# Patient Record
Sex: Male | Born: 1955 | Race: White | Hispanic: No | Marital: Single | Smoking: Former smoker
Health system: Southern US, Community
[De-identification: ages and names within clinical notes are randomized; demographics above are authoritative.]

## PROBLEM LIST (undated history)

## (undated) DIAGNOSIS — J449 Chronic obstructive pulmonary disease, unspecified: Secondary | ICD-10-CM

## (undated) DIAGNOSIS — N400 Enlarged prostate without lower urinary tract symptoms: Secondary | ICD-10-CM

## (undated) DIAGNOSIS — K7460 Unspecified cirrhosis of liver: Secondary | ICD-10-CM

## (undated) DIAGNOSIS — Z8719 Personal history of other diseases of the digestive system: Secondary | ICD-10-CM

## (undated) DIAGNOSIS — I10 Essential (primary) hypertension: Secondary | ICD-10-CM

## (undated) DIAGNOSIS — G9340 Encephalopathy, unspecified: Secondary | ICD-10-CM

## (undated) DIAGNOSIS — E119 Type 2 diabetes mellitus without complications: Secondary | ICD-10-CM

## (undated) DIAGNOSIS — Z1211 Encounter for screening for malignant neoplasm of colon: Secondary | ICD-10-CM

## (undated) DIAGNOSIS — F32A Depression, unspecified: Secondary | ICD-10-CM

## (undated) DIAGNOSIS — G8929 Other chronic pain: Secondary | ICD-10-CM

## (undated) DIAGNOSIS — J9600 Acute respiratory failure, unspecified whether with hypoxia or hypercapnia: Secondary | ICD-10-CM

## (undated) DIAGNOSIS — R0600 Dyspnea, unspecified: Secondary | ICD-10-CM

## (undated) DIAGNOSIS — B182 Chronic viral hepatitis C: Secondary | ICD-10-CM

## (undated) DIAGNOSIS — B1920 Unspecified viral hepatitis C without hepatic coma: Secondary | ICD-10-CM

## (undated) DIAGNOSIS — F329 Major depressive disorder, single episode, unspecified: Secondary | ICD-10-CM

## (undated) DIAGNOSIS — M1990 Unspecified osteoarthritis, unspecified site: Secondary | ICD-10-CM

## (undated) DIAGNOSIS — K219 Gastro-esophageal reflux disease without esophagitis: Secondary | ICD-10-CM

## (undated) DIAGNOSIS — M549 Dorsalgia, unspecified: Secondary | ICD-10-CM

## (undated) DIAGNOSIS — F419 Anxiety disorder, unspecified: Secondary | ICD-10-CM

## (undated) DIAGNOSIS — J45909 Unspecified asthma, uncomplicated: Secondary | ICD-10-CM

## (undated) HISTORY — PX: LIVER BIOPSY: SHX301

## (undated) HISTORY — PX: OTHER SURGICAL HISTORY: SHX169

## (undated) HISTORY — PX: TONSILLECTOMY: SUR1361

---

## 1999-02-10 ENCOUNTER — Emergency Department (HOSPITAL_COMMUNITY): Admission: EM | Admit: 1999-02-10 | Discharge: 1999-02-10 | Payer: Self-pay | Admitting: Emergency Medicine

## 1999-02-17 ENCOUNTER — Emergency Department (HOSPITAL_COMMUNITY): Admission: EM | Admit: 1999-02-17 | Discharge: 1999-02-18 | Payer: Self-pay | Admitting: Family Medicine

## 1999-05-26 ENCOUNTER — Emergency Department (HOSPITAL_COMMUNITY): Admission: EM | Admit: 1999-05-26 | Discharge: 1999-05-27 | Payer: Self-pay

## 1999-05-31 ENCOUNTER — Emergency Department (HOSPITAL_COMMUNITY): Admission: EM | Admit: 1999-05-31 | Discharge: 1999-05-31 | Payer: Self-pay | Admitting: Emergency Medicine

## 1999-11-24 ENCOUNTER — Emergency Department (HOSPITAL_COMMUNITY): Admission: EM | Admit: 1999-11-24 | Discharge: 1999-11-24 | Payer: Self-pay | Admitting: Emergency Medicine

## 1999-11-24 ENCOUNTER — Encounter: Payer: Self-pay | Admitting: Emergency Medicine

## 2006-01-23 ENCOUNTER — Emergency Department (HOSPITAL_COMMUNITY): Admission: EM | Admit: 2006-01-23 | Discharge: 2006-01-23 | Payer: Self-pay | Admitting: Emergency Medicine

## 2011-09-18 ENCOUNTER — Emergency Department: Payer: Self-pay | Admitting: Internal Medicine

## 2014-02-08 ENCOUNTER — Ambulatory Visit: Payer: Self-pay | Admitting: Internal Medicine

## 2014-02-09 ENCOUNTER — Inpatient Hospital Stay: Payer: Self-pay | Admitting: Internal Medicine

## 2014-02-09 LAB — COMPREHENSIVE METABOLIC PANEL
Albumin: 3.1 g/dL — ABNORMAL LOW (ref 3.4–5.0)
Alkaline Phosphatase: 214 U/L — ABNORMAL HIGH
Anion Gap: 10 (ref 7–16)
BUN: 14 mg/dL (ref 7–18)
Bilirubin,Total: 1 mg/dL (ref 0.2–1.0)
Calcium, Total: 9 mg/dL (ref 8.5–10.1)
Chloride: 106 mmol/L (ref 98–107)
Co2: 23 mmol/L (ref 21–32)
Creatinine: 1.05 mg/dL (ref 0.60–1.30)
EGFR (African American): >60
EGFR (Non-African Amer.): >60
Glucose: 135 mg/dL — ABNORMAL HIGH (ref 65–99)
Osmolality: 280 (ref 275–301)
Potassium: 4.5 mmol/L (ref 3.5–5.1)
SGOT(AST): 175 U/L — ABNORMAL HIGH (ref 15–37)
SGPT (ALT): 183 U/L — ABNORMAL HIGH
Sodium: 139 mmol/L (ref 136–145)
Total Protein: 7.9 g/dL (ref 6.4–8.2)

## 2014-02-09 LAB — CBC
HCT: 44 % (ref 40.0–52.0)
HGB: 13.9 g/dL (ref 13.0–18.0)
MCH: 32.7 pg (ref 26.0–34.0)
MCHC: 31.6 g/dL — ABNORMAL LOW (ref 32.0–36.0)
MCV: 104 fL — ABNORMAL HIGH (ref 80–100)
Platelet: 140 10*3/uL — ABNORMAL LOW (ref 150–440)
RBC: 4.25 10*6/uL — ABNORMAL LOW (ref 4.40–5.90)
RDW: 14.2 % (ref 11.5–14.5)
WBC: 12.2 10*3/uL — ABNORMAL HIGH (ref 3.8–10.6)

## 2014-02-09 LAB — URINALYSIS, COMPLETE
BACTERIA: NONE SEEN
BLOOD: NEGATIVE
Bilirubin,UR: NEGATIVE
Glucose,UR: NEGATIVE mg/dL (ref 0–75)
Ketone: NEGATIVE
LEUKOCYTE ESTERASE: NEGATIVE
Nitrite: NEGATIVE
Ph: 6 (ref 4.5–8.0)
Specific Gravity: 1.014 (ref 1.003–1.030)
Squamous Epithelial: NONE SEEN

## 2014-02-09 LAB — PROTIME-INR
INR: 1.2
Prothrombin Time: 14.8 secs — ABNORMAL HIGH (ref 11.5–14.7)

## 2014-02-09 LAB — DRUG SCREEN, URINE
Amphetamines, Ur Screen: NEGATIVE (ref ?–1000)
BENZODIAZEPINE, UR SCRN: POSITIVE (ref ?–200)
Barbiturates, Ur Screen: NEGATIVE (ref ?–200)
CANNABINOID 50 NG, UR ~~LOC~~: NEGATIVE (ref ?–50)
Cocaine Metabolite,Ur ~~LOC~~: POSITIVE (ref ?–300)
MDMA (ECSTASY) UR SCREEN: NEGATIVE (ref ?–500)
METHADONE, UR SCREEN: POSITIVE (ref ?–300)
Opiate, Ur Screen: NEGATIVE (ref ?–300)
Phencyclidine (PCP) Ur S: NEGATIVE (ref ?–25)
Tricyclic, Ur Screen: NEGATIVE (ref ?–1000)

## 2014-02-09 LAB — ACETAMINOPHEN LEVEL: Acetaminophen: <2 ug/mL

## 2014-02-09 LAB — CK TOTAL AND CKMB (NOT AT ARMC)
CK, Total: 252 U/L (ref 39–308)
CK-MB: 8 ng/mL — ABNORMAL HIGH (ref 0.5–3.6)

## 2014-02-09 LAB — HEMOGLOBIN: HGB: 11.8 g/dL — ABNORMAL LOW (ref 13.0–18.0)

## 2014-02-09 LAB — MAGNESIUM: Magnesium: 2.5 mg/dL — ABNORMAL HIGH

## 2014-02-09 LAB — APTT: Activated PTT: 35.4 secs (ref 23.6–35.9)

## 2014-02-09 LAB — SALICYLATE LEVEL: Salicylates, Serum: 2.9 mg/dL — ABNORMAL HIGH

## 2014-02-09 LAB — PHOSPHORUS: PHOSPHORUS: 8.4 mg/dL — AB (ref 2.5–4.9)

## 2014-02-09 LAB — ETHANOL: Ethanol: <3 mg/dL

## 2014-02-10 LAB — CBC WITH DIFFERENTIAL/PLATELET
Basophil #: 0 10*3/uL (ref 0.0–0.1)
Basophil %: 0.2 %
EOS ABS: 0 10*3/uL (ref 0.0–0.7)
Eosinophil %: 0.1 %
HCT: 35 % — ABNORMAL LOW (ref 40.0–52.0)
HGB: 11.8 g/dL — ABNORMAL LOW (ref 13.0–18.0)
LYMPHS PCT: 7.4 %
Lymphocyte #: 0.5 10*3/uL — ABNORMAL LOW (ref 1.0–3.6)
MCH: 33.5 pg (ref 26.0–34.0)
MCHC: 33.9 g/dL (ref 32.0–36.0)
MCV: 99 fL (ref 80–100)
MONO ABS: 0.1 x10 3/mm — AB (ref 0.2–1.0)
Monocyte %: 1.5 %
Neutrophil #: 6.3 10*3/uL (ref 1.4–6.5)
Neutrophil %: 90.8 %
Platelet: 91 10*3/uL — ABNORMAL LOW (ref 150–440)
RBC: 3.54 10*6/uL — ABNORMAL LOW (ref 4.40–5.90)
RDW: 13.4 % (ref 11.5–14.5)
WBC: 6.9 10*3/uL (ref 3.8–10.6)

## 2014-02-10 LAB — BASIC METABOLIC PANEL
Anion Gap: 8 (ref 7–16)
BUN: 25 mg/dL — ABNORMAL HIGH (ref 7–18)
CHLORIDE: 108 mmol/L — AB (ref 98–107)
CO2: 27 mmol/L (ref 21–32)
Calcium, Total: 7.8 mg/dL — ABNORMAL LOW (ref 8.5–10.1)
Creatinine: 1.02 mg/dL (ref 0.60–1.30)
EGFR (African American): >60
EGFR (Non-African Amer.): >60
GLUCOSE: 141 mg/dL — AB (ref 65–99)
Osmolality: 292 (ref 275–301)
Potassium: 4 mmol/L (ref 3.5–5.1)
Sodium: 143 mmol/L (ref 136–145)

## 2014-02-11 LAB — CBC WITH DIFFERENTIAL/PLATELET
BASOS ABS: 0 10*3/uL (ref 0.0–0.1)
Basophil %: 0.2 %
Eosinophil #: 0 10*3/uL (ref 0.0–0.7)
Eosinophil %: 0.1 %
HCT: 34.6 % — AB (ref 40.0–52.0)
HGB: 11.7 g/dL — ABNORMAL LOW (ref 13.0–18.0)
Lymphocyte #: 0.5 10*3/uL — ABNORMAL LOW (ref 1.0–3.6)
Lymphocyte %: 2.9 %
MCH: 34.4 pg — ABNORMAL HIGH (ref 26.0–34.0)
MCHC: 33.8 g/dL (ref 32.0–36.0)
MCV: 102 fL — ABNORMAL HIGH (ref 80–100)
MONOS PCT: 2.5 %
Monocyte #: 0.4 x10 3/mm (ref 0.2–1.0)
NEUTROS ABS: 15.4 10*3/uL — AB (ref 1.4–6.5)
Neutrophil %: 94.3 %
Platelet: 104 10*3/uL — ABNORMAL LOW (ref 150–440)
RBC: 3.4 10*6/uL — ABNORMAL LOW (ref 4.40–5.90)
RDW: 13.9 % (ref 11.5–14.5)
WBC: 16.3 10*3/uL — ABNORMAL HIGH (ref 3.8–10.6)

## 2014-02-11 LAB — COMPREHENSIVE METABOLIC PANEL
ALK PHOS: 87 U/L
ALT: 119 U/L — AB
Albumin: 2.2 g/dL — ABNORMAL LOW (ref 3.4–5.0)
Anion Gap: 13 (ref 7–16)
BILIRUBIN TOTAL: 1.1 mg/dL — AB (ref 0.2–1.0)
BUN: 26 mg/dL — AB (ref 7–18)
CALCIUM: 7.1 mg/dL — AB (ref 8.5–10.1)
CHLORIDE: 107 mmol/L (ref 98–107)
CO2: 20 mmol/L — AB (ref 21–32)
Creatinine: 0.98 mg/dL (ref 0.60–1.30)
EGFR (African American): >60
GLUCOSE: 152 mg/dL — AB (ref 65–99)
Osmolality: 287 (ref 275–301)
POTASSIUM: 4 mmol/L (ref 3.5–5.1)
SGOT(AST): 107 U/L — ABNORMAL HIGH (ref 15–37)
SODIUM: 140 mmol/L (ref 136–145)
Total Protein: 5.8 g/dL — ABNORMAL LOW (ref 6.4–8.2)

## 2014-02-11 LAB — OCCULT BLOOD X 1 CARD TO LAB, STOOL: OCCULT BLOOD, FECES: POSITIVE

## 2014-02-11 LAB — MAGNESIUM: Magnesium: 3 mg/dL — ABNORMAL HIGH

## 2014-02-11 LAB — PHOSPHORUS: Phosphorus: 2.7 mg/dL (ref 2.5–4.9)

## 2014-02-11 LAB — VANCOMYCIN, TROUGH: Vancomycin, Trough: 16 ug/mL (ref 10–20)

## 2014-02-11 LAB — TRIGLYCERIDES: Triglycerides: 1147 mg/dL — ABNORMAL HIGH (ref 0–200)

## 2014-02-12 LAB — COMPREHENSIVE METABOLIC PANEL
ALK PHOS: 86 U/L
ALT: 114 U/L — AB
ANION GAP: 6 — AB (ref 7–16)
AST: 74 U/L — AB (ref 15–37)
Albumin: 2.3 g/dL — ABNORMAL LOW (ref 3.4–5.0)
BUN: 32 mg/dL — AB (ref 7–18)
Bilirubin,Total: 1.1 mg/dL — ABNORMAL HIGH (ref 0.2–1.0)
CHLORIDE: 109 mmol/L — AB (ref 98–107)
CO2: 28 mmol/L (ref 21–32)
CREATININE: 0.9 mg/dL (ref 0.60–1.30)
Calcium, Total: 7.8 mg/dL — ABNORMAL LOW (ref 8.5–10.1)
EGFR (African American): >60
GLUCOSE: 124 mg/dL — AB (ref 65–99)
Osmolality: 293 (ref 275–301)
Potassium: 4.3 mmol/L (ref 3.5–5.1)
Sodium: 143 mmol/L (ref 136–145)
Total Protein: 6.1 g/dL — ABNORMAL LOW (ref 6.4–8.2)

## 2014-02-12 LAB — CBC WITH DIFFERENTIAL/PLATELET
BASOS PCT: 0.5 %
Basophil #: 0.1 10*3/uL (ref 0.0–0.1)
Eosinophil #: 0 10*3/uL (ref 0.0–0.7)
Eosinophil %: 0 %
HCT: 35.4 % — AB (ref 40.0–52.0)
HGB: 11.5 g/dL — ABNORMAL LOW (ref 13.0–18.0)
LYMPHS ABS: 0.7 10*3/uL — AB (ref 1.0–3.6)
Lymphocyte %: 5.2 %
MCH: 32.8 pg (ref 26.0–34.0)
MCHC: 32.5 g/dL (ref 32.0–36.0)
MCV: 101 fL — ABNORMAL HIGH (ref 80–100)
Monocyte #: 0.9 x10 3/mm (ref 0.2–1.0)
Monocyte %: 6.5 %
NEUTROS ABS: 12.6 10*3/uL — AB (ref 1.4–6.5)
Neutrophil %: 87.8 %
PLATELETS: 96 10*3/uL — AB (ref 150–440)
RBC: 3.5 10*6/uL — ABNORMAL LOW (ref 4.40–5.90)
RDW: 14.1 % (ref 11.5–14.5)
WBC: 14.3 10*3/uL — ABNORMAL HIGH (ref 3.8–10.6)

## 2014-02-12 LAB — PHOSPHORUS: Phosphorus: 3.1 mg/dL (ref 2.5–4.9)

## 2014-02-12 LAB — MAGNESIUM: Magnesium: 2.4 mg/dL

## 2014-02-13 LAB — CBC WITH DIFFERENTIAL/PLATELET
BASOS ABS: 0.1 10*3/uL (ref 0.0–0.1)
Basophil %: 0.6 %
EOS ABS: 0 10*3/uL (ref 0.0–0.7)
EOS PCT: 0.2 %
HCT: 36.8 % — AB (ref 40.0–52.0)
HGB: 12.2 g/dL — ABNORMAL LOW (ref 13.0–18.0)
LYMPHS ABS: 1.8 10*3/uL (ref 1.0–3.6)
LYMPHS PCT: 19.6 %
MCH: 33.2 pg (ref 26.0–34.0)
MCHC: 33 g/dL (ref 32.0–36.0)
MCV: 101 fL — ABNORMAL HIGH (ref 80–100)
Monocyte #: 1.1 x10 3/mm — ABNORMAL HIGH (ref 0.2–1.0)
Monocyte %: 12.5 %
NEUTROS PCT: 67.1 %
Neutrophil #: 6 10*3/uL (ref 1.4–6.5)
Platelet: 91 10*3/uL — ABNORMAL LOW (ref 150–440)
RBC: 3.66 10*6/uL — ABNORMAL LOW (ref 4.40–5.90)
RDW: 14 % (ref 11.5–14.5)
WBC: 9 10*3/uL (ref 3.8–10.6)

## 2014-02-13 LAB — TRIGLYCERIDES: TRIGLYCERIDES: 63 mg/dL (ref 0–200)

## 2014-02-13 LAB — EXPECTORATED SPUTUM ASSESSMENT W REFEX TO RESP CULTURE

## 2014-02-14 LAB — COMPREHENSIVE METABOLIC PANEL
Albumin: 2.2 g/dL — ABNORMAL LOW (ref 3.4–5.0)
Alkaline Phosphatase: 94 U/L
Anion Gap: 6 — ABNORMAL LOW (ref 7–16)
BUN: 25 mg/dL — ABNORMAL HIGH (ref 7–18)
Bilirubin,Total: 1.2 mg/dL — ABNORMAL HIGH (ref 0.2–1.0)
Calcium, Total: 7.8 mg/dL — ABNORMAL LOW (ref 8.5–10.1)
Chloride: 109 mmol/L — ABNORMAL HIGH (ref 98–107)
Co2: 29 mmol/L (ref 21–32)
Creatinine: 0.72 mg/dL (ref 0.60–1.30)
EGFR (African American): >60
EGFR (Non-African Amer.): >60
Glucose: 155 mg/dL — ABNORMAL HIGH (ref 65–99)
Osmolality: 294 (ref 275–301)
Potassium: 4.3 mmol/L (ref 3.5–5.1)
SGOT(AST): 66 U/L — ABNORMAL HIGH (ref 15–37)
SGPT (ALT): 105 U/L — ABNORMAL HIGH
Sodium: 144 mmol/L (ref 136–145)
Total Protein: 6 g/dL — ABNORMAL LOW (ref 6.4–8.2)

## 2014-02-14 LAB — CULTURE, BLOOD (SINGLE)

## 2014-02-14 LAB — MAGNESIUM: Magnesium: 1.9 mg/dL

## 2014-02-15 LAB — BASIC METABOLIC PANEL
ANION GAP: 3 — AB (ref 7–16)
BUN: 20 mg/dL — ABNORMAL HIGH (ref 7–18)
CALCIUM: 7.8 mg/dL — AB (ref 8.5–10.1)
CHLORIDE: 108 mmol/L — AB (ref 98–107)
CO2: 29 mmol/L (ref 21–32)
Creatinine: 0.64 mg/dL (ref 0.60–1.30)
EGFR (African American): >60
EGFR (Non-African Amer.): >60
Glucose: 129 mg/dL — ABNORMAL HIGH (ref 65–99)
OSMOLALITY: 284 (ref 275–301)
POTASSIUM: 4.1 mmol/L (ref 3.5–5.1)
Sodium: 140 mmol/L (ref 136–145)

## 2014-02-15 LAB — MAGNESIUM: Magnesium: 1.8 mg/dL

## 2014-02-15 LAB — AMMONIA: Ammonia, Plasma: 102 mcmol/L — ABNORMAL HIGH (ref 11–32)

## 2014-02-16 LAB — CBC WITH DIFFERENTIAL/PLATELET
BASOS ABS: 0.1 10*3/uL (ref 0.0–0.1)
Basophil %: 0.5 %
EOS ABS: 0.4 10*3/uL (ref 0.0–0.7)
EOS PCT: 3.3 %
HCT: 38.8 % — ABNORMAL LOW (ref 40.0–52.0)
HGB: 12.7 g/dL — AB (ref 13.0–18.0)
LYMPHS PCT: 10.8 %
Lymphocyte #: 1.4 10*3/uL (ref 1.0–3.6)
MCH: 33.2 pg (ref 26.0–34.0)
MCHC: 32.6 g/dL (ref 32.0–36.0)
MCV: 102 fL — AB (ref 80–100)
MONOS PCT: 10.2 %
Monocyte #: 1.3 x10 3/mm — ABNORMAL HIGH (ref 0.2–1.0)
NEUTROS ABS: 9.5 10*3/uL — AB (ref 1.4–6.5)
Neutrophil %: 75.2 %
Platelet: 103 10*3/uL — ABNORMAL LOW (ref 150–440)
RBC: 3.82 10*6/uL — ABNORMAL LOW (ref 4.40–5.90)
RDW: 13.8 % (ref 11.5–14.5)
WBC: 12.6 10*3/uL — AB (ref 3.8–10.6)

## 2014-02-16 LAB — CREATININE, SERUM
Creatinine: 0.75 mg/dL (ref 0.60–1.30)
EGFR (African American): >60
EGFR (Non-African Amer.): >60

## 2014-02-16 LAB — AMMONIA: Ammonia, Plasma: 60 mcmol/L — ABNORMAL HIGH (ref 11–32)

## 2014-02-17 LAB — CREATININE, SERUM
Creatinine: 0.68 mg/dL (ref 0.60–1.30)
EGFR (Non-African Amer.): >60

## 2014-02-19 LAB — COMPREHENSIVE METABOLIC PANEL
ALT: 91 U/L — AB
Albumin: 2 g/dL — ABNORMAL LOW (ref 3.4–5.0)
Alkaline Phosphatase: 101 U/L
Anion Gap: 7 (ref 7–16)
BUN: 33 mg/dL — ABNORMAL HIGH (ref 7–18)
Bilirubin,Total: 1.1 mg/dL — ABNORMAL HIGH (ref 0.2–1.0)
Calcium, Total: 7.8 mg/dL — ABNORMAL LOW (ref 8.5–10.1)
Chloride: 113 mmol/L — ABNORMAL HIGH (ref 98–107)
Co2: 24 mmol/L (ref 21–32)
Creatinine: 1.27 mg/dL (ref 0.60–1.30)
EGFR (African American): >60
EGFR (Non-African Amer.): >60
Glucose: 154 mg/dL — ABNORMAL HIGH (ref 65–99)
Osmolality: 297 (ref 275–301)
Potassium: 3.5 mmol/L (ref 3.5–5.1)
SGOT(AST): 76 U/L — ABNORMAL HIGH (ref 15–37)
Sodium: 144 mmol/L (ref 136–145)
TOTAL PROTEIN: 5.8 g/dL — AB (ref 6.4–8.2)

## 2014-02-19 LAB — CBC WITH DIFFERENTIAL/PLATELET
Basophil #: 0 10*3/uL (ref 0.0–0.1)
Basophil %: 0.4 %
Eosinophil #: 0.4 10*3/uL (ref 0.0–0.7)
Eosinophil %: 4.4 %
HCT: 38.9 % — ABNORMAL LOW (ref 40.0–52.0)
HGB: 13 g/dL (ref 13.0–18.0)
LYMPHS ABS: 1.3 10*3/uL (ref 1.0–3.6)
Lymphocyte %: 12.9 %
MCH: 34 pg (ref 26.0–34.0)
MCHC: 33.5 g/dL (ref 32.0–36.0)
MCV: 102 fL — ABNORMAL HIGH (ref 80–100)
MONOS PCT: 12.3 %
Monocyte #: 1.2 x10 3/mm — ABNORMAL HIGH (ref 0.2–1.0)
Neutrophil #: 7.1 10*3/uL — ABNORMAL HIGH (ref 1.4–6.5)
Neutrophil %: 70 %
Platelet: 89 10*3/uL — ABNORMAL LOW (ref 150–440)
RBC: 3.83 10*6/uL — AB (ref 4.40–5.90)
RDW: 13.8 % (ref 11.5–14.5)
WBC: 10.1 10*3/uL (ref 3.8–10.6)

## 2014-02-19 LAB — MAGNESIUM: MAGNESIUM: 1.9 mg/dL

## 2014-02-20 LAB — COMPREHENSIVE METABOLIC PANEL
ANION GAP: 4 — AB (ref 7–16)
AST: 89 U/L — AB (ref 15–37)
Albumin: 1.9 g/dL — ABNORMAL LOW (ref 3.4–5.0)
Alkaline Phosphatase: 96 U/L
BUN: 22 mg/dL — AB (ref 7–18)
Bilirubin,Total: 0.8 mg/dL (ref 0.2–1.0)
CALCIUM: 7.2 mg/dL — AB (ref 8.5–10.1)
CO2: 23 mmol/L (ref 21–32)
CREATININE: 1.14 mg/dL (ref 0.60–1.30)
Chloride: 122 mmol/L — ABNORMAL HIGH (ref 98–107)
EGFR (Non-African Amer.): >60
GLUCOSE: 401 mg/dL — AB (ref 65–99)
Osmolality: 316 (ref 275–301)
Potassium: 5.1 mmol/L (ref 3.5–5.1)
SGPT (ALT): 100 U/L — ABNORMAL HIGH
SODIUM: 149 mmol/L — AB (ref 136–145)
Total Protein: 5.1 g/dL — ABNORMAL LOW (ref 6.4–8.2)

## 2014-02-20 LAB — PLATELET COUNT: PLATELETS: 82 10*3/uL — AB (ref 150–440)

## 2014-02-21 LAB — COMPREHENSIVE METABOLIC PANEL
ALBUMIN: 2 g/dL — AB (ref 3.4–5.0)
ALK PHOS: 139 U/L — AB
ALT: 113 U/L — AB
ANION GAP: 4 — AB (ref 7–16)
AST: 87 U/L — AB (ref 15–37)
BUN: 18 mg/dL (ref 7–18)
Bilirubin,Total: 0.9 mg/dL (ref 0.2–1.0)
CREATININE: 1.09 mg/dL (ref 0.60–1.30)
Calcium, Total: 8.3 mg/dL — ABNORMAL LOW (ref 8.5–10.1)
Chloride: 115 mmol/L — ABNORMAL HIGH (ref 98–107)
Co2: 26 mmol/L (ref 21–32)
EGFR (African American): >60
EGFR (Non-African Amer.): >60
GLUCOSE: 155 mg/dL — AB (ref 65–99)
OSMOLALITY: 294 (ref 275–301)
Potassium: 3.7 mmol/L (ref 3.5–5.1)
Sodium: 145 mmol/L (ref 136–145)
Total Protein: 5.8 g/dL — ABNORMAL LOW (ref 6.4–8.2)

## 2014-02-21 LAB — HEMOGLOBIN: HGB: 13.1 g/dL (ref 13.0–18.0)

## 2014-02-22 LAB — PROTIME-INR
INR: 1.2
Prothrombin Time: 15.4 secs — ABNORMAL HIGH (ref 11.5–14.7)

## 2014-02-23 LAB — CBC WITH DIFFERENTIAL/PLATELET
BASOS ABS: 0.1 10*3/uL (ref 0.0–0.1)
Basophil %: 0.7 %
Eosinophil #: 0.2 10*3/uL (ref 0.0–0.7)
Eosinophil %: 1 %
HCT: 42.3 % (ref 40.0–52.0)
HGB: 14.1 g/dL (ref 13.0–18.0)
LYMPHS ABS: 1.5 10*3/uL (ref 1.0–3.6)
LYMPHS PCT: 8.9 %
MCH: 33.4 pg (ref 26.0–34.0)
MCHC: 33.3 g/dL (ref 32.0–36.0)
MCV: 100 fL (ref 80–100)
MONO ABS: 1.3 x10 3/mm — AB (ref 0.2–1.0)
Monocyte %: 7.8 %
Neutrophil #: 13.5 10*3/uL — ABNORMAL HIGH (ref 1.4–6.5)
Neutrophil %: 81.6 %
PLATELETS: 98 10*3/uL — AB (ref 150–440)
RBC: 4.22 10*6/uL — ABNORMAL LOW (ref 4.40–5.90)
RDW: 13.8 % (ref 11.5–14.5)
WBC: 16.5 10*3/uL — AB (ref 3.8–10.6)

## 2014-02-24 LAB — COMPREHENSIVE METABOLIC PANEL
ALBUMIN: 1.9 g/dL — AB (ref 3.4–5.0)
ALT: 137 U/L — AB
Alkaline Phosphatase: 159 U/L — ABNORMAL HIGH
Anion Gap: 7 (ref 7–16)
BUN: 13 mg/dL (ref 7–18)
Bilirubin,Total: 1.7 mg/dL — ABNORMAL HIGH (ref 0.2–1.0)
CALCIUM: 7.4 mg/dL — AB (ref 8.5–10.1)
CO2: 30 mmol/L (ref 21–32)
Chloride: 92 mmol/L — ABNORMAL LOW (ref 98–107)
Creatinine: 1.16 mg/dL (ref 0.60–1.30)
EGFR (African American): >60
GLUCOSE: 551 mg/dL — AB (ref 65–99)
Osmolality: 284 (ref 275–301)
Potassium: 2.8 mmol/L — ABNORMAL LOW (ref 3.5–5.1)
SGOT(AST): 141 U/L — ABNORMAL HIGH (ref 15–37)
Sodium: 129 mmol/L — ABNORMAL LOW (ref 136–145)
Total Protein: 5.4 g/dL — ABNORMAL LOW (ref 6.4–8.2)

## 2014-02-24 LAB — MAGNESIUM: MAGNESIUM: 1.3 mg/dL — AB

## 2014-02-25 LAB — COMPREHENSIVE METABOLIC PANEL
ALT: 159 U/L — AB
AST: 162 U/L — AB (ref 15–37)
Albumin: 2.1 g/dL — ABNORMAL LOW (ref 3.4–5.0)
Alkaline Phosphatase: 176 U/L — ABNORMAL HIGH
Anion Gap: 6 — ABNORMAL LOW (ref 7–16)
BUN: 15 mg/dL (ref 7–18)
Bilirubin,Total: 1.7 mg/dL — ABNORMAL HIGH (ref 0.2–1.0)
CREATININE: 1.29 mg/dL (ref 0.60–1.30)
Calcium, Total: 8.4 mg/dL — ABNORMAL LOW (ref 8.5–10.1)
Chloride: 107 mmol/L (ref 98–107)
Co2: 33 mmol/L — ABNORMAL HIGH (ref 21–32)
EGFR (Non-African Amer.): >60
Glucose: 81 mg/dL (ref 65–99)
Osmolality: 290 (ref 275–301)
POTASSIUM: 3.5 mmol/L (ref 3.5–5.1)
Sodium: 146 mmol/L — ABNORMAL HIGH (ref 136–145)
TOTAL PROTEIN: 6 g/dL — AB (ref 6.4–8.2)

## 2014-02-25 LAB — CBC WITH DIFFERENTIAL/PLATELET
Basophil #: 0.1 10*3/uL (ref 0.0–0.1)
Basophil %: 0.8 %
EOS PCT: 5 %
Eosinophil #: 0.6 10*3/uL (ref 0.0–0.7)
HCT: 41.2 % (ref 40.0–52.0)
HGB: 13.6 g/dL (ref 13.0–18.0)
Lymphocyte #: 2.8 10*3/uL (ref 1.0–3.6)
Lymphocyte %: 25.6 %
MCH: 33.3 pg (ref 26.0–34.0)
MCHC: 33 g/dL (ref 32.0–36.0)
MCV: 101 fL — AB (ref 80–100)
MONOS PCT: 13.6 %
Monocyte #: 1.5 x10 3/mm — ABNORMAL HIGH (ref 0.2–1.0)
Neutrophil #: 6 10*3/uL (ref 1.4–6.5)
Neutrophil %: 55 %
Platelet: 93 10*3/uL — ABNORMAL LOW (ref 150–440)
RBC: 4.07 10*6/uL — ABNORMAL LOW (ref 4.40–5.90)
RDW: 13.6 % (ref 11.5–14.5)
WBC: 11 10*3/uL — AB (ref 3.8–10.6)

## 2014-03-01 LAB — BASIC METABOLIC PANEL
ANION GAP: 3 — AB (ref 7–16)
BUN: 23 mg/dL — ABNORMAL HIGH (ref 7–18)
CHLORIDE: 103 mmol/L (ref 98–107)
Calcium, Total: 7.9 mg/dL — ABNORMAL LOW (ref 8.5–10.1)
Co2: 35 mmol/L — ABNORMAL HIGH (ref 21–32)
Creatinine: 1.02 mg/dL (ref 0.60–1.30)
EGFR (African American): >60
EGFR (Non-African Amer.): >60
GLUCOSE: 94 mg/dL (ref 65–99)
Osmolality: 285 (ref 275–301)
Potassium: 4.4 mmol/L (ref 3.5–5.1)
Sodium: 141 mmol/L (ref 136–145)

## 2014-03-11 ENCOUNTER — Ambulatory Visit: Payer: Self-pay | Admitting: Internal Medicine

## 2014-04-27 ENCOUNTER — Emergency Department: Payer: Self-pay | Admitting: Emergency Medicine

## 2014-06-13 ENCOUNTER — Inpatient Hospital Stay
Admit: 2014-06-13 | Disposition: A | Discharge status: Home / Self Care | Payer: Self-pay | Attending: Internal Medicine | Admitting: Internal Medicine

## 2014-06-13 DIAGNOSIS — I340 Nonrheumatic mitral (valve) insufficiency: Secondary | ICD-10-CM

## 2014-06-13 LAB — CBC
HCT: 41.7 % (ref 40.0–52.0)
HGB: 13.4 g/dL (ref 13.0–18.0)
MCH: 32.4 pg (ref 26.0–34.0)
MCHC: 32.1 g/dL (ref 32.0–36.0)
MCV: 101 fL — AB (ref 80–100)
Platelet: 122 10*3/uL — ABNORMAL LOW (ref 150–440)
RBC: 4.14 10*6/uL — ABNORMAL LOW (ref 4.40–5.90)
RDW: 14.2 % (ref 11.5–14.5)
WBC: 18.2 10*3/uL — ABNORMAL HIGH (ref 3.8–10.6)

## 2014-06-13 LAB — COMPREHENSIVE METABOLIC PANEL
Albumin: 3.4 g/dL — ABNORMAL LOW
Alkaline Phosphatase: 124 U/L
Anion Gap: 5 — ABNORMAL LOW (ref 7–16)
BILIRUBIN TOTAL: 1.2 mg/dL
BUN: 18 mg/dL
CALCIUM: 8.9 mg/dL
CHLORIDE: 109 mmol/L
Co2: 28 mmol/L
Creatinine: 0.79 mg/dL
EGFR (Non-African Amer.): >60
Glucose: 133 mg/dL — ABNORMAL HIGH
Potassium: 5.4 mmol/L — ABNORMAL HIGH
SGOT(AST): 166 U/L — ABNORMAL HIGH
SGPT (ALT): 191 U/L — ABNORMAL HIGH
SODIUM: 142 mmol/L
Total Protein: 7.1 g/dL

## 2014-06-13 LAB — TROPONIN I
Troponin-I: 0.08 ng/mL — ABNORMAL HIGH
Troponin-I: 0.04 ng/mL — ABNORMAL HIGH
Troponin-I: 0.03 ng/mL

## 2014-06-14 LAB — COMPREHENSIVE METABOLIC PANEL
ALT: 137 U/L — AB
Albumin: 2.6 g/dL — ABNORMAL LOW
Alkaline Phosphatase: 105 U/L
Anion Gap: 9 (ref 7–16)
BUN: 29 mg/dL — ABNORMAL HIGH
Bilirubin,Total: 0.6 mg/dL
CALCIUM: 8.5 mg/dL — AB
CREATININE: 0.8 mg/dL
Chloride: 102 mmol/L
Co2: 25 mmol/L
EGFR (African American): >60
EGFR (Non-African Amer.): >60
GLUCOSE: 137 mg/dL — AB
POTASSIUM: 3.3 mmol/L — AB
SGOT(AST): 107 U/L — ABNORMAL HIGH
Sodium: 136 mmol/L
Total Protein: 6.2 g/dL — ABNORMAL LOW

## 2014-06-14 LAB — CBC WITH DIFFERENTIAL/PLATELET
Basophil #: 0 10*3/uL (ref 0.0–0.1)
Basophil %: 0.2 %
EOS ABS: 0 10*3/uL (ref 0.0–0.7)
Eosinophil %: 0 %
HCT: 39.1 % — ABNORMAL LOW (ref 40.0–52.0)
HGB: 13 g/dL (ref 13.0–18.0)
LYMPHS PCT: 12.1 %
Lymphocyte #: 1.6 10*3/uL (ref 1.0–3.6)
MCH: 33.4 pg (ref 26.0–34.0)
MCHC: 33.4 g/dL (ref 32.0–36.0)
MCV: 100 fL (ref 80–100)
MONOS PCT: 1.6 %
Monocyte #: 0.2 x10 3/mm (ref 0.2–1.0)
Neutrophil #: 11.6 10*3/uL — ABNORMAL HIGH (ref 1.4–6.5)
Neutrophil %: 86.1 %
Platelet: 112 10*3/uL — ABNORMAL LOW (ref 150–440)
RBC: 3.91 10*6/uL — ABNORMAL LOW (ref 4.40–5.90)
RDW: 13.6 % (ref 11.5–14.5)
WBC: 13.5 10*3/uL — ABNORMAL HIGH (ref 3.8–10.6)

## 2014-06-15 LAB — EXPECTORATED SPUTUM ASSESSMENT W GRAM STAIN, RFLX TO RESP C

## 2014-06-18 LAB — CULTURE, BLOOD (SINGLE)

## 2014-07-02 NOTE — Consult Note (Signed)
Brief Consult Note: Comments: Psychiatry: Consult only received this afternoon. Will review and complete consult this afternoon.  Electronic Signatures: Izaya Netherton, Madie Reno (MD)  (Signed 08-Dec-15 13:10)  Authored: Brief Consult Note   Last Updated: 08-Dec-15 13:10 by Gonzella Lex (MD)

## 2014-07-02 NOTE — H&P (Signed)
PATIENT NAME:  [name], [name] MR#:  [id number] DATE OF BIRTH:  Dec 01, 1955  DATE OF ADMISSION:  12020/08/1113  ADDENDUM:  I was able to speak with the family. He has been using crack cocaine for a while now. The patient has a history of hepatitis C, cirrhosis, chronic pain on methadone for years, crack cocaine abuser, also has diabetes, and he also takes benzodiazepines.   I did speak with ENT, Dr. Richardson Landry does not believe that the blood is coming from the nose, the nurse was able to suction from the ET tube, got out gross blood, this could be a pulmonary hemorrhage from the crack cocaine abuse. Supportive care on the ventilator support will be needed. I will order steroids and Combivent and Flovent for right now. Overall prognosis is poor. Continue supportive care at this point. Recheck an ABG at 1:00, further ventilation settings will be done from there.    Another 15 minutes spent on the patient's case, critically ill.     ____________________________ Tana Conch. Leslye Peer, MD rjw:bu D: 12020/08/1113 13:26:43 ET T: 12020/08/1113 13:55:34 ET JOB#: [id number]  cc: Tana Conch. Leslye Peer, MD, <Dictator> Marisue Brooklyn MD ELECTRONICALLY SIGNED 02/09/2014 14:26

## 2014-07-02 NOTE — Consult Note (Signed)
Psychiatry: Follow-up 59 year old man with delirium and difficulty weaning off of his drips.  Opiate abuse.  Cirrhosis.  Today the patient was sleepy and did not wake up readily.  It appears that further progress has been made in getting him off of most of his sedating drips. Gradual progress being made.  Tolerating current standing doses of antipsychotics.  No change to medications I've ordered.  Nursing will continue working on weaning him off of Precedex.  Electronic Signatures: Khush Pasion, Madie Reno (MD)  (Signed on 12-Dec-15 12:31)  Authored  Last Updated: 12-Dec-15 12:31 by Gonzella Lex (MD)

## 2014-07-02 NOTE — Consult Note (Signed)
   Comments   I spoke with pt's sister, Severiano Gilbert, by phone. Updated her on pt's current condition. Sister is very aware of pt's poor health and ongoing drug use. She does not feel that he can return home to live with his elderly mother and father who has dementia but says that, if pt is able to make his own decisions, he will likely opt to do that. If pt is unable to make decisions, sister feels he would be best served by going to SNF for longterm care.  discussed feeding tube and sister would like to see how pt does over time now that he is on pureed diet and taking in some po.  discussed code status. Pt had another brother who went through a critical illness and was on vent and pt has said that he would never want that for himself. However, sister does not feel that she can make pt a DNR at this time. We agreed that I could discuss with pt when/if he is able.  expressed appreciation for call. All questions answered.   Electronic Signatures: Dareth Andrew, Izora Gala (MD)  (Signed 16-Dec-15 12:16)  Authored: Palliative Care   Last Updated: 16-Dec-15 12:16 by Rakayla Ricklefs, Izora Gala (MD)

## 2014-07-02 NOTE — Consult Note (Signed)
PATIENT NAME:  [name], [name] MR#:  [id number] DATE OF BIRTH:  06/01/1955  DATE OF CONSULTATION:  12020/05/813  REFERRING PHYSICIAN:   CONSULTING PHYSICIAN:  Sammuel Hines. Richardson Landry, MD  REFERRING PHYSICIAN: Dr. Leslye Peer.   CONSULTING PHYSICIAN: Dr. Jill Poling.   REASON FOR CONSULTATION: Evaluate source of bleeding.   HISTORY OF PRESENT ILLNESS: A 59 year old male who was brought in the Emergency Room after collapsing at home, having been smoking crack cocaine throughout the last evening. On admission to the Emergency Room he had to be ultimately intubated for respiratory distress and at the time of intubation, blood was noted in the oropharynx: There was also some blood in the right naris. He has not had any active bleeding from the nose since. History is otherwise limited so it is not certain whether he may have had any previous issues with nosebleed.   PAST MEDICAL HISTORY: Long history of substance abuse, diabetes mellitus.    HOME MEDICATIONS:  Not available.   ALLERGIES: None.   SOCIAL HISTORY: The patient is a daily smoker with a long history of substance abuse.    REVIEW OF SYSTEMS: Not obtainable as the patient is currently intubated.   PHYSICAL EXAMINATION:  VITAL SIGNS: Temperature 97.4, pulse 86, blood pressure 86/47.  GENERAL: A thin male, intubated and sedated.   HEAD AND FACE: Head is normocephalic, atraumatic. There are no facial skin lesions.  EARS: External ears, ear canals, tympanic membranes are clear bilaterally.  NOSE: External nose is unremarkable although there is some dried blood around the nose and mouth area. Intranasally the septum is straight and intact with no perforation and no excoriation of the nasal mucosa to suggest recent source for nose bleed. There is no blood in the nasal cavity on either side currently, just some clear mucus.  ORAL CAVITY AND OROPHARYNX: Lips, gums, tongue, and floor of mouth are unremarkable, although exam was a little limited  working around the endotracheal tube to examine, but no source of bleeding was seen in the oral cavity. There was a small amount of old blood in the posterior pharynx, but no source of active bleeding and there is no bleeding down the back of the nose from the nasopharyngeal area.  NECK: Neck is supple without adenopathy or masses, no thyromegaly. Salivary glands are soft and nontender without masses.  NEUROLOGIC:  Not obtainable.   DATA REVIEW: CT scan of the chest shows advanced COPD with acute infiltrates in the right middle lobe, right lower lobe, and lingula. There was also some abnormality in the mediastinum though unclear whether this might be lymph node tissue or some soft tissue fullness in the mid esophagus.    The nursing staff noted during my evaluation that he sounded like he had secretions in his chest. On suctioning his endotracheal tube they noted bright red blood from the endotracheal tube itself. Per report of the Emergency Room physician the vocal cords were unremarkable at time of intubation with no source of bleeding there.   ASSESSMENT: This patient has no obvious source of bleeding from the upper airway. Bright red blood however was suctioned from the endotracheal tube, so a pulmonary source would be the most likely source for the blood seen in the oropharynx. Certainly if there is future bleeding from the nose, further evaluation could be considered but I do not see any lesions in the nasal cavity or source of bleeding and with no active bleeding nasal endoscopy would be of limited value, particularly since a  likely pulmonary source has now been identified.   PLAN:  Happy to reconsult if necessary. Obviously pulmonology will need to be involved in this patient's care to evaluate the hemoptysis further.     ____________________________ Sammuel Hines. Richardson Landry, MD psb:bu D: 112/28/202015 13:06:32 ET T: 112/28/202015 14:22:26 ET JOB#: [id number]  cc: Sammuel Hines. Richardson Landry, MD, <Dictator> Riley Nearing MD ELECTRONICALLY SIGNED 02/27/2014 13:58

## 2014-07-02 NOTE — Consult Note (Signed)
Psychiatry: Follow-up 59 year old gentleman recovering from extended hospitalization.  Difficulty weaning off of his drips of sedating medicine.  Patient today was calm when I came to see him.  Seemed a little more sedated.  By report he still has episodes of agitation.  Still getting his Precedex drip. not able to provide review of systems. awake still somewhat confused. is still getting Geodon 10 mg intramuscular twice a day as well as Haldol IV 2 mg every 6.  I would continue current medicine with continued plan to gradually decrease his Precedex drip.  No change to plan for today.  Electronic Signatures: Clapacs, Madie Reno (MD)  (Signed on 13-Dec-15 13:19)  Authored  Last Updated: 13-Dec-15 13:19 by Gonzella Lex (MD)

## 2014-07-02 NOTE — Consult Note (Signed)
Psychiatry: Follow-up for this patient recovering from delirium.  Today he was awake and alert and oriented.  Able to hold eye contact and carry on a lucid conversation.  Sitting up out of bed and eating on his own.  Still sedated and slow and confused a little bit but much better than he was before.  Might still get delirious at night.  Not changing any medicine as of right now although over the weekend if taper seems appropriate would go ahead and decrease medicine.  I will follow-up after the weekend.  Electronic Signatures: Catrell Morrone, Madie Reno (MD)  (Signed on 18-Dec-15 17:27)  Authored  Last Updated: 18-Dec-15 17:27 by Gonzella Lex (MD)

## 2014-07-02 NOTE — Consult Note (Signed)
Psychiatry: PAtient seen and chart reviewed. Patient was awake and more appropriately interactive. Still disoriented to place but oriented to time and not agitated or fighting. Patient has insight about his ammonia leevel, which has come down quite a bit since yesterday. sign of acute suicidality or intent to be dangerous. am going to decrease both the standing geodon and valium in half doses for a day and then stop them Leave prn meds in place. Hopefully he wil continue to clear enough to tolerate tapering the standing doses over the next day. Continue po methadone as he confirms he was getting methadone as an outpt.  Electronic Signatures: Madellyn Denio, Madie Reno (MD)  (Signed on 09-Dec-15 16:08)  Authored  Last Updated: 09-Dec-15 16:08 by Gonzella Lex (MD)

## 2014-07-02 NOTE — Consult Note (Signed)
PATIENT NAME:  [name], [name] MR#:  [id number] DATE OF BIRTH:  03-Oct-1955  DATE OF CONSULTATION:  02/15/2014  CONSULTING PHYSICIAN:  Gonzella Lex, MD  IDENTIFYING INFORMATION AND REASON FOR CONSULTATION: This is a 59 year old man with a history of diabetes, who was brought to the hospital unresponsive with blood in his airway. Consultation for confusion and delirium.   HISTORY OF PRESENT ILLNESS: Information obtained from the chart. The patient is able to speak a little bit, but is not able to give coherent history. This patient came into the hospital with blood in his airway and was found to have infiltrates in his lung. Has a history of diabetes, appears to have cirrhosis, which is advanced as well. Family gave a history of abuse of cocaine and reported that he had been treated with methadone. Not clear to me at this point if he was in a stable methadone clinic or not. The patient became agitated after being extubated. Nursing tells me that this morning he was quite agitated and confused, lashing out, trying to hit nursing staff. He was given 20 mg of IM Geodon and has been given IV Valium since then and he is currently at his best behavior they have seen.   PAST PSYCHIATRIC HISTORY: No previous history available in the chart. The patient is not able to give me any history right now. Appears to have a history of substance abuse. The full details are unclear.   PAST MEDICAL HISTORY: Diabetes and cirrhosis, currently acute respiratory failure.   FAMILY HISTORY: Unknown.   SOCIAL HISTORY: Unknown, although evidently family are involved and brought him into the hospital.   LABORATORY RESULTS: On admission, his drug screen was positive for benzodiazepines, cocaine, and methadone. Liver enzymes are elevated. Albumin is very low. Ammonia was reported today as being 102. Continues to have multiple chemistry abnormalities.   CURRENT MEDICATIONS: Diazepam 4 mg IV q. 8 hours, currently standing,  docusate liquid 100 mg q. 12 hours, lactulose now being given as an enema, methadone 20 mg q. 8, but it appears that is not being given because he is not currently able to swallow. IV piperacillin and vancomycin, Geodon 20 mg IM q. 12 standing, insulin p.r.n.   ALLERGIES: No known drug allergies.   REVIEW OF SYSTEMS: The patient does not offer any specific complaints.   MENTAL STATUS EXAMINATION:  The patient was awake and responded to his name. Made eye contact and was able to follow me with his eyes. He was able to respond and answer questions in an appropriate manner, although he was incorrect, stating that he was currently in [place] and it was unclear if he really understood that he was in a hospital. He was able to state his name. Speech was slurred and decreased in amount. Affect flat. The patient appears to be very sick. Minimal psychomotor activity. The rest of the cognition and presence or absence of hallucinations, cannot be acutely determined.   ASSESSMENT: A 59 year old man currently with delirium related to multiple medical problems including his post intubation state, hospitalization, his cirrhosis with very high ammonia. Some agitation could also be related to withdrawal from opiates and possibly benzodiazepines.  At this point, he appears to be calm and manageable with the medications that were ordered.   TREATMENT PLAN: I agree completely with the current medicines. Continue the standing IM Geodon 20 mg twice a day, and IV Valium 4 mg 3 times a day. Hopefully, if he is continuing to physically recover, the  Valium can be backed off, discontinued, or changed to p.r.n. tomorrow, and after a day or so probably cut the Geodon in half and then discontinue it. We will follow up tomorrow.   DIAGNOSIS, PRINCIPAL AND PRIMARY:  AXIS I: Delirium, multifactorial.   SECONDARY DIAGNOSES: AXIS I: Cocaine abuse, opiate abuse, benzodiazepine abuse.    ____________________________ Gonzella Lex,  MD jtc:LT D: 02/15/2014 17:51:38 ET T: 02/15/2014 18:58:08 ET JOB#: [id number]  cc: Gonzella Lex, MD, <Dictator> Gonzella Lex MD ELECTRONICALLY SIGNED 02/20/2014 11:20

## 2014-07-02 NOTE — H&P (Signed)
PATIENT NAME:  [name], [name] MR#:  [id number] DATE OF BIRTH:  30-Oct-1955  DATE OF ADMISSION:  109-08-202015  PRIMARY CARE PHYSICIAN:  Unknown.    CHIEF COMPLAINT: Brought in with unresponsiveness and blood via the upper airway.   HISTORY OF PRESENT ILLNESS: This is a 59 year old man brought in after telling his mother to call 911, he passed out and blood came from the upper airway. In the ER he was hypoxic and hypercarbic and acidotic and intubated for respiratory distress. I am unable to get any history from the patient. No family present at this time and unavailable to reach at this time. History obtained from the ER physician. Apparently he was at his PMD yesterday, told that his diabetes was good. He had been using crack cocaine, urine toxicology positive for cocaine, benzodiazepines, and methadone. The patient intubated in the ER. CT scan of the head negative. CT scan of the chest positive for advanced COPD, infiltrates right middle lobe, right lower lobe, multifocal in nature, likely aspiration, soft tissue prominence in the posterior mediastinum and the mid esophagus, coronary artery disease and cirrhosis of the liver on CT scan. Hospitalist services were contacted for further evaluation.   PAST MEDICAL HISTORY: Possible diabetes, possible cirrhosis, polysubstance abuse.   PAST SURGICAL HISTORY: Unknown.   ALLERGIES: Listed in the computer as no known drug allergies.   MEDICATIONS: Unknown.   SOCIAL HISTORY: Unknown.   FAMILY HISTORY: Unknown.   REVIEW OF SYSTEMS: Unable to obtain secondary to being intubated and sedated.   PHYSICAL EXAMINATION:  VITAL SIGNS: On presentation included a temperature of 97.4, pulse 116, respirations 22, initial blood pressure 177/101, pulse oximetry 91% on nonrebreather. Most recent vital signs included a blood pressure of 81/47, pulse oximetry 96% on oxygen, respirations 17.  GENERAL: No respiratory distress now on the ventilator.  EYES: Conjunctivae  and lids normal. Pupils equal, round, and reactive to light. Unable to test extraocular muscles.  EARS, NOSE, MOUTH, AND THROAT: Tympanic membranes, no erythema. Nasal mucosa, blood from the nasal mucosa, enlarged turbinates, no active bleed seen, but I cannot visualize further down.  Mouth, dried blood around the mouth, ET tube in place.  NECK: No JVD. No bruits. No lymphadenopathy.  RESPIRATORY: Rhonchi throughout entire lung field worse on the right than the left.  CARDIOVASCULAR: S1 and S2, tachycardic. No gallops, rubs, or murmurs heard. Carotid upstroke 2 + bilaterally. No bruits.  Dorsalis pedis pulses 1 + bilaterally.  GASTROINTESTINAL: Abdomen soft, nontender. No organomegaly/splenomegaly. Normoactive bowel sounds.  LYMPHATIC: No lymph nodes in the neck.  MUSCULOSKELETAL: No cyanosis on the ventilator. No edema.  SKIN: No ulcers or lesions seen anteriorly.  NEUROLOGIC: Cranial nerves unable to test secondary to being on the ventilator.  PSYCHIATRIC: Unable to test secondary to being on the ventilator.   LABORATORY AND RADIOLOGICAL DATA: CT scan of the chest showed cirrhosis of the liver, evidence of coronary artery disease, soft tissue prominence posterior mediastinum, correlate with EGD, advanced COPD and emphysema, infiltrates acute on the right middle lobe right lower lobe, and lingula, and possibly left lower lobe. CT scan of the head negative for acute event. Chest x-ray shows appropriate positional of the endotracheal tube. ABG showed a pH of 7.13, pCO2 of 73, pO2 of 92, that is on 80%, bicarbonate 24.3, that is on assist control on the ventilator, this ABG was drawn 10 minutes after being intubated. White blood cell count 12.2, H and H 13.9 and 44.0, platelet count of 140,000, MCV of  104. Glucose 135, BUN 14, creatinine 1.05, sodium 139, potassium 4.5, chloride 106, CO2 of 23, calcium 9.0. Liver function tests, alkaline phosphatase 214, ALT 183, AST 175, albumin low at 3.1. Urinalysis  negative. Urine toxicology positive for cocaine, benzodiazepine, and methadone. INR of 1.2, PT 14.8. Ethanol less than 3. Acetaminophen less than 2.  Salicylates 2.9. EKG, sinus tachycardia at 132 beats per minute, left atrial enlargement, nonspecific ST-T wave changes.   ASSESSMENT AND PLAN:  1.  Acute hypoxic hypercarbic respiratory failure, likely from drug overdose and aspiration pneumonia. Continue ventilation support at this time to blow off CO2. Will repeat an ABG at 1:00 p.m. and adjust ventilator from there.  We will get a pulmonary consultation.  2.  Clinical sepsis with aspiration pneumonia right middle lobe, right lower lobe, and possibly left lower lobe. We will give vancomycin, Zosyn, and Levaquin and continue to monitor. 3.  Acute bleeding. This could be severe epistaxis from drug abuse, could also be a gastrointestinal bleed versus from the lung. When the ER physician intubated the patient no blood came up through the vocal cords, good visualization of the vocal cords without blood coming through, the blood was seen in the mouth and upper airway. I will get ENT consultation and GI consultation. Continue to monitor serial hemoglobins. Will give empiric Protonix since I cannot rule out GI bleed at this point.  4.  Cirrhosis seen on CT scan and increased liver function tests. I will send off hepatitis profiles in the a.m. Once able to consent would benefit from HIV testing. Continue to monitor liver function tests.  5.  Polysubstance abuse. We will put on CIWA protocol just in case alcohol also. Fentanyl will be given p.r.n. for sedation to prevent withdrawal from opiates, to prevent withdrawal from benzodiazepines CIWA protocol ordered also.    TIME SPENT ON ADMISSION: 50 minutes.   The patient is critically ill and high risk for cardiopulmonary arrest.     ____________________________ Tana Conch. Leslye Peer, MD rjw:bu D: 12020-08-813 12:42:57 ET T: 12020-08-813 13:37:55  ET JOB#: [id number]  cc: Tana Conch. Leslye Peer, MD, <Dictator> Marisue Brooklyn MD ELECTRONICALLY SIGNED 02/09/2014 14:26

## 2014-07-02 NOTE — Consult Note (Signed)
Psychiatry: Follow-up for this patient with resolving delirium.  On interview today the patient has no complaints.  Says he is feeling better.  Does not offer any specific concerns. review of systems he denies suicidality denies psychotic symptoms denies pain. mental status he is easily arousable.  Makes good eye contact.  Psychomotor activity still sluggish.  Speech is still a little bit slurred and decreased in amount.  Affect still blunted and occasionally confused.  He is oriented to where he is and the month and year.  Reports that his mood is feeling better denies any suicidality. this point is need for when necessary medication is minimal in terms of psychiatric medicine.  He is still on a modest dose of Seroquel.  Still on his methadone.  Otherwise doing well. recommendations for changes to medication at this point.  I will continue to follow up as needed.  Electronic Signatures: Clapacs, Madie Reno (MD)  (Signed on 21-Dec-15 20:32)  Authored  Last Updated: 21-Dec-15 20:32 by Gonzella Lex (MD)

## 2014-07-02 NOTE — Consult Note (Signed)
Psychiatry: Follow-up for this 59 year old man with cirrhosis and delirium.  Nursing tells me that he had to be put on a Precedex drip today because of agitation this morning.  The rest of the day he has been calm.  On interview the patient is awake and makes eye contact.  He thinks that he is at a different facility and thinks he is in a dialysis center.  Knows the correct year.  Speech is only semi-coherent.  Answers some questions appropriately but at other times seems to ramble and other times makes no sense at all.  Patient was calm and not agitated.  Not lashing out.  Denies having any pain or any specific physical complaints right now. is currently at 2 mg IV every 8.  It looks like the last Geodon IM was not given I'm not sure if he had actively refused it. I'm sure as to try and eventually get him off the Precedex drip as well.  I'm going to order 2 mg of Haldol every 6 hours IV standing and see if that will help to wean off other anti-delirium medicines.  Continue to follow.  Electronic Signatures: Jatavius Ellenwood, Madie Reno (MD)  (Signed on 10-Dec-15 19:56)  Authored  Last Updated: 10-Dec-15 19:56 by Gonzella Lex (MD)

## 2014-07-02 NOTE — Consult Note (Signed)
Psychiatry: Follow-up for this patient with ongoing delirium.  Case discussed with Dr. Clayton Bibles this morning.  She notes that this morning the patient was very sedated whereas he was still agitated last evening.  I decided to rearrange his medicine to emphasize his getting Seroquel for delirium and sedation in the evening while decreasing standing morning doses of Haldol and other IV medicines and leaving when necessary medicines in place. evening I found the patient to be delirious and sedated.  Responded only a little bit verbally.  Nursing however reports that he is still intermittently agitated and has required when necessary medicines to keep him from trying to get out of bed. how much recovery potential he has.  Coming down on some of the daytime medicines may give a better picture of where he is at mentally.  Will not change any of the current doses and will follow-up tomorrow. delirium due to medical problems.  Electronic Signatures: Itzel Lowrimore, Madie Reno (MD)  (Signed on 17-Dec-15 18:32)  Authored  Last Updated: 17-Dec-15 18:32 by Gonzella Lex (MD)

## 2014-07-02 NOTE — Consult Note (Signed)
Psychiatry: Follow-up for this patient with delirium in the emergency room.  Currently he is only on a Precedex drip as far as sedating drip medicines.  Has been receiving Geodon 10 mg twice a day.  Information obtained from the current attending physician and from nursing staff.  I am told that he routinely becomes agitated about 6:00 or 6:30 every evening.  Patient himself unable to give any history.  No review of systems. status exam: Patient is asleep.  I did not try to wake him up as I did not want to call any extra agitation.  Affect flat.  Staff reports that even when awake he is rarely able to interact in a meaningful way. is to try and get him off of the Precedex drip to allow for progress out of the critical care unit.  Underlying functional state unclear.  Medicine is considering talking to palliative care.  In the meantime I will increase his Geodon back to 20 mg IM twice a day.  The evening dose will be given right around 6:00 which should help with the extra agitation.  We could also try adding an extra 2 mg of Haldol at 6:00 to that particular dose to see if that helps to smooth things out.  I will continue to follow.  Electronic Signatures: Leen Tworek, Madie Reno (MD)  (Signed on 14-Dec-15 13:05)  Authored  Last Updated: 14-Dec-15 13:05 by Gonzella Lex (MD)

## 2014-07-02 NOTE — Consult Note (Signed)
Psychiatry: Follow-up for this patient with persistent delirium.  On interview today the patient was sleeping but arousable.  He was able to use his hands to hold a cup and drink out of a straw.  He was having visual hallucinations and seeing people in the room.  He didn't become combative or agitated.  Was able to answer some questions but then became distracted and delirious again.  Patient's drips appear to have been tapered down to Precedex only at this point.  He is not getting standing Geodon shots.  He is still getting the standing Haldol shots. going to restart the standing Geodon but at 10 mg IM every 12 hours.  Continue the Haldol.  Continue keeping his ammonia down.  We will see if we can then gradually try and taper him off of the Precedex.  This is good news that we've been able to get him off of all of the opiates except the methadone.  Will follow.  Electronic Signatures: Gonzella Lex (MD)  (Signed on 11-Dec-15 19:19)  Authored  Last Updated: 11-Dec-15 19:19 by Gonzella Lex (MD)

## 2014-07-02 NOTE — Consult Note (Signed)
Psychiatry: Follow-up for this 59 year old man with persistent delirium.  As of this morning they have finally been able to turn off the Precedex as well.  On interview today the patient was awake but drowsy.  Able to state his name.  Didn't have any new complaints.  Denied any specific pain or physical discomfort.  Still confused and thought he was in [place].  Not able to cooperate with further cognitive testing. current levels of antipsychotics to see if he can maintain his calm demeanor off of the drips.  If so we can start gradually backing off again on some of the antipsychotics.  No other change the plan.  Electronic Signatures: [place], Madie Reno (MD)  (Signed on 15-Dec-15 17:58)  Authored  Last Updated: 15-Dec-15 17:58 by Gonzella Lex (MD)

## 2014-07-06 NOTE — Discharge Summary (Signed)
PATIENT NAME:  [name], MELICHAR MR#:  [id number] DATE OF BIRTH:  04/24/1955  DATE OF ADMISSION:  [id number] DATE OF DISCHARGE:  03/03/2014  ADDENDUM:   This is an addendum to the discharge summary done on the 03/02/2014 by Dr. Abel Presto. After a long hospital course, the patient is accepted to a rehabilitation center at [facility] and is being discharged today.   DISCHARGE DIAGNOSES: 1.  Acute hypoxic and hypercapnic respiratory failure secondary to aspiration pneumonia.  2.  Benzodiazepine and cocaine abuse, undergoing withdrawal.  3.  History of liver cirrhosis and hepatitis C. 4.  Malnutrition.    For further details, please see interim discharge summary on 03/02/2014.    ____________________________ Ceasar Lund. Anselm Jungling, MD vgv:at D: 03/03/2014 09:19:36 ET T: 03/03/2014 09:53:17 ET JOB#: [id number]  cc: Ceasar Lund. Anselm Jungling, MD, <Dictator> Vaughan Basta MD ELECTRONICALLY SIGNED 03/16/2014 0:41

## 2014-07-06 NOTE — H&P (Signed)
PATIENT NAME:  ALMOND, FITZGIBBON MR#:  [id number] DATE OF BIRTH:  [birth year]  DATE OF ADMISSION:  [id number]  ADDENDUM:  No dictation  ____________________________ Ceasar Lund. Anselm Jungling, MD vgv:at D: 03/03/2014 09:19:46 ET T: 03/03/2014 09:41:54 ET JOB#: [id number]  cc: Ceasar Lund. Anselm Jungling, MD, <Dictator> Vaughan Basta MD ELECTRONICALLY SIGNED 03/16/2014 0:40

## 2014-07-10 NOTE — Discharge Summary (Signed)
PATIENT NAME:  [name], [name] MR#:  [id number] DATE OF BIRTH:  1955-11-17  DATE OF ADMISSION:  06/13/2014 DATE OF DISCHARGE:  06/15/2014  PATIENT'S PRIMARY CARE PHYSICIAN:  [facility].  FINAL DIAGNOSES:  1.  Acute respiratory failure with hypoxia, resolved.  2.  Pneumonia left lower lobe.  3.  Hyperkalemia.  4.  Anxiety.  5.  Chronic pain.  6.  Type 2 diabetes without complications.   MEDICATIONS ON DISCHARGE INCLUDE:  Metformin 500 mg twice a day, DuoNeb nebulizer solution 3 mL 4 times a day as needed for shortness of breath, methadone 10 mg 1 to 2 tablets 3 times a day, Symbicort 160/4.5 two puffs twice a day, multivitamin 1 tablet daily, Spiriva 1 inhalation daily, Xanax 1 mg as needed for anxiety, metoprolol tartrate 100 mg twice a day, prednisone taper 5 mg 4 tablets day 1, 3 tablets day 2, 2 tablets day 3, 1 tablet day 1 and 5 then stop.  Cefuroxime 500 mg 1 tablet every 12 hours for 7 days, Zithromax 250 mg 1 tablet daily finish up the course.   DIET: Low sodium, carbohydrate-controlled, regular consistency.   FOLLOWUP:  With your pulmonologist 1 to 2 weeks with [facility].   HOSPITAL COURSE: The patient was admitted 06/13/2014 and discharged 06/15/2014. Came in not feeling well with cough and also was in respiratory distress requiring BiPAP in the Emergency Room. The patient was found to have a left lower lobe pneumonia, started on antibiotics, and oxygen supplementation.   LABORATORY AND RADIOLOGICAL DATA DURING THE HOSPITAL COURSE:  Blood cultures negative.  EKG: Normal sinus rhythm, biatrial enlargement, left ventricular hypertrophy. Chest x-ray showed increased interstitial prominence of the left lower airspace disease concerning for bronchopneumonia. ABG showed a pH of 7.34, pCO2 of 49, pO2 119.  That was on 50% oxygen.  Troponin borderline at 0.08. White blood cell count 18.2, hemoglobin and hematocrit 13.4 and 41.7, platelet  count of 122,000.  Glucose 133, BUN 18, creatinine 0.79, sodium 142, potassium 5.4, chloride 109, CO2 28, calcium 8.9. Liver function tests:  ALT elevated at 191, AST elevated at 166, total protein 7.1, albumin 3.4. Blood culture negative.  Next troponin down at 0.04. Sputum culture normal flora. Echocardiogram showed an ejection fraction of 55 to 60%, left ventricular hypertrophy, mild mitral regurgitation. White count upon discharge 13.5, hemoglobin 13.0, creatinine 0.8, potassium 3.3.  HOSPITAL COURSE PER PROBLEM LIST: 1.  For the patient's acute respiratory failure with hypoxia, this had resolved. Initially requiring BiPAP in the ER.  Was on 5 liters of oxygen, discontinued upon discharge. The patient will go home on room air.  2.  Pneumonia left lower lobe. The patient was started on Rocephin and Zithromax and switched over to Ceftin and Zithromax upon discharge.  Lungs clear upon discharge.  3.  Hyperkalemia. This was treated with Kayexalate, potassium removed.  4.  Anxiety. The patient takes Xanax at home.  5.  Chronic pain on methadone.  6.  Type 2 diabetes without complication on metformin.  7.  Hypertension, essential.  Blood pressure slightly elevated on discharge, but patient was very anxious to go home. Continue metoprolol.  8.  Elevated troponin likely with acute respiratory failure, demand ischemia. This was not a myocardial infarction.  TIME SPENT ON DISCHARGE: 35 minutes.   ____________________________ Tana Conch. Leslye Peer, MD rjw:sp D: 06/15/2014 14:08:15 ET T: 06/15/2014 17:49:31 ET JOB#: [id number]  cc: Tana Conch. Leslye Peer, MD, <Dictator> [place]  MD ELECTRONICALLY SIGNED 06/16/2014 15:43

## 2014-07-10 NOTE — H&P (Signed)
PATIENT NAME:  [name], [name] MR#:  [id number] DATE OF BIRTH:  10-17-1955  DATE OF ADMISSION:  06/13/2014  REFERRING PHYSICIAN: Harvest Dark, MD  PRIMARY CARE PHYSICIAN: Nonlocal.  ADMITTING PHYSICIAN: Azucena Freed, MD  CHIEF COMPLAINT: 1.  Not feeling well for the past 1 week.  2.  Cough for the past 1 week.  3.  Acute respiratory distress.  HISTORY OF PRESENT ILLNESS: A 59 year old Caucasian male with a past medical history of COPD, diabetes mellitus type 2, hypertension, chronic hepatitis C/cirrhosis, history of polysubstance abuse, and chronic pain syndrome who was brought in with the complaints of acute respiratory distress, found to be in acute respiratory distress with hypoxia on arrival and placed on BiPAP by the ED physician. The patient stated that he has not been feeling well for the past 1 week with increasing cough with expectoration and today he felt generalized weakness and having shortness of breath. Hence, he called EMS and was brought to the Emergency Room for further evaluation. The patient was evaluated by the ED physician and was placed on BiPAP for acute respiratory failure with hypoxia following which his oxygenation improved. Currently he is maintained on oxygen supplementation through nasal cannula and his saturations are well above 95%. No history of any fever or chills. No history of chest pain, shortness of breath. No loss of consciousness. No palpitations. No nausea, vomiting, diarrhea or constipation. Denies any dysuria, frequency, or urgency. In the Emergency Room, the patient was evaluated by the ED physician and blood work was significant for elevated white blood cell count of 18.2 and chest x-ray showed left lower lobe opacity consistent with pneumonia. After obtaining blood cultures, the patient was started on IV antibiotics and hospitalist service was consulted for further evaluation and management. The patient is currently receiving oxygen supplementation  and states his respiratory status is better, and he denies any complaints at this time.   PAST MEDICAL HISTORY: 1.  COPD.  2.  Diabetes mellitus type 2.  3.  Hypertension.  4.  Chronic hepatitis C/cirrhosis.  5.  History of polysubstance abuse in the past.  6.  Chronic pain syndrome.   PAST SURGICAL HISTORY: No history of any surgeries in the past.   ALLERGIES: No known drug allergies.   SOCIAL HISTORY: He is single and lives with his mom at home. He is an active smoker, about 1 pack per day. Denies any alcohol or substance abuse currently. History of polysubstance abuse in the past.   FAMILY HISTORY: No history of any hypertension, diabetes, heart problems or CVA.  HOME MEDICATIONS:  1.  Albuterol ipratropium inhalation solution 4 times a day as needed for shortness of breath.  2.  Alprazolam 1 mg tablet 1 tablet orally once a day as needed for anxiety.  3.  Metformin 500 mg 1 tablet 2 times a day.  4.  Methadone 10 mg 1 to 2 tablets 3 times a day.  5.  Metoprolol tartrate 100 mg 1 tablet orally 2 times a day.  6.  Multivitamin 1 tablet orally once a day.  7.  Spiriva 18 mcg inhalation capsule 1 capsule inhalation once a day.  8.  Symbicort 160 mcg 4.5 mcg inhalation 2 puff inhalation 2 times a day.  REVIEW OF SYSTEMS: CONSTITUTIONAL: Negative for fever or chills. Positive for fatigue and generalized weakness and not feeling well for the past 1 week.  EYES: Negative for blurred vision, double vision. No pain. No redness. No discharge.  EARS, NOSE, AND THROAT:  Negative for tinnitus, ear pain, hearing loss, epistaxis, or nasal discharge.  RESPIRATORY: Positive for cough with increased expectoration for the past 1 week. No wheezing. He does have some shortness of breath with hypoxia on arrival. No hemoptysis. No painful respirations.  CARDIOVASCULAR: Negative for chest pain, palpitations, syncopal episodes, orthopnea, dyspnea on exertion, or pedal edema.  GASTROINTESTINAL: Negative  for nausea, vomiting, diarrhea, abdominal pain, hematemesis, melena, rectal bleeding, or GERD symptoms.  GENITOURINARY: Negative for dysuria, frequency, urgency, hematuria. ENDOCRINE: Negative for polyuria, nocturia, heat or cold intolerance.  HEME AND LYMPH: Negative for anemia, easy bruising, bleeding, or swollen glands.  INTEGUMENTARY: Negative for acne, skin rash or lesion. MUSCULOSKELETAL: History of chronic pain syndrome, on chronic pain medications and stable. Denies any pain at this time.  NEUROLOGICAL: Negative for focal weakness, numbness. No history of CVA, TIA or seizure disorder.  PSYCHIATRIC: Denies any depression.   PHYSICAL EXAMINATION: VITAL SIGNS: Temperature 97 degrees Fahrenheit, pulse rate 92 per minute, respirations 32 per minute on arrival, current respirations around 24 to 28 per minute, blood pressure on arrival 164/84, current blood pressure 123/65, currently maintaining around 99% on oxygen supplementation.  GENERAL: Medium built male, alert and oriented, not in acute distress, comfortably resting in bed.  HEAD: Atraumatic, normocephalic.  EYES: Pupils are equal and react to light and accommodation. No conjunctival pallor. No icterus. Extraocular movements are intact. NOSE: No drainage. No lesions.  EARS: No drainage. No external lesions. ORAL CAVITY: No mucosal lesions. No exudates.  NECK: Supple. No JVD. No thyromegaly. No carotid bruit. Range of motion of neck within normal limits.  RESPIRATORY: Good respiratory effort. Not using accessory muscles of respiration. Bilateral air entry present. Bilateral few rhonchi present. Rales at the left mid zone and the left lower zone present.  CARDIOVASCULAR: S1, S2 regular. No murmurs, gallops, or clicks. Pulses equal at carotid, femoral, and pedal pulses. No peripheral edema.  GASTROINTESTINAL: Abdomen is soft and nontender. No hepatosplenomegaly. No masses noted. There is no guarding. Bowel sounds present and equal in all 4  quadrants.  GENITOURINARY: Deferred.  MUSCULOSKELETAL: No joint tenderness or effusion. Range of motion adequate.  SKIN: Inspection within normal limits.  LYMPHATIC: No cervical lymphadenopathy.  VASCULAR: Good dorsalis pedis and posterior tibial pulses.  NEUROLOGICAL: Alert, awake, and oriented x3. Cranial nerves II through XII grossly intact. No sensory deficit. Motor strength is 5/5 in both upper and lower extremities. DTRs 2+ bilateral and symmetrical. Plantars downgoing.  PSYCHIATRIC: Alert, awake, and oriented x3. Judgment and insight adequate. Memory and mood within normal limits.   DIAGNOSTIC DATA: Serum glucose 133, BUN 18 creatinine 0.79, sodium 142, potassium 5.4, chloride 109, bicarb 28, total calcium 8.9, total protein 7.1, albumin 3.4, total bili 1.2, alk phos 124, AST 166, ALT 191. Troponin 0.08. WBC 18.2, hemoglobin 13.4, hematocrit 41.7, platelet count 122,000.   ABG: PH 7.34, pCO2 49, PO2 119, FiO2 15%, bicarb 26.4, O2 saturation 100%.   Chest x-ray: Increased interstitial prominence with left lower lobe airspace opacity concerning for bronchopneumonia.   EKG: Normal sinus rhythm with ventricular rate of 93 beats per minute, left ventricular hypertrophy, left axis deviation. No acute ST-T changes.   ASSESSMENT AND PLAN: A 59 year old Caucasian male with history of chronic obstructive pulmonary disease, hepatitis C/cirrhosis, diabetes mellitus type 2, hypertension, history of polysubstance abuse, chronic pain syndrome on methadone, presents with 1 week history of cough with expectoration and not feeling well and brought in with acute respiratory distress, found to be hypoxic on arrival to  the Emergency Room, placed on BiPAP by the ED physician and further work-up revealed elevated white blood cell count with chest x-ray with left lower lobe pneumonia.  1.  Left lower lobe pneumonia, which is community-acquired. Plan: Admit. Blood and sputum cultures obtained. Continue oxygen  supplementation and follow oxygen saturations. IV antibiotics namely ceftriaxone, azithromycin and Levaquin, DuoNebs, and follow up CBC and cultures.  2.  Acute hypoxic respiratory failure secondary to left lower lobe pneumonia. Currently on oxygen supplementation through nasal cannula. Plan: Continue above treatment and follow up oxygen saturations.  3.  Mildly elevated troponin likely secondary due to demand ischemia. No chest pain. EKG with no ischemic changes. Plan: Telemetry monitoring, continue aspirin and beta blocker, cycle cardiac enzymes, obtain echocardiogram, and further work-up accordingly.  4.  Hyperkalemia, mild, cause not known. Plan: Kayexalate 30 grams one dose and follow BMP.  5.  Chronic obstructive pulmonary disease, stable. Continue home medications namely Spiriva, Symbicort, DuoNebs and oxygen supplementation. Follow up oxygen saturations.  6.  Diabetes mellitus, type 2. Stable on metformin. Continue home medications along with sliding scale insulin.  7.  Hypertension. Stable on home medications. Continue same. Follow blood pressure measurements.  8.  History of chronic hepatitis C/cirrhosis. No acute symptoms. LFTs stable. Monitor.  9.  Chronic pain syndrome. On methadone, stable. Continue same.  10.  History of polysubstance abuse in the past. No acute problems and monitor.  11.  Tobacco usage, continuous. Counseled to quit. The patient not motivated to quit.  12.  Deep vein thrombosis prophylaxis. Subcutaneous Lovenox.  13.  Gastrointestinal prophylaxis. Proton pump inhibitor.   CODE STATUS: FULL code.   TIME SPENT: 50 minutes.  ____________________________ Juluis Mire, MD enr:sb D: 06/13/2014 06:48:33 ET T: 06/13/2014 07:32:07 ET JOB#: [id number]  cc: Juluis Mire, MD, <Dictator> Juluis Mire MD ELECTRONICALLY SIGNED 06/13/2014 18:46

## 2014-08-10 ENCOUNTER — Emergency Department: Payer: Medicaid Other

## 2014-08-10 ENCOUNTER — Inpatient Hospital Stay: Payer: Medicaid Other

## 2014-08-10 ENCOUNTER — Inpatient Hospital Stay
Admission: EM | Admit: 2014-08-10 | Discharge: 2014-08-12 | DRG: 442 | Disposition: A | Discharge status: Home / Self Care | Payer: Medicaid Other | Attending: Internal Medicine | Admitting: Internal Medicine

## 2014-08-10 ENCOUNTER — Encounter: Payer: Self-pay | Admitting: Emergency Medicine

## 2014-08-10 DIAGNOSIS — F419 Anxiety disorder, unspecified: Secondary | ICD-10-CM | POA: Diagnosis present

## 2014-08-10 DIAGNOSIS — F1910 Other psychoactive substance abuse, uncomplicated: Secondary | ICD-10-CM

## 2014-08-10 DIAGNOSIS — K7460 Unspecified cirrhosis of liver: Secondary | ICD-10-CM | POA: Diagnosis present

## 2014-08-10 DIAGNOSIS — J449 Chronic obstructive pulmonary disease, unspecified: Secondary | ICD-10-CM | POA: Diagnosis present

## 2014-08-10 DIAGNOSIS — E119 Type 2 diabetes mellitus without complications: Secondary | ICD-10-CM | POA: Diagnosis present

## 2014-08-10 DIAGNOSIS — B182 Chronic viral hepatitis C: Secondary | ICD-10-CM | POA: Diagnosis present

## 2014-08-10 DIAGNOSIS — M549 Dorsalgia, unspecified: Secondary | ICD-10-CM | POA: Diagnosis present

## 2014-08-10 DIAGNOSIS — R195 Other fecal abnormalities: Secondary | ICD-10-CM | POA: Diagnosis present

## 2014-08-10 DIAGNOSIS — F17210 Nicotine dependence, cigarettes, uncomplicated: Secondary | ICD-10-CM | POA: Diagnosis present

## 2014-08-10 DIAGNOSIS — G8929 Other chronic pain: Secondary | ICD-10-CM | POA: Diagnosis present

## 2014-08-10 DIAGNOSIS — F1020 Alcohol dependence, uncomplicated: Secondary | ICD-10-CM | POA: Diagnosis present

## 2014-08-10 DIAGNOSIS — I10 Essential (primary) hypertension: Secondary | ICD-10-CM | POA: Diagnosis present

## 2014-08-10 DIAGNOSIS — B159 Hepatitis A without hepatic coma: Secondary | ICD-10-CM | POA: Diagnosis present

## 2014-08-10 DIAGNOSIS — K7290 Hepatic failure, unspecified without coma: Principal | ICD-10-CM | POA: Diagnosis present

## 2014-08-10 DIAGNOSIS — K7682 Hepatic encephalopathy: Secondary | ICD-10-CM

## 2014-08-10 DIAGNOSIS — Z79899 Other long term (current) drug therapy: Secondary | ICD-10-CM | POA: Diagnosis not present

## 2014-08-10 DIAGNOSIS — R188 Other ascites: Secondary | ICD-10-CM | POA: Diagnosis present

## 2014-08-10 DIAGNOSIS — F1120 Opioid dependence, uncomplicated: Secondary | ICD-10-CM | POA: Diagnosis present

## 2014-08-10 DIAGNOSIS — K7291 Hepatic failure, unspecified with coma: Secondary | ICD-10-CM | POA: Diagnosis not present

## 2014-08-10 DIAGNOSIS — B1920 Unspecified viral hepatitis C without hepatic coma: Secondary | ICD-10-CM

## 2014-08-10 HISTORY — DX: Anxiety disorder, unspecified: F41.9

## 2014-08-10 HISTORY — DX: Dorsalgia, unspecified: M54.9

## 2014-08-10 HISTORY — DX: Other chronic pain: G89.29

## 2014-08-10 HISTORY — DX: Chronic obstructive pulmonary disease, unspecified: J44.9

## 2014-08-10 HISTORY — DX: Type 2 diabetes mellitus without complications: E11.9

## 2014-08-10 LAB — CBC
HEMATOCRIT: 45.2 % (ref 40.0–52.0)
Hemoglobin: 15.2 g/dL (ref 13.0–18.0)
MCH: 33 pg (ref 26.0–34.0)
MCHC: 33.6 g/dL (ref 32.0–36.0)
MCV: 98.4 fL (ref 80.0–100.0)
Platelets: 104 10*3/uL — ABNORMAL LOW (ref 150–440)
RBC: 4.59 MIL/uL (ref 4.40–5.90)
RDW: 13.1 % (ref 11.5–14.5)
WBC: 8.4 10*3/uL (ref 3.8–10.6)

## 2014-08-10 LAB — COMPREHENSIVE METABOLIC PANEL
ALBUMIN: 3.6 g/dL (ref 3.5–5.0)
ALT: 400 U/L — AB (ref 17–63)
AST: 376 U/L — AB (ref 15–41)
Alkaline Phosphatase: 148 U/L — ABNORMAL HIGH (ref 38–126)
Anion gap: 14 (ref 5–15)
BILIRUBIN TOTAL: 1.1 mg/dL (ref 0.3–1.2)
BUN: 16 mg/dL (ref 6–20)
CHLORIDE: 106 mmol/L (ref 101–111)
CO2: 23 mmol/L (ref 22–32)
Calcium: 10 mg/dL (ref 8.9–10.3)
Creatinine, Ser: 0.92 mg/dL (ref 0.61–1.24)
GFR calc Af Amer: >60 mL/min (ref >60)
Glucose, Bld: 107 mg/dL — ABNORMAL HIGH (ref 65–99)
POTASSIUM: 4.3 mmol/L (ref 3.5–5.1)
SODIUM: 143 mmol/L (ref 135–145)
TOTAL PROTEIN: 7.8 g/dL (ref 6.5–8.1)

## 2014-08-10 LAB — GLUCOSE, CAPILLARY
GLUCOSE-CAPILLARY: 105 mg/dL — AB (ref 65–99)
GLUCOSE-CAPILLARY: 104 mg/dL — AB (ref 65–99)

## 2014-08-10 LAB — AMMONIA: Ammonia: 109 umol/L — ABNORMAL HIGH (ref 9–35)

## 2014-08-10 MED ORDER — METOPROLOL TARTRATE 100 MG PO TABS
100.0000 mg | ORAL_TABLET | Freq: Two times a day (BID) | ORAL | Status: DC
  Administered 2014-08-10 – 2014-08-12 (×4): 100 mg via ORAL
  Filled 2014-08-10 (×4): qty 1

## 2014-08-10 MED ORDER — LACTULOSE 10 GM/15ML PO SOLN
30.0000 g | Freq: Once | ORAL | Status: AC
  Administered 2014-08-10: 30 g via ORAL

## 2014-08-10 MED ORDER — NITROGLYCERIN 2 % TD OINT
1.0000 [in_us] | TOPICAL_OINTMENT | Freq: Four times a day (QID) | TRANSDERMAL | Status: DC
  Administered 2014-08-10 – 2014-08-11 (×3): 1 [in_us] via TOPICAL
  Filled 2014-08-10 (×4): qty 1

## 2014-08-10 MED ORDER — INSULIN ASPART 100 UNIT/ML ~~LOC~~ SOLN
0.0000 [IU] | Freq: Three times a day (TID) | SUBCUTANEOUS | Status: DC
  Administered 2014-08-11: 13:00:00 3 [IU] via SUBCUTANEOUS
  Administered 2014-08-12: 13:00:00 1 [IU] via SUBCUTANEOUS
  Filled 2014-08-10: qty 1
  Filled 2014-08-10: qty 3

## 2014-08-10 MED ORDER — RIFAXIMIN 550 MG PO TABS
550.0000 mg | ORAL_TABLET | Freq: Two times a day (BID) | ORAL | Status: DC
Start: 2014-08-10 — End: 2014-08-12
  Administered 2014-08-10 – 2014-08-12 (×4): 550 mg via ORAL
  Filled 2014-08-10 (×5): qty 1

## 2014-08-10 MED ORDER — INSULIN ASPART 100 UNIT/ML ~~LOC~~ SOLN: 0.0000 [IU] | Freq: Every day | SUBCUTANEOUS | Status: DC

## 2014-08-10 MED ORDER — PANTOPRAZOLE SODIUM 40 MG PO TBEC
40.0000 mg | DELAYED_RELEASE_TABLET | Freq: Two times a day (BID) | ORAL | Status: DC
  Administered 2014-08-10 – 2014-08-12 (×4): 40 mg via ORAL
  Filled 2014-08-10 (×4): qty 1

## 2014-08-10 MED ORDER — NICOTINE 10 MG IN INHA: 1.0000 | RESPIRATORY_TRACT | Status: DC | PRN

## 2014-08-10 MED ORDER — NICOTINE 21 MG/24HR TD PT24
21.0000 mg | MEDICATED_PATCH | Freq: Every day | TRANSDERMAL | Status: DC
  Administered 2014-08-11: 09:00:00 21 mg via TRANSDERMAL
  Filled 2014-08-10 (×3): qty 1

## 2014-08-10 MED ORDER — FOLIC ACID 1 MG PO TABS
1.0000 mg | ORAL_TABLET | Freq: Every day | ORAL | Status: DC
  Administered 2014-08-10 – 2014-08-12 (×3): 1 mg via ORAL
  Filled 2014-08-10 (×3): qty 1

## 2014-08-10 MED ORDER — ADULT MULTIVITAMIN W/MINERALS CH
1.0000 | ORAL_TABLET | Freq: Every day | ORAL | Status: DC
  Administered 2014-08-10 – 2014-08-12 (×3): 1 via ORAL
  Filled 2014-08-10 (×3): qty 1

## 2014-08-10 MED ORDER — ALPRAZOLAM 1 MG PO TABS
1.0000 mg | ORAL_TABLET | Freq: Every day | ORAL | Status: DC | PRN
  Administered 2014-08-11: 03:00:00 1 mg via ORAL
  Filled 2014-08-10: qty 1

## 2014-08-10 MED ORDER — METHADONE HCL 5 MG PO TABS: 10.0000 mg | ORAL_TABLET | Freq: Three times a day (TID) | ORAL | Status: DC

## 2014-08-10 MED ORDER — TIOTROPIUM BROMIDE MONOHYDRATE 18 MCG IN CAPS
18.0000 ug | ORAL_CAPSULE | Freq: Every day | RESPIRATORY_TRACT | Status: DC
  Administered 2014-08-11 – 2014-08-12 (×2): 18 ug via RESPIRATORY_TRACT
  Filled 2014-08-10: qty 5

## 2014-08-10 MED ORDER — METHADONE HCL 5 MG PO TABS
20.0000 mg | ORAL_TABLET | Freq: Three times a day (TID) | ORAL | Status: DC
  Administered 2014-08-10 – 2014-08-12 (×5): 20 mg via ORAL
  Filled 2014-08-10 (×6): qty 4

## 2014-08-10 MED ORDER — INSULIN ASPART 100 UNIT/ML ~~LOC~~ SOLN
3.0000 [IU] | Freq: Three times a day (TID) | SUBCUTANEOUS | Status: DC
Start: 2014-08-11 — End: 2014-08-11

## 2014-08-10 MED ORDER — NITROGLYCERIN 2 % TD OINT
TOPICAL_OINTMENT | TRANSDERMAL | Status: AC
Start: 2014-08-10 — End: 2014-08-10
  Administered 2014-08-10: 1 [in_us] via TOPICAL
  Filled 2014-08-10: qty 1

## 2014-08-10 MED ORDER — LACTULOSE 10 GM/15ML PO SOLN
ORAL | Status: AC
  Administered 2014-08-10: 30 g via ORAL
  Filled 2014-08-10: qty 60

## 2014-08-10 MED ORDER — LACTULOSE 10 GM/15ML PO SOLN
30.0000 g | Freq: Two times a day (BID) | ORAL | Status: DC
  Administered 2014-08-11: 09:00:00 30 g via ORAL
  Administered 2014-08-12: 11:00:00 via ORAL
  Filled 2014-08-10 (×4): qty 60

## 2014-08-10 MED ORDER — IPRATROPIUM-ALBUTEROL 0.5-2.5 (3) MG/3ML IN SOLN: 3.0000 mL | Freq: Four times a day (QID) | RESPIRATORY_TRACT | Status: DC | PRN

## 2014-08-10 MED ORDER — SODIUM CHLORIDE 0.9 % IV BOLUS (SEPSIS)
1000.0000 mL | Freq: Once | INTRAVENOUS | Status: AC
  Administered 2014-08-10: 1000 mL via INTRAVENOUS

## 2014-08-10 MED ORDER — BUDESONIDE-FORMOTEROL FUMARATE 160-4.5 MCG/ACT IN AERO
2.0000 | INHALATION_SPRAY | Freq: Two times a day (BID) | RESPIRATORY_TRACT | Status: DC
  Administered 2014-08-10 – 2014-08-12 (×4): 2 via RESPIRATORY_TRACT
  Filled 2014-08-10: qty 6

## 2014-08-10 NOTE — ED Notes (Signed)
Pt to ultrasound

## 2014-08-10 NOTE — ED Notes (Signed)
Discussed nitro order with admitting doc. She wants to give to him based on his BP. Also verified with pt that he took his AM dose of metroprolol.

## 2014-08-10 NOTE — ED Notes (Signed)
Pt returned from ct

## 2014-08-10 NOTE — ED Notes (Signed)
Pt to ct 

## 2014-08-10 NOTE — Progress Notes (Signed)
Notified Dr Lavetta Nielsen that lactulose given at 1815 in the ED. Pt had a large loose BM, did not make it to the bathroom. Do you want to give 2nd dose of lactulose tonight. MD verbalized ok to give med now.

## 2014-08-10 NOTE — Progress Notes (Signed)
Spoke with Dr Lavetta Nielsen regarding clarification of  Methadone order 10-20mg ?. Pt takes 20mg  Methadone TID per pt. MD verbalized to change the order to 20mg  TID.

## 2014-08-10 NOTE — ED Provider Notes (Signed)
[facility] Emergency Department Provider Note  ____________________________________________  Time seen: 4:15 PM  I have reviewed the triage vital signs and the nursing notes.   HISTORY  Chief Complaint Altered Mental Status    HPI [name] is a 59 y.o. male is brought to the ED today for being confused and shaky. They note that the patient has chronic difficulty with speaking and slurring speech,but he seems tremulous and disoriented for the past 2 or 3 days. They note that he is trying to wean himself off of methadone and Xanax which he takes 3 times a day, his last dose was this morning and he is only been skipping his midday dose for the last 2 weeks. The patient also has hepatitis C and chronic hepatitis and has recently been started on lactulose by primary care to try and prevent confusion or encephalopathy. Patient and family deny any rectal bleeding or black stools.     Past Medical History  Diagnosis Date  . Anxiety unk  . Chronic back pain unk  . Diabetes mellitus without complication   . COPD (chronic obstructive pulmonary disease)    hepatitis C  There are no active problems to display for this patient.   History reviewed. No pertinent past surgical history.  No current outpatient prescriptions on file. Lactulose Printed med list at bedside Allergies Review of patient's allergies indicates no known allergies.  No family history on file.  Social History History  Substance Use Topics  . Smoking status: Current Some Day Smoker  . Smokeless tobacco: Not on file  . Alcohol Use: No    Review of Systems  Constitutional: No fever or chills. No weight changes Eyes:No blurry vision or double vision.  ENT: No sore throat. Cardiovascular: No chest pain. Respiratory: No dyspnea or cough. Gastrointestinal: Negative for abdominal pain, vomiting and diarrhea.  No BRBPR or melena. Genitourinary: Negative for dysuria, urinary  retention, bloody urine, or difficulty urinating. Musculoskeletal: Negative for back pain. No joint swelling or pain. Skin: Negative for rash. Neurological: Negative for headaches, focal weakness or numbness. Involuntary movements and tremor Psychiatric:No anxiety or depression.   Endocrine:No hot/cold intolerance, changes in energy, or sleep difficulty.  10-point ROS otherwise negative.  ____________________________________________   PHYSICAL EXAM:  VITAL SIGNS: ED Triage Vitals  Enc Vitals Group     BP 08/10/14 1440 180/94 mmHg     Pulse Rate 08/10/14 1440 108     Resp 08/10/14 1440 22     Temp 08/10/14 1440 98 F (36.7 C)     Temp Source 08/10/14 1440 Oral     SpO2 08/10/14 1440 92 %     Weight 08/10/14 1440 145 lb (65.772 kg)     Height 08/10/14 1440 5\' 11"  (1.803 m)     Head Cir --      Peak Flow --      Pain Score --      Pain Loc --      Pain Edu? --      Excl. in [place]? --      Constitutional: Alert and oriented to self. Malnourished, no distress Eyes: No scleral icterus. No conjunctival pallor. PERRL. EOMI ENT   Head: Normocephalic and atraumatic.   Nose: No congestion/rhinnorhea. No septal hematoma   Mouth/Throat: Dry mucous membranes, no pharyngeal erythema. No peritonsillar mass. No uvula shift.   Neck: No stridor. No SubQ emphysema. No meningismus. Hematological/Lymphatic/Immunilogical: No cervical lymphadenopathy. Cardiovascular: RRR. Normal and symmetric distal pulses are present in all  extremities. No murmurs, rubs, or gallops. Respiratory: Normal respiratory effort without tachypnea nor retractions. Breath sounds are clear and equal bilaterally. No wheezes/rales/rhonchi. Gastrointestinal: Soft and nontender. No distention. There is no CVA tenderness.  No rebound, rigidity, or guarding. Rectal exam reveals thin brown stool that is Hemoccult positive Genitourinary: deferred Musculoskeletal: Nontender with normal range of motion in all  extremities. No joint effusions.  No lower extremity tenderness.  No edema. Neurologic:   Baseline speech and language.  CN 2-10 normal. Motor grossly intact. Diffuse fine tremor bilateral upper extremities  No gross focal neurologic deficits are appreciated.  Skin:  Skin is warm, dry and intact. No rash noted.  No petechiae, purpura, or bullae. Psychiatric: Mood and affect are normal. Speech and behavior are normal. Patient exhibits appropriate insight and judgment.  ____________________________________________    LABS (pertinent positives/negatives) (all labs ordered are listed, but only abnormal results are displayed) Labs Reviewed  CBC - Abnormal; Notable for the following:    Platelets 104 (*)    All other components within normal limits  COMPREHENSIVE METABOLIC PANEL - Abnormal; Notable for the following:    Glucose, Bld 107 (*)    AST 376 (*)    ALT 400 (*)    Alkaline Phosphatase 148 (*)    All other components within normal limits  GLUCOSE, CAPILLARY - Abnormal; Notable for the following:    Glucose-Capillary 105 (*)    All other components within normal limits  AMMONIA - Abnormal; Notable for the following:    Ammonia 109 (*)    All other components within normal limits  URINALYSIS COMPLETEWITH MICROSCOPIC ([facility] ONLY)  CBG MONITORING, ED   ____________________________________________   EKG  Interpreted by me Sinus tachycardia rate 105, normal axis and intervals, normal QRS, normal ST segments and T waves  ____________________________________________    RADIOLOGY  CT head unremarkable Chest x-ray unremarkable  ____________________________________________   PROCEDURES  ____________________________________________   INITIAL IMPRESSION / ASSESSMENT AND PLAN / ED COURSE  Pertinent labs & imaging results that were available during my care of the patient were reviewed by me and considered in my medical decision making (see chart for  details).  Patient presents with altered mental status manifesting as confusion. He does appear to have a mild GI bleed which, in conjunction with his underlying hepatitis and hepatic impairment, is likely causing hepatic encephalopathy. We'll check labs and an ammonia level as well as CT head to rule out bleed and chest x-ray to evaluate for pneumonia.  ----------------------------------------- 6:05 PM on 08/10/2014 -----------------------------------------  Workup unremarkable except for a markedly elevated ammonia level. His transaminases are also more elevated than prior indicating acute on chronic hepatitis. Evaluation is consistent with hepatic encephalopathy. We'll give the patient lactulose and plan to admit to the hospital for further management.  ____________________________________________   FINAL CLINICAL IMPRESSION(S) / ED DIAGNOSES  Final diagnoses:  Hepatic encephalopathy      Carrie Mew, MD 08/10/14 1806

## 2014-08-10 NOTE — ED Notes (Signed)
Per family he has been shakey   And is sl confused.Marland Kitchen

## 2014-08-10 NOTE — H&P (Addendum)
[place] at [place] NAME: [name]    MR#:  [id number]  DATE OF BIRTH:  12-Sep-1955  DATE OF ADMISSION:  08/10/2014  PRIMARY CARE PHYSICIAN: Theotis Burrow, MD   REQUESTING/REFERRING PHYSICIAN:   CHIEF COMPLAINT:   Chief Complaint  Patient presents with  . Altered Mental Status    HISTORY OF PRESENT ILLNESS: [name]  is a 59 y.o. male with a known history of COPD, diabetes mellitus type 2, hypertension, hepatitis C with liver cirrhosis, also polysubstance abuse who presents to the hospital with complaints of shakiness and confusion. It is difficult to understand how long he's been having problems with disorientation or shakiness, but he tells me that over the past 2 days he's been having problems with worsening tremor. According to emergency room physician. He has been trying to wean himself off methadone and Xanax, skipping midday dose for the past 2 weeks. In emergency room, he was noted to have elevated ammonia level and hospitalist services were contacted for admission. Patient was noted to have guaiac-positive stool which was brown in color on the rectal exam by ER physician.   PAST MEDICAL HISTORY:   Past Medical History  Diagnosis Date  . Anxiety unk  . Chronic back pain unk  . Diabetes mellitus without complication   . COPD (chronic obstructive pulmonary disease)     PAST SURGICAL HISTORY: History reviewed. No pertinent past surgical history.  SOCIAL HISTORY:  History  Substance Use Topics  . Smoking status: Current Some Day Smoker  . Smokeless tobacco: Not on file  . Alcohol Use: No    FAMILY HISTORY: No family history of hypertension, diabetes mellitus, coronary artery disease or stroke  DRUG ALLERGIES: No Known Allergies  Review of Systems  Constitutional: Positive for fever and chills. Negative for weight loss and malaise/fatigue.  HENT: Negative for congestion.   Eyes: Negative for  blurred vision and double vision.  Respiratory: Positive for cough and sputum production. Negative for shortness of breath and wheezing.   Cardiovascular: Negative for chest pain, palpitations, orthopnea, leg swelling and PND.  Gastrointestinal: Positive for diarrhea. Negative for nausea, vomiting, abdominal pain, constipation, blood in stool and melena.  Genitourinary: Negative for dysuria, urgency, frequency and hematuria.  Musculoskeletal: Negative for falls.  Skin: Negative for rash.  Neurological: Negative for dizziness and weakness.  Psychiatric/Behavioral: Negative for depression and memory loss. The patient is not nervous/anxious.     MEDICATIONS AT HOME:  Prior to Admission medications   Medication Sig Start Date End Date Taking? Authorizing Provider  ALPRAZolam Duanne Moron) 1 MG tablet Take 1 mg by mouth daily as needed for anxiety.   Yes Historical Provider, MD  budesonide-formoterol (SYMBICORT) 160-4.5 MCG/ACT inhaler Inhale 2 puffs into the lungs 2 (two) times daily.   Yes Historical Provider, MD  ipratropium-albuterol (DUONEB) 0.5-2.5 (3) MG/3ML SOLN Take 3 mLs by nebulization 4 (four) times daily as needed (for shortness of breath).   Yes Historical Provider, MD  metFORMIN (GLUCOPHAGE) 500 MG tablet Take 500 mg by mouth 2 (two) times daily.   Yes Historical Provider, MD  methadone (DOLOPHINE) 10 MG tablet Take 10-20 mg by mouth 3 (three) times daily.   Yes Historical Provider, MD  metoprolol (LOPRESSOR) 100 MG tablet Take 100 mg by mouth 2 (two) times daily.   Yes Historical Provider, MD  Multiple Vitamins-Minerals (MULTIVITAMIN PO) Take 1 tablet by mouth daily.   Yes Historical Provider, MD  tiotropium (SPIRIVA) 18 MCG inhalation  capsule Place 18 mcg into inhaler and inhale daily.   Yes Historical Provider, MD      PHYSICAL EXAMINATION:   VITAL SIGNS: Blood pressure 164/79, pulse 97, temperature 97.4 F (36.3 C), temperature source Oral, resp. rate 9, height 5\' 11"  (1.803 m),  weight 65.772 kg (145 lb), SpO2 96 %.  GENERAL:  59 y.o.-year-old patient lying in the bed with no acute distress. Confused and shaky, especially whenever he stretches out his arms and flexes his hands, as if to stop traffic.  EYES: Pupils equal, round, reactive to light and accommodation. No scleral icterus. Extraocular muscles intact.  HEENT: Head atraumatic, normocephalic. Oropharynx and nasopharynx clear.  NECK:  Supple, no jugular venous distention. No thyroid enlargement, no tenderness.  LUNGS: Normal breath sounds bilaterally, no wheezing, some rales, few rhonchi rhonchi , no crepitation. No use of accessory muscles of respiration.  CARDIOVASCULAR: S1, S2 normal. No murmurs, rubs, or gallops.  ABDOMEN: Soft, protuberant with questionable fluid wave, nondistended. Bowel sounds present. No organomegaly or mass. Patient has a subcutaneous nodule in epigastric region, which is mildly tender to palpation.  EXTREMITIES: Trace to 1+ lower extremity edema, but no significant pedal edema, cyanosis, or clubbing.  NEUROLOGIC: Cranial nerves II through XII are intact. Muscle strength 5/5 in all extremities. Sensation intact. Gait not checked.  PSYCHIATRIC: The patient is alert , intermittently confused.  SKIN: No obvious rash, lesion, or ulcer.   LABORATORY PANEL:   CBC  Recent Labs Lab 08/10/14 1457  WBC 8.4  HGB 15.2  HCT 45.2  PLT 104*  MCV 98.4  MCH 33.0  MCHC 33.6  RDW 13.1   ------------------------------------------------------------------------------------------------------------------  Chemistries   Recent Labs Lab 08/10/14 1457  NA 143  K 4.3  CL 106  CO2 23  GLUCOSE 107*  BUN 16  CREATININE 0.92  CALCIUM 10.0  AST 376*  ALT 400*  ALKPHOS 148*  BILITOT 1.1   ------------------------------------------------------------------------------------------------------------------  Cardiac Enzymes No results for input(s): TROPONINI in the last 168  hours. ------------------------------------------------------------------------------------------------------------------  RADIOLOGY: Dg Chest 2 View  08/10/2014   CLINICAL DATA:  Dizziness and weakness today.  EXAM: CHEST  2 VIEW  COMPARISON:  06/13/2014.  FINDINGS: The cardiac silhouette, mediastinal and hilar contours are within normal limits and stable. There is mild tortuosity and calcification of the thoracic aorta. The lungs are clear of acute process. No pleural effusion or pneumothorax. Artifact from EKG leads are noted. No definite pulmonary lesions. The bony thorax is intact. Stable mild compression deformity in the lower thoracic spine.  IMPRESSION: No acute cardiopulmonary findings.   Electronically Signed   By: Marijo Sanes M.D.   On: 08/10/2014 17:55   Ct Head Wo Contrast  08/10/2014   CLINICAL DATA:  Altered mental status.  Cirrhosis.  EXAM: CT HEAD WITHOUT CONTRAST  TECHNIQUE: Contiguous axial images were obtained from the base of the skull through the vertex without intravenous contrast.  COMPARISON:  109-22-2015 head CT.  FINDINGS: No mass lesion, mass effect, midline shift, hydrocephalus, hemorrhage. No acute territorial cortical ischemia/infarct. Atrophy and chronic ischemic white matter disease is present. Mastoid air cells and paranasal sinuses are within normal limits. Rightward nasal septal deviation.  IMPRESSION: Mild atrophy and chronic ischemic white matter disease without acute intracranial abnormality.   Electronically Signed   By: Dereck Ligas M.D.   On: 08/10/2014 17:54    EKG: Orders placed or performed during the hospital encounter of 08/10/14  . ED EKG  . ED EKG   EKG showed  sinus tachycardia at 105 bpm, normal axis, no acute ST-T changes.   IMPRESSION AND PLAN:  Principal Problem:   Hepatic encephalopathy Active Problems:   HTN (hypertension)   Hepatitis C   Polysubstance abuse  * Hepatic encephalopathy. Admit patient medical floor, start him on lecture  low-dose orally. Also, Xifaxan, following clinically.  *Malignant essential hypertension. Will continue patient on metoprolol and nitroglycerin topically, adjust medications according to blood pressure needs *Suspected ascites. Get ultrasound of abdomen if it was not done recently * Elevated transaminases of unclear etiology, very likely related to liver cirrhosis. However, patient's AST as well as ALT were below 200s in the past. I'm suspecting the patient is drinking alcohol or using other substances, which could affect his liver function.  * Guaiac-positive stool. We will initiate patient on Protonix orally and we will ask the gastroenterologist to see him in consultation and hemoglobin levels frequently * Tobacco abuse counseling,  initiate patient on nicotine replacement therapy. He is agreeable. Discussed with him for 3 minutes All the records are reviewed and case discussed with ED provider. Management plans discussed with the patient, family and they are in agreement.  CODE STATUS:    TOTAL TIME TAKING CARE OF THIS PATIENT: 55 minutes.    Theodoro Grist M.D on 08/10/2014 at 6:51 PM  Between 7am to 6pm - Pager - [phone] After 6pm go to [email or web address] - password [email or web address] [place] [facility]  Office  [phone]  CC: Primary care physician; Theotis Burrow, MD

## 2014-08-11 LAB — URINE DRUG SCREEN, QUALITATIVE (ARMC ONLY)
AMPHETAMINES, UR SCREEN: NOT DETECTED
Barbiturates, Ur Screen: NOT DETECTED
Benzodiazepine, Ur Scrn: POSITIVE — AB
CANNABINOID 50 NG, UR ~~LOC~~: NOT DETECTED
COCAINE METABOLITE, UR ~~LOC~~: NOT DETECTED
MDMA (Ecstasy)Ur Screen: NOT DETECTED
METHADONE SCREEN, URINE: POSITIVE — AB
OPIATE, UR SCREEN: NOT DETECTED
Phencyclidine (PCP) Ur S: NOT DETECTED
Tricyclic, Ur Screen: NOT DETECTED

## 2014-08-11 LAB — GLUCOSE, CAPILLARY
GLUCOSE-CAPILLARY: 84 mg/dL (ref 65–99)
Glucose-Capillary: 213 mg/dL — ABNORMAL HIGH (ref 65–99)
Glucose-Capillary: 117 mg/dL — ABNORMAL HIGH (ref 65–99)
Glucose-Capillary: 137 mg/dL — ABNORMAL HIGH (ref 65–99)

## 2014-08-11 LAB — ACETAMINOPHEN LEVEL

## 2014-08-11 LAB — CBC
HEMATOCRIT: 42.2 % (ref 40.0–52.0)
Hemoglobin: 14.5 g/dL (ref 13.0–18.0)
MCH: 33.5 pg (ref 26.0–34.0)
MCHC: 34.3 g/dL (ref 32.0–36.0)
MCV: 97.6 fL (ref 80.0–100.0)
Platelets: 110 10*3/uL — ABNORMAL LOW (ref 150–440)
RBC: 4.32 MIL/uL — ABNORMAL LOW (ref 4.40–5.90)
RDW: 13 % (ref 11.5–14.5)
WBC: 7.9 10*3/uL (ref 3.8–10.6)

## 2014-08-11 LAB — BASIC METABOLIC PANEL
ANION GAP: 6 (ref 5–15)
BUN: 16 mg/dL (ref 6–20)
CO2: 25 mmol/L (ref 22–32)
Calcium: 9.6 mg/dL (ref 8.9–10.3)
Chloride: 110 mmol/L (ref 101–111)
Creatinine, Ser: 0.71 mg/dL (ref 0.61–1.24)
GFR calc Af Amer: >60 mL/min (ref >60)
GLUCOSE: 104 mg/dL — AB (ref 65–99)
Potassium: 4.2 mmol/L (ref 3.5–5.1)
SODIUM: 141 mmol/L (ref 135–145)

## 2014-08-11 LAB — AMMONIA: Ammonia: 47 umol/L — ABNORMAL HIGH (ref 9–35)

## 2014-08-11 LAB — HEPATIC FUNCTION PANEL
ALT: 359 U/L — ABNORMAL HIGH (ref 17–63)
AST: 371 U/L — AB (ref 15–41)
Albumin: 3.4 g/dL — ABNORMAL LOW (ref 3.5–5.0)
Alkaline Phosphatase: 105 U/L (ref 38–126)
Bilirubin, Direct: 0.8 mg/dL — ABNORMAL HIGH (ref 0.1–0.5)
Indirect Bilirubin: 1.8 mg/dL — ABNORMAL HIGH (ref 0.3–0.9)
TOTAL PROTEIN: 7.2 g/dL (ref 6.5–8.1)
Total Bilirubin: 2.6 mg/dL — ABNORMAL HIGH (ref 0.3–1.2)

## 2014-08-11 MED ORDER — METFORMIN HCL 500 MG PO TABS
500.0000 mg | ORAL_TABLET | Freq: Two times a day (BID) | ORAL | Status: DC
  Administered 2014-08-11: 500 mg via ORAL
  Filled 2014-08-11: qty 1

## 2014-08-11 MED ORDER — ALPRAZOLAM 1 MG PO TABS
1.0000 mg | ORAL_TABLET | Freq: Three times a day (TID) | ORAL | Status: DC | PRN
  Administered 2014-08-11 – 2014-08-12 (×3): 1 mg via ORAL
  Filled 2014-08-11 (×3): qty 1

## 2014-08-11 NOTE — Plan of Care (Addendum)
Problem: Discharge Progression Outcomes Goal: Discharge plan in place and appropriate Individualization: Pt lives with his parents. Moderate fall risk- Offer toileting qx1hr with safety checks. 1xassist to the bathroom. H/O HTN, DM, COPD, polysubstance abuse, anxiety, hepatitis C controlled with home meds.

## 2014-08-11 NOTE — Progress Notes (Signed)
   08/11/14 1345  Clinical Encounter Type  Visited With Patient and family together  Visit Type Initial  Consult/Referral To Chaplain  Stress Factors  Family Stress Factors Lack of knowledge;Health changes  Visited with patient and family.  Patient's niece came in and introduced herself as his 78.  I asked patient how he was doing and he told me he was doing fine.  Family explained that he had just been brought in the night before and they were waiting for the doctor to come in and give them some information.  Patient's niece asked me to step out into the hall and speak to her.  She appeared a bit nervous or perhaps paranoid as to why a chaplain was visiting her uncle.  She informed me that the nursing staff was unable to confirm her identity in their records and was unable to give her any information.  She also expressed concern that my presence as a chaplain meant that her uncle was not doing well.  I explained to her that I was visiting patients on my unit and that her uncle was on my floor.  I understood her concern, but I assured her that I was only visiting to introduce myself and see how the patient was doing.  [place] [phone]

## 2014-08-11 NOTE — Plan of Care (Addendum)
Problem: Discharge Progression Outcomes Goal: Other Discharge Outcomes/Goals Outcome: Progressing No discharge plans at this time, VSS, alert and oriented, gets very agitated and anxious at times, diet advanced to 2 gram sodium diet and tolerating well, amonia level improved today, continues to get lactulose, awaiting visit from GI due to hepatic disease and increased amonia levels and positive occult stool. Vickey Huger GI NP seen patient this evening ordered urine drug screen, no other orders.

## 2014-08-11 NOTE — Progress Notes (Signed)
Initial Nutrition Assessment  DOCUMENTATION CODES:     INTERVENTION: Meals and Snacks: Cater to patient preferences Medical Food Supplement Therapy: will recommend supplement on follow if intake poor    NUTRITION DIAGNOSIS:  None at this time  GOAL:  Patient will meet greater than or equal to 90% of their needs  MONITOR:   (Energy Intake, Hepatic Profile, Digestive System)  REASON FOR ASSESSMENT:   (RD Screen, Diagnosis)    ASSESSMENT:  Pt admitted with hepatic encephalopathy.  PMHx:  Past Medical History  Diagnosis Date  . Anxiety unk  . Chronic back pain unk  . Diabetes mellitus without complication   . COPD (chronic obstructive pulmonary disease)    PO Intake: pt ate 100% of breakfast this am including french toast, banana and grits. Pt reports healthy appetite PTA eating 2-3 meals per day.  Medications: Novolog, Folic acid, MVI, Protonix, Xifaxan Labs: Electrolyte and Renal Profile:  Recent Labs Lab 08/10/14 1457 08/11/14 0538  BUN 16 16  CREATININE 0.92 0.71  NA 143 141  K 4.3 4.2   Glucose Profile:  Recent Labs  08/10/14 2120 08/11/14 0705 08/11/14 1112  GLUCAP 104* 84 213*   Protein Profile:  Recent Labs Lab 08/10/14 1457 08/11/14 0854  ALBUMIN 3.6 3.4*   Hepatic Function Latest Ref Rng 08/11/2014 08/10/2014 06/14/2014  Total Protein 6.5 - 8.1 g/dL 7.2 7.8 6.2(L)  Albumin 3.5 - 5.0 g/dL 3.4(L) 3.6 2.6(L)  AST 15 - 41 U/L 371(H) 376(H) 107(H)  ALT 17 - 63 U/L 359(H) 400(H) 137(H)  Alk Phosphatase 38 - 126 U/L 105 148(H) 105  Total Bilirubin 0.3 - 1.2 mg/dL 2.6(H) 1.1 -  Bilirubin, Direct 0.1 - 0.5 mg/dL 0.8(H) - -    Pt reports stable weight PTA.  Height:  Ht Readings from Last 1 Encounters:  08/10/14 '5\' 10"'  (1.778 m)    Weight:  Wt Readings from Last 1 Encounters:  08/11/14 148 lb 6.4 oz (67.314 kg)    Ideal Body Weight:     Wt Readings from Last 10 Encounters:  08/11/14 148 lb 6.4 oz (67.314 kg)    BMI:  Body mass  index is 21.29 kg/(m^2).  Skin:  Reviewed, no issues  Diet Order:  Diet 2 gram sodium Room service appropriate?: Yes; Fluid consistency:: Thin  EDUCATION NEEDS:  No education needs identified at this time   Intake/Output Summary (Last 24 hours) at 08/11/14 1254 Last data filed at 08/11/14 0205  Gross per 24 hour  Intake      0 ml  Output    600 ml  Net   -600 ml    Last BM:  6/2 loose BM  LOW Care Level  Dwyane Luo, RD, LDN Pager [phone]

## 2014-08-11 NOTE — Progress Notes (Signed)
[facility] NAME: [name]    MR#:  [id number]  DATE OF BIRTH:  02-27-1956  SUBJECTIVE:  CHIEF COMPLAINT:   Chief Complaint  Patient presents with  . Altered Mental Status   - confusion improving, pt anxious too. - complaining about his liquid diet and wants to eat solid food  REVIEW OF SYSTEMS:  Review of Systems  Constitutional: Negative for fever and chills.  Respiratory: Negative for cough, shortness of breath and wheezing.   Cardiovascular: Negative for chest pain and palpitations.  Gastrointestinal: Negative for nausea, vomiting, abdominal pain, diarrhea and constipation.  Genitourinary: Negative for dysuria.  Neurological: Positive for tremors. Negative for dizziness, seizures and headaches.  Psychiatric/Behavioral:       Confusion present    DRUG ALLERGIES:  No Known Allergies  VITALS:  Blood pressure 152/66, pulse 83, temperature 98.5 F (36.9 C), temperature source Oral, resp. rate 18, height 5\' 10"  (1.778 m), weight 67.314 kg (148 lb 6.4 oz), SpO2 95 %.  PHYSICAL EXAMINATION:  Physical Exam  GENERAL:  59 y.o.-year-old patient lying in the bed with no acute distress.  EYES: Pupils equal, round, reactive to light and accommodation. No scleral icterus. Extraocular muscles intact.  HEENT: Head atraumatic, normocephalic. Oropharynx and nasopharynx clear.  NECK:  Supple, no jugular venous distention. No thyroid enlargement, no tenderness.  LUNGS: Normal breath sounds bilaterally, no wheezing, rales,rhonchi or crepitation. No use of accessory muscles of respiration.  CARDIOVASCULAR: S1, S2 normal. No murmurs, rubs, or gallops.  ABDOMEN: Soft, nontender, nondistended. Bowel sounds present. No organomegaly or mass.  EXTREMITIES: No pedal edema, cyanosis, or clubbing.  NEUROLOGIC: Cranial nerves II through XII are intact. Muscle strength 5/5 in all extremities. Sensation intact. Gait not checked.   PSYCHIATRIC: The patient is alert and oriented x 3.  SKIN: No obvious rash, lesion, or ulcer.    LABORATORY PANEL:   CBC  Recent Labs Lab 08/11/14 0538  WBC 7.9  HGB 14.5  HCT 42.2  PLT 110*   ------------------------------------------------------------------------------------------------------------------  Chemistries   Recent Labs Lab 08/11/14 0538 08/11/14 0854  NA 141  --   K 4.2  --   CL 110  --   CO2 25  --   GLUCOSE 104*  --   BUN 16  --   CREATININE 0.71  --   CALCIUM 9.6  --   AST  --  371*  ALT  --  359*  ALKPHOS  --  105  BILITOT  --  2.6*   ------------------------------------------------------------------------------------------------------------------  Cardiac Enzymes No results for input(s): TROPONINI in the last 168 hours. ------------------------------------------------------------------------------------------------------------------  RADIOLOGY:  Dg Chest 2 View  08/10/2014   CLINICAL DATA:  Dizziness and weakness today.  EXAM: CHEST  2 VIEW  COMPARISON:  06/13/2014.  FINDINGS: The cardiac silhouette, mediastinal and hilar contours are within normal limits and stable. There is mild tortuosity and calcification of the thoracic aorta. The lungs are clear of acute process. No pleural effusion or pneumothorax. Artifact from EKG leads are noted. No definite pulmonary lesions. The bony thorax is intact. Stable mild compression deformity in the lower thoracic spine.  IMPRESSION: No acute cardiopulmonary findings.   Electronically Signed   By: Marijo Sanes M.D.   On: 08/10/2014 17:55   Ct Head Wo Contrast  08/10/2014   CLINICAL DATA:  Altered mental status.  Cirrhosis.  EXAM: CT HEAD WITHOUT CONTRAST  TECHNIQUE: Contiguous axial images were obtained from the base of the  skull through the vertex without intravenous contrast.  COMPARISON:  101/30/202015 head CT.  FINDINGS: No mass lesion, mass effect, midline shift, hydrocephalus, hemorrhage. No acute territorial  cortical ischemia/infarct. Atrophy and chronic ischemic white matter disease is present. Mastoid air cells and paranasal sinuses are within normal limits. Rightward nasal septal deviation.  IMPRESSION: Mild atrophy and chronic ischemic white matter disease without acute intracranial abnormality.   Electronically Signed   By: Dereck Ligas M.D.   On: 08/10/2014 17:54   US Abdomen Limited  08/10/2014   CLINICAL DATA:  Hepatitis-C infection and alcoholism.  EXAM: LIMITED ABDOMEN ULTRASOUND FOR ASCITES  TECHNIQUE: Limited ultrasound survey for ascites was performed in all four abdominal quadrants.  COMPARISON:  None.  FINDINGS: Four quadrant survey shows no ascites in the peritoneal cavity. Solid organs were not evaluated.  IMPRESSION: No ascites identified in the peritoneal cavity.   Electronically Signed   By: Aletta Edouard M.D.   On: 08/10/2014 19:42    EKG:   Orders placed or performed during the hospital encounter of 08/10/14  . ED EKG  . ED EKG  . EKG 12-Lead  . EKG 12-Lead    ASSESSMENT AND PLAN:   59y/oM with PMH of COPD, Hep C, Liver cirrhosis, DM, HTN admitted for confusion and hepatic encephalopathy  * Hepatic encephalopathy- confusion is improving - ammonia is improving- cont lactulose bid and xifaxan - CT head is negative for ay acute changes  * Malignant HTN- elevated, but better today. On metoprolol and nitroglycerin patch. We'll continue for now. If Continues to be elevated, will add Norvasc.  * Elevated LFTs-E times elevated than baseline. Patient denies any active alcohol use. But acknowledges that he might be using cocaine and marijuana. Urine tox screen was not done on admission. -Ultrasound of the abdomen done-and no ascites noted. -Continue to monitor for now. Avoid hepatotoxins.  * COPD-stable, cont home inh  * Chronic pain syndrome- on methadone TID- watch carefully with his confusion especially  * Guiac positive stools- hb slight drop, but at baseline No  active bleeding, hb upto 13 Cont PO protonix  * DVT prophylaxis- TEDs, SCDs  * Anxiety- on xanax prn   All the records are reviewed and case discussed with Care Management/Social Workerr. Management plans discussed with the patient, family and they are in agreement.  CODE STATUS: Full Code   TOTAL TIME TAKING CARE OF THIS PATIENT: 37 minutes.   POSSIBLE D/C IN 1-2 DAYS, DEPENDING ON CLINICAL CONDITION.   Gladstone Lighter M.D on 08/11/2014 at 11:40 AM  Between 7am to 6pm - Pager - [phone]  After 6pm go to [email or web address] - password [email or web address] [facility]  Office  [phone]  CC: Primary care physician; Theotis Burrow, MD

## 2014-08-11 NOTE — Consult Note (Signed)
Gastroenterology Consultation  Referring Provider:    Dr Tressia Miners  Primary Care Physician:  Theotis Burrow, MD Primary Gastroenterologist:  N/A        Reason for Consultation:     Cirrhosis, heme positive stool  Date of Admission:  08/10/2014 Date of Consultation:  08/11/2014        HPI:   [name] is a 59 y.o. male with history of COPD, diabetes mellitus, hypertension, hepatitis C with liver cirrhosis, & polysubstance abuse who presented to the hospital with complaints of shakiness and confusion.  He has noted tremors.  He tells me he was diagnosed over 15 years ago with hepatitis C, has never been treated.  He tells me he has not drank ETOH in 10 years.  He says he quit cocaine 6 months ago.  He occasionally feels a knot in his upper abdomen.  Denies heartburn, indigestion, nausea, vomiting, dysphagia, odynophagia or anorexia.  Denies constipation, diarrhea, rectal bleeding, melena or weight loss.  He admits to confusion.  Ammonia was 109 yesterday now 47.  INR 1.2. HGB normal, Platelets 110.  He has chronic transaminitis, but AST/ALT have more than doubled since April.  He has been trying to wean himself off methadone and Xanax, skipping midday dose for the past 2 weeks.  Component     Latest Ref Rng 02/25/2014 06/13/2014 06/14/2014 08/10/2014 08/11/2014  Total Protein     6.5 - 8.1 g/dL 6.0 (L) 7.1 6.2 (L) 7.8 7.2  Albumin     3.5 - 5.0 g/dL 2.1 (L) 3.4 (L) 2.6 (L) 3.6 3.4 (L)  AST     15 - 41 U/L 162 (H) 166 (H) 107 (H) 376 (H) 371 (H)  ALT     17 - 63 U/L 159 (H) 191 (H) 137 (H) 400 (H) 359 (H)  Alkaline Phosphatase     38 - 126 U/L 176 (H) 124 105 148 (H) 105  Total Bilirubin     0.3 - 1.2 mg/dL 1.7 (H) 1.2 0.6 1.1 2.6 (H)  Bilirubin, Direct     0.1 - 0.5 mg/dL     0.8 (H)  Indirect Bilirubin     0.3 - 0.9 mg/dL     1.8 (H)   Past Medical History  Diagnosis Date  . Anxiety unk  . Chronic back pain unk  . Diabetes mellitus without complication   . COPD (chronic  obstructive pulmonary disease)     History reviewed. No pertinent past surgical history.  Prior to Admission medications   Medication Sig Start Date End Date Taking? Authorizing Provider  ALPRAZolam Duanne Moron) 1 MG tablet Take 1 mg by mouth daily as needed for anxiety.   Yes Historical Provider, MD  budesonide-formoterol (SYMBICORT) 160-4.5 MCG/ACT inhaler Inhale 2 puffs into the lungs 2 (two) times daily.   Yes Historical Provider, MD  ipratropium-albuterol (DUONEB) 0.5-2.5 (3) MG/3ML SOLN Take 3 mLs by nebulization 4 (four) times daily as needed (for shortness of breath).   Yes Historical Provider, MD  metFORMIN (GLUCOPHAGE) 500 MG tablet Take 500 mg by mouth 2 (two) times daily.   Yes Historical Provider, MD  methadone (DOLOPHINE) 10 MG tablet Take 10-20 mg by mouth 3 (three) times daily.   Yes Historical Provider, MD  metoprolol (LOPRESSOR) 100 MG tablet Take 100 mg by mouth 2 (two) times daily.   Yes Historical Provider, MD  Multiple Vitamins-Minerals (MULTIVITAMIN PO) Take 1 tablet by mouth daily.   Yes Historical Provider, MD  tiotropium (SPIRIVA) 18 MCG inhalation  capsule Place 18 mcg into inhaler and inhale daily.   Yes Historical Provider, MD    No family history on file. There is no known family history of colorectal carcinoma , liver disease, or inflammatory bowel disease.  History  Substance Use Topics  . Smoking status: Current Some Day Smoker  . Smokeless tobacco: Not on file  . Alcohol Use: No    Allergies as of 08/10/2014  . (No Known Allergies)    Review of Systems:    All systems reviewed and negative except where noted in HPI.   Physical Exam:  Vital signs in last 24 hours: Temp:  [97.8 F (36.6 C)-98.5 F (36.9 C)] 97.9 F (36.6 C) (06/02 1414) Pulse Rate:  [67-104] 67 (06/02 1611) Resp:  [18-20] 20 (06/02 1414) BP: (124-178)/(66-92) 128/68 mmHg (06/02 1611) SpO2:  [94 %-96 %] 95 % (06/02 1414) Weight:  [62.823 kg (138 lb 8 oz)-67.314 kg (148 lb 6.4 oz)]  67.314 kg (148 lb 6.4 oz) (06/02 0500) Last BM Date: 08/10/14 Body mass index is 21.29 kg/(m^2). General:   Alert,  Well-developed, thin, pleasant and cooperative in NAD Head:  Normocephalic and atraumatic. Eyes:  Sclera clear, no icterus.   Conjunctiva pink. Ears:  Normal auditory acuity. Nose:  No deformity, discharge, or lesions. Mouth:  No deformity or lesions,oropharynx pink & moist. Neck:  Supple; no masses or thyromegaly. Lungs:  Respirations even and unlabored.  Clear throughout to auscultation.   No wheezes, crackles, or rhonchi. No acute distress. Heart:  Regular rate and rhythm; no murmurs, clicks, rubs, or gallops. Abdomen:  Normal bowel sounds.  No bruits.  Soft, non-tender and non-distended without masses.  +hepatosplenomegaly.  No hernias noted.  No guarding or rebound tenderness.  No tense ascites.   Rectal:  Deferred. Msk:  Symmetrical without gross deformities.  Good, equal movement & strength bilaterally.   Pulses:  Normal pulses noted. Extremities:  + clubbing. No edema.  No cyanosis.  No asterixis. Neurologic:  Alert and oriented x3;  grossly normal neurologically. Skin:  Intact without significant lesions or rashes.  No jaundice. Lymph Nodes:  No significant cervical adenopathy. Psych:  Alert and cooperative. Normal mood and affect.  LAB RESULTS:  Recent Labs  08/10/14 1457 08/11/14 0538  WBC 8.4 7.9  HGB 15.2 14.5  HCT 45.2 42.2  PLT 104* 110*   BMET  Recent Labs  08/10/14 1457 08/11/14 0538  NA 143 141  K 4.3 4.2  CL 106 110  CO2 23 25  GLUCOSE 107* 104*  BUN 16 16  CREATININE 0.92 0.71  CALCIUM 10.0 9.6   LFT  Recent Labs  08/11/14 0854  PROT 7.2  ALBUMIN 3.4*  AST 371*  ALT 359*  ALKPHOS 105  BILITOT 2.6*  BILIDIR 0.8*  IBILI 1.8*   PT/INR No results for input(s): LABPROT, INR in the last 72 hours.  STUDIES: Dg Chest 2 View  08/10/2014   CLINICAL DATA:  Dizziness and weakness today.  EXAM: CHEST  2 VIEW  COMPARISON:   06/13/2014.  FINDINGS: The cardiac silhouette, mediastinal and hilar contours are within normal limits and stable. There is mild tortuosity and calcification of the thoracic aorta. The lungs are clear of acute process. No pleural effusion or pneumothorax. Artifact from EKG leads are noted. No definite pulmonary lesions. The bony thorax is intact. Stable mild compression deformity in the lower thoracic spine.  IMPRESSION: No acute cardiopulmonary findings.   Electronically Signed   By: Ricky Stabs.D.  On: 08/10/2014 17:55   Ct Head Wo Contrast  08/10/2014   CLINICAL DATA:  Altered mental status.  Cirrhosis.  EXAM: CT HEAD WITHOUT CONTRAST  TECHNIQUE: Contiguous axial images were obtained from the base of the skull through the vertex without intravenous contrast.  COMPARISON:  105-08-202015 head CT.  FINDINGS: No mass lesion, mass effect, midline shift, hydrocephalus, hemorrhage. No acute territorial cortical ischemia/infarct. Atrophy and chronic ischemic white matter disease is present. Mastoid air cells and paranasal sinuses are within normal limits. Rightward nasal septal deviation.  IMPRESSION: Mild atrophy and chronic ischemic white matter disease without acute intracranial abnormality.   Electronically Signed   By: Dereck Ligas M.D.   On: 08/10/2014 17:54   US Abdomen Limited  08/10/2014   CLINICAL DATA:  Hepatitis-C infection and alcoholism.  EXAM: LIMITED ABDOMEN ULTRASOUND FOR ASCITES  TECHNIQUE: Limited ultrasound survey for ascites was performed in all four abdominal quadrants.  COMPARISON:  None.  FINDINGS: Four quadrant survey shows no ascites in the peritoneal cavity. Solid organs were not evaluated.  IMPRESSION: No ascites identified in the peritoneal cavity.   Electronically Signed   By: Aletta Edouard M.D.   On: 08/10/2014 19:42     Impression / Plan:   [name] is a 59 y.o. y/o male with chronic hepatitis C, genotype unknown, with cirrhosis complicated by hepatic encephalopathy,  thrombocytopenia & hx polysubstance abuse.  MELD 11 (6% 3 month mortality).  No significant ascites on limited ultrasound.  No recent follow-up for cirrhosis. His Hgb is normal & there are no signs of active GI bleeding despite his heme positive stool.   Plan: 1) UDS 2) Hepatitis A/B/C, HCV RNA & genotype, CBC, LFTS, INR in AM 3) Will need outpatient EGD to screen for esophageal varicies unless active bleeding while inpatient 4) Abdominal ultrasound & AFP to screen for [place] 5) Continue lactulose & titrate to 2-4 BMs daily    Thank you for involving me in the care of this patient.     LOS: 1 day  Vickey Huger, NP  08/11/2014, 5:21 PM [facility]  [address], [place] [address] Phone: [phone] Fax : [phone]

## 2014-08-11 NOTE — Plan of Care (Addendum)
Problem: Discharge Progression Outcomes Goal: Discharge plan in place and appropriate Individualization:  Outcome: Progressing Pt lives with his parents. Moderate fall risk- Offer toileting qx1hr with safety checks. 1xassist to the bathroom. H/O HTN, DM, COPD, polysubstance abuse, anxiety, hepatitis C controlled with home meds. Pt declined smoking cessation information.    Goal: Other Discharge Outcomes/Goals Plan of care progress to goals: Pt a&o. Bilateral tremors in hands. Slurred speech. Restless at times/anxious, alprazolam given with good effect. BP managed with nitroglycerin and metoprolol. Denies pain. Large loose stool, pt refused 2nd dose of lactulose.Up to the bathroom with 1xassist.

## 2014-08-11 NOTE — Care Management (Signed)
Admitted to [facility] with the diagnosis of Hepatic Encephalopathy. Lives with parents. Mother is Mabel [phone]). Niece is Lauren [phone]).  Sees Dr. William Hamburger per niece. Last seen 2 weeks ago. Family members at the bedside.  No home health. No skilled facility. Uses no aids for ambulation. Niece states that Mr. Bua was in a rehabilitation center in [place], [place] x 1 month January 2016.  Mr. Tugwell answers some questions appropriately. Shelbie Ammons RN MSN Care Management [phone]

## 2014-08-11 NOTE — Progress Notes (Addendum)
Notified Dr Lavetta Nielsen that pt's BP 124/63, HR 67. Pt is due for nitroglycerin oint and metoprolol. MD verbalized to d/c nitroglycerin order.

## 2014-08-12 ENCOUNTER — Inpatient Hospital Stay: Payer: Medicaid Other

## 2014-08-12 LAB — PROTIME-INR
INR: 1.14
Prothrombin Time: 14.8 seconds (ref 11.4–15.0)

## 2014-08-12 LAB — GLUCOSE, CAPILLARY
Glucose-Capillary: 112 mg/dL — ABNORMAL HIGH (ref 65–99)
Glucose-Capillary: 128 mg/dL — ABNORMAL HIGH (ref 65–99)

## 2014-08-12 LAB — HEPATIC FUNCTION PANEL
ALT: 339 U/L — ABNORMAL HIGH (ref 17–63)
AST: 352 U/L — AB (ref 15–41)
Albumin: 2.9 g/dL — ABNORMAL LOW (ref 3.5–5.0)
Alkaline Phosphatase: 95 U/L (ref 38–126)
BILIRUBIN TOTAL: 1.5 mg/dL — AB (ref 0.3–1.2)
Bilirubin, Direct: 0.6 mg/dL — ABNORMAL HIGH (ref 0.1–0.5)
Indirect Bilirubin: 0.9 mg/dL (ref 0.3–0.9)
Total Protein: 6.6 g/dL (ref 6.5–8.1)

## 2014-08-12 LAB — AMMONIA: Ammonia: 92 umol/L — ABNORMAL HIGH (ref 9–35)

## 2014-08-12 MED ORDER — RIFAXIMIN 550 MG PO TABS: 550.0000 mg | ORAL_TABLET | Freq: Two times a day (BID) | ORAL | Status: AC

## 2014-08-12 MED ORDER — METOPROLOL TARTRATE 100 MG PO TABS: 100.0000 mg | ORAL_TABLET | Freq: Two times a day (BID) | ORAL | Status: DC

## 2014-08-12 MED ORDER — AMLODIPINE BESYLATE 5 MG PO TABS: 5.0000 mg | ORAL_TABLET | Freq: Every day | ORAL | Status: DC

## 2014-08-12 MED ORDER — AMLODIPINE BESYLATE 5 MG PO TABS
5.0000 mg | ORAL_TABLET | Freq: Every day | ORAL | Status: DC
  Administered 2014-08-12: 5 mg via ORAL
  Filled 2014-08-12: qty 1

## 2014-08-12 MED ORDER — PANTOPRAZOLE SODIUM 40 MG PO TBEC: 40.0000 mg | DELAYED_RELEASE_TABLET | Freq: Every day | ORAL | Status: DC

## 2014-08-12 MED ORDER — LACTULOSE 10 GM/15ML PO SOLN: 30.0000 g | Freq: Two times a day (BID) | ORAL | Status: DC

## 2014-08-12 NOTE — Progress Notes (Addendum)
All discharge instructions reviewed with Patient and Mother over the phone.  IV discontinued all paperwork signed.  Patient discharged home with Mother via personal vehicle.  Patient remains Alert and Oriented x 4 now, ammonia level has improved.  No other needs at this time.  Instructed Patient and Family on the importance of compliance with lactulose to keep ammonia levels at a safe level.

## 2014-08-12 NOTE — Plan of Care (Signed)
Problem: Discharge Progression Outcomes Goal: Other Discharge Outcomes/Goals Outcome: Progressing Patient remains alert and oriented x 4, eating 100 % of meals, taking lactulose as prescribed.  May be planning to discharge today.

## 2014-08-12 NOTE — Plan of Care (Signed)
Problem: Discharge Progression Outcomes Goal: Discharge plan in place and appropriate Individualization:  Pt lives with his parents. Moderate fall risk- Offer toileting qx1hr with safety checks. 1xassist to the bathroom. H/O HTN, DM, COPD, polysubstance abuse, anxiety, hepatitis C controlled with home meds. Pt declined smoking cessation information. Goal: Other Discharge Outcomes/Goals Outcome: Progressing Plan of care progress to goals: Pt a&o, calm and cooperative. BP/HR stable, nitoglycerin d/c per MD order. Pt refused lactulose. Denies pain. NPO since midnight for abd [place].

## 2014-08-12 NOTE — Discharge Instructions (Signed)
DIET:  Cardiac diet  DISCHARGE CONDITION:  Stable  ACTIVITY:  Activity as tolerated  OXYGEN:  Home Oxygen: No.   Oxygen Delivery: room air  DISCHARGE LOCATION:  home   If you experience worsening of your admission symptoms, develop shortness of breath, life threatening emergency, suicidal or homicidal thoughts you must seek medical attention immediately by calling 911 or calling your MD immediately  if symptoms less severe.  You Must read complete instructions/literature along with all the possible adverse reactions/side effects for all the Medicines you take and that have been prescribed to you. Take any new Medicines after you have completely understood and accpet all the possible adverse reactions/side effects.   Please note  You were cared for by a hospitalist during your hospital stay. If you have any questions about your discharge medications or the care you received while you were in the hospital after you are discharged, you can call the unit and asked to speak with the hospitalist on call if the hospitalist that took care of you is not available. Once you are discharged, your primary care physician will handle any further medical issues. Please note that NO REFILLS for any discharge medications will be authorized once you are discharged, as it is imperative that you return to your primary care physician (or establish a relationship with a primary care physician if you do not have one) for your aftercare needs so that they can reassess your need for medications and monitor your lab values.   Ammonia, Plasma Ammonia This is a test to detect elevated levels of ammonia in the blood, to evaluate changes in consciousness, or to help diagnose hepatic encephalopathy and Reye syndrome. It may be ordered when a patient experiences mental changes or lapses into a coma of unknown origin, if an infant or child experiences frequent vomiting and increased lethargy as a newborn, or about a week  after a viral illness. Ammonia is a compound produced by intestinal bacteria and by cells in the body during the digestion of protein. Ammonia is a waste product that the liver changes into urea and glutamine. The urea is then carried by the blood to the kidneys, where it is put out in the urine. If this "urea cycle" does not complete, ammonia builds up in the blood. This also happens when you cannot put out urine (kidney failure) or when your liver does not work (hepatic failure). A buildup of ammonia in the body can cause mental and neurological changes that can lead to confusion, disorientation, sleepiness, and eventually to coma and even death. Infants and children with increased ammonia levels may vomit frequently, be irritable, and be increasingly lethargic. Left untreated, they may experience seizures, respiratory difficulty, and may lapse into a coma and die. PREPARATION FOR TEST No preparation or fasting is necessary. A blood sample is taken by a needle from a vein.  Avoid exercising before this test. NORMAL FINDINGS  Normal values depend on the method used for testing. Test results depend on many factors including age, sex, etc. Your lab report should include the specific reference range for your test. Your caregiver will go over you test results with you.  Adults: 10-80 mcg/dL (6-47 micromole/L)  Neonates, 0 to 10 days (enzymatic): 170-341 mcg/dL (100-200 micromole/L)  Infants and toddlers, 10 days to 2 years (enzymatic): 68-136 mcg/dL (40-80 micromole/L)  Children, older than 2 years (enzymatic): 19-60 mcg/dL (11-35 micromole/L) Ranges for normal findings may vary among different laboratories and hospitals. You should always check with  your doctor after having lab work or other tests done to discuss the meaning of your test results and whether your values are considered within normal limits. MEANING OF TEST  Your caregiver will go over the test results with you and discuss the  importance and meaning of your results, as well as treatment options and the need for additional tests if necessary. OBTAINING THE TEST RESULTS  It is your responsibility to obtain your test results. Ask the lab or department performing the test when and how you will get your results. Document Released: 03/19/2004 Document Revised: 07/12/2013 Document Reviewed: 02/01/2008 [facility] Patient Information 2015 [place], [place]. This information is not intended to replace advice given to you by your health care provider. Make sure you discuss any questions you have with your health care provider.

## 2014-08-12 NOTE — Progress Notes (Signed)
[facility] [place] NAME: [name]    MR#:  [id number]  DATE OF BIRTH:  08/09/1955  SUBJECTIVE:  CHIEF COMPLAINT:   Chief Complaint  Patient presents with  . Altered Mental Status   - more alert and at baseline, refusing lactulose at nights.  -had an anxiety episode last night - for discharge today  REVIEW OF SYSTEMS:  Review of Systems  Constitutional: Negative for fever and chills.  Respiratory: Negative for cough, shortness of breath and wheezing.   Cardiovascular: Negative for chest pain and palpitations.  Gastrointestinal: Negative for nausea, vomiting, abdominal pain, diarrhea and constipation.  Genitourinary: Negative for dysuria.  Neurological: Negative for dizziness, seizures and headaches.    DRUG ALLERGIES:  No Known Allergies  VITALS:  Blood pressure 176/86, pulse 68, temperature 97.5 F (36.4 C), temperature source Oral, resp. rate 18, height 5\' 10"  (1.778 m), weight 65.817 kg (145 lb 1.6 oz), SpO2 95 %.  PHYSICAL EXAMINATION:  Physical Exam  GENERAL:  59 y.o.-year-old patient lying in the bed with no acute distress.  EYES: Pupils equal, round, reactive to light and accommodation. No scleral icterus. Extraocular muscles intact.  HEENT: Head atraumatic, normocephalic. Oropharynx and nasopharynx clear.  NECK:  Supple, no jugular venous distention. No thyroid enlargement, no tenderness.  LUNGS: Normal breath sounds bilaterally, no wheezing, rales,rhonchi or crepitation. No use of accessory muscles of respiration.  CARDIOVASCULAR: S1, S2 normal. No murmurs, rubs, or gallops.  ABDOMEN: Soft, nontender, nondistended. Bowel sounds present. No organomegaly or mass.  EXTREMITIES: No pedal edema, cyanosis, or clubbing.  NEUROLOGIC: Cranial nerves II through XII are intact. Muscle strength 5/5 in all extremities. Sensation intact. Gait not checked.  PSYCHIATRIC: The patient is alert and oriented x 3.  SKIN: No  obvious rash, lesion, or ulcer.    LABORATORY PANEL:   CBC  Recent Labs Lab 08/11/14 0538  WBC 7.9  HGB 14.5  HCT 42.2  PLT 110*   ------------------------------------------------------------------------------------------------------------------  Chemistries   Recent Labs Lab 08/11/14 0538  08/12/14 0618  NA 141  --   --   K 4.2  --   --   CL 110  --   --   CO2 25  --   --   GLUCOSE 104*  --   --   BUN 16  --   --   CREATININE 0.71  --   --   CALCIUM 9.6  --   --   AST  --   < > 352*  ALT  --   < > 339*  ALKPHOS  --   < > 95  BILITOT  --   < > 1.5*  < > = values in this interval not displayed. ------------------------------------------------------------------------------------------------------------------  Cardiac Enzymes No results for input(s): TROPONINI in the last 168 hours. ------------------------------------------------------------------------------------------------------------------  RADIOLOGY:  Dg Chest 2 View  08/10/2014   CLINICAL DATA:  Dizziness and weakness today.  EXAM: CHEST  2 VIEW  COMPARISON:  06/13/2014.  FINDINGS: The cardiac silhouette, mediastinal and hilar contours are within normal limits and stable. There is mild tortuosity and calcification of the thoracic aorta. The lungs are clear of acute process. No pleural effusion or pneumothorax. Artifact from EKG leads are noted. No definite pulmonary lesions. The bony thorax is intact. Stable mild compression deformity in the lower thoracic spine.  IMPRESSION: No acute cardiopulmonary findings.   Electronically Signed   By: Marijo Sanes M.D.   On: 08/10/2014 17:55  Ct Head Wo Contrast  08/10/2014   CLINICAL DATA:  Altered mental status.  Cirrhosis.  EXAM: CT HEAD WITHOUT CONTRAST  TECHNIQUE: Contiguous axial images were obtained from the base of the skull through the vertex without intravenous contrast.  COMPARISON:  122-Jun-202015 head CT.  FINDINGS: No mass lesion, mass effect, midline shift,  hydrocephalus, hemorrhage. No acute territorial cortical ischemia/infarct. Atrophy and chronic ischemic white matter disease is present. Mastoid air cells and paranasal sinuses are within normal limits. Rightward nasal septal deviation.  IMPRESSION: Mild atrophy and chronic ischemic white matter disease without acute intracranial abnormality.   Electronically Signed   By: Dereck Ligas M.D.   On: 08/10/2014 17:54   US Abdomen Complete  08/12/2014   CLINICAL DATA:  Hepatic cirrhosis  EXAM: ULTRASOUND ABDOMEN COMPLETE  COMPARISON:  Limited abdominal ultrasound for ascites dated August 10, 2014  FINDINGS: Gallbladder: The gallbladder is adequately distended. There is a 2 mm diameter at echogenic adherent focus which may reflect a small polyp. There is mild gallbladder wall thickening at 3 mm. There is no pericholecystic fluid. There is no positive sonographic Murphy's sign.  Common bile duct: Diameter: 5.3 mm  Liver: The hepatic echotexture is increased. The surface contour is quite irregular. No discrete mass is observed. There is no intrahepatic ductal dilation.  IVC: No abnormality visualized.  Pancreas: The pancreatic body is unremarkable. The pancreatic head and tail were partially obscured by bowel gas.  Spleen: The spleen is normal in size and echotexture.  Right Kidney: Length: 11.8 cm. There is a parapelvic cyst measuring 1.2 cm in greatest dimension. There is no hydronephrosis.  Left Kidney: Length: 11.6 cm. Echogenicity within normal limits. No mass or hydronephrosis visualized.  Abdominal aorta: No aneurysm visualized. Bowel gas limits evaluation however.  Other findings: None.  IMPRESSION: 1. Probable gallbladder polyp. Borderline gallbladder wall thickening. There is no positive sonographic Murphy's sign. 2. Cirrhotic changes of the liver. No hepatic mass is observed. There is no splenomegaly nor ascites. 3. No acute abnormality is observed elsewhere within the abdomen.   Electronically Signed   By:  David  Martinique M.D.   On: 08/12/2014 10:13   US Abdomen Limited  08/10/2014   CLINICAL DATA:  Hepatitis-C infection and alcoholism.  EXAM: LIMITED ABDOMEN ULTRASOUND FOR ASCITES  TECHNIQUE: Limited ultrasound survey for ascites was performed in all four abdominal quadrants.  COMPARISON:  None.  FINDINGS: Four quadrant survey shows no ascites in the peritoneal cavity. Solid organs were not evaluated.  IMPRESSION: No ascites identified in the peritoneal cavity.   Electronically Signed   By: Aletta Edouard M.D.   On: 08/10/2014 19:42    EKG:   Orders placed or performed during the hospital encounter of 08/10/14  . ED EKG  . ED EKG  . EKG 12-Lead  . EKG 12-Lead    ASSESSMENT AND PLAN:   58y/oM with PMH of COPD, Hep C, Liver cirrhosis, DM, HTN admitted for confusion and hepatic encephalopathy  * Hepatic encephalopathy- confusion is improving- at baseline - ammonia is improving, but elevated again this am as pt didn't take lactulose yesterday night. - cont lactulose bid and xifaxan - CT head is negative for ay acute changes  * Malignant HTN- elevated, but better today. On metoprolol and nitroglycerin patch. We'll continue for now. Add norvasc  * Elevated LFTs-3 times elevated than baseline. Patient denies any active alcohol use. But acknowledges that he might be using cocaine and marijuana. Urine tox screen was not done on admission. -  Ultrasound of the abdomen done-and no ascites noted.liver with cirrhotic changes - appreciate GI consult- hep panel, AFP ordered -Continue to monitor for now. Avoid hepatotoxins. - outpatient f/u recommended  * COPD-stable, cont home inh  * Chronic pain syndrome- on methadone TID- watch carefully with his confusion especially  * Guiac positive stools- hb slight drop, but at baseline No active bleeding, hb upto 13 Cont PO protonix  * DVT prophylaxis- TEDs, SCDs  * Anxiety- on xanax prn   All the records are reviewed and case discussed with Care  Management/Social Workerr. Management plans discussed with the patient, family and they are in agreement.  CODE STATUS: Full Code   TOTAL TIME TAKING CARE OF THIS PATIENT: 38 minutes.   POSSIBLE D/C TODAY, DEPENDING ON CLINICAL CONDITION.   Charle Mclaurin M.D on 08/12/2014 at 11:30 AM  Between 7am to 6pm - Pager - [phone]  After 6pm go to [email or web address] - password [email or web address] [facility]  Office  [phone]  CC: Primary care physician; Theotis Burrow, MD

## 2014-08-12 NOTE — Discharge Summary (Signed)
[facility] [place] NAME: Shadrach Bartunek    MR#:  [id number]  DATE OF BIRTH:  1955-12-25  DATE OF ADMISSION:  08/10/2014 ADMITTING PHYSICIAN: Theodoro Grist, MD  DATE OF DISCHARGE: 08/12/14  PRIMARY CARE PHYSICIAN: Theotis Burrow, MD    ADMISSION DIAGNOSIS:  Hepatic encephalopathy [K72.90] Ascites [R18.8]  DISCHARGE DIAGNOSIS:  Principal Problem:   Hepatic encephalopathy Active Problems:   HTN (hypertension)   Hepatitis C   Polysubstance abuse   SECONDARY DIAGNOSIS:   Past Medical History  Diagnosis Date  . Anxiety unk  . Chronic back pain unk  . Diabetes mellitus without complication   . COPD (chronic obstructive pulmonary disease)     HOSPITAL COURSE:   59y/oM with PMH of COPD, Hep C, Liver cirrhosis, DM, HTN admitted for confusion and hepatic encephalopathy  * Hepatic encephalopathy- confusion is improving- at baseline - ammonia is improving, but elevated again this am as pt didn't take lactulose yesterday night. - cont lactulose bid and xifaxan - CT head is negative for ay acute changes  * Malignant HTN- elevated, but better today. On metoprolol and norvasc.  * Elevated LFTs-3 times elevated than baseline. Patient denies any active alcohol use. But acknowledges that he might be using cocaine and marijuana. Urine tox screen was not done on admission. -Ultrasound of the abdomen done-and no ascites noted.liver with cirrhotic changes - appreciate GI consult- hep panel, AFP ordered -Continue to monitor for now. Avoid hepatotoxins. - outpatient f/u recommended  * COPD-stable, cont home inh  * Chronic pain syndrome- on methadone TID- watch carefully with his confusion especially  * Guiac positive stools- hb slight drop, but at baseline No active bleeding, hb upto 13 Cont PO protonix  * Anxiety- on xanax prn  DISCHARGE CONDITIONS:   Guarded  CONSULTS OBTAINED:  Treatment Team:  Theodoro Grist,  MD Lucilla Lame, MD  DRUG ALLERGIES:  No Known Allergies  DISCHARGE MEDICATIONS:   Current Discharge Medication List    START taking these medications   Details  amLODipine (NORVASC) 5 MG tablet Take 1 tablet (5 mg total) by mouth daily. Qty: 30 tablet, Refills: 1    lactulose (CHRONULAC) 10 GM/15ML solution Take 45 mLs (30 g total) by mouth 2 (two) times daily. Qty: 240 mL, Refills: 2    pantoprazole (PROTONIX) 40 MG tablet Take 1 tablet (40 mg total) by mouth daily. Qty: 30 tablet, Refills: 1    rifaximin (XIFAXAN) 550 MG TABS tablet Take 1 tablet (550 mg total) by mouth 2 (two) times daily. Qty: 60 tablet, Refills: 1      CONTINUE these medications which have CHANGED   Details  metoprolol (LOPRESSOR) 100 MG tablet Take 1 tablet (100 mg total) by mouth 2 (two) times daily. Qty: 60 tablet, Refills: 1      CONTINUE these medications which have NOT CHANGED   Details  ALPRAZolam (XANAX) 1 MG tablet Take 1 mg by mouth daily as needed for anxiety.    budesonide-formoterol (SYMBICORT) 160-4.5 MCG/ACT inhaler Inhale 2 puffs into the lungs 2 (two) times daily.    ipratropium-albuterol (DUONEB) 0.5-2.5 (3) MG/3ML SOLN Take 3 mLs by nebulization 4 (four) times daily as needed (for shortness of breath).    metFORMIN (GLUCOPHAGE) 500 MG tablet Take 500 mg by mouth 2 (two) times daily.    methadone (DOLOPHINE) 10 MG tablet Take 10-20 mg by mouth 3 (three) times daily.    Multiple Vitamins-Minerals (MULTIVITAMIN PO) Take 1 tablet by  mouth daily.    tiotropium (SPIRIVA) 18 MCG inhalation capsule Place 18 mcg into inhaler and inhale daily.         DISCHARGE INSTRUCTIONS:   1. PCP f/u in 1 week 2. GI f/u in 2 weeks 3. Psych f/u in 2 weeks- as prior scheduled  If you experience worsening of your admission symptoms, develop shortness of breath, life threatening emergency, suicidal or homicidal thoughts you must seek medical attention immediately by calling 911 or calling your MD  immediately  if symptoms less severe.  You Must read complete instructions/literature along with all the possible adverse reactions/side effects for all the Medicines you take and that have been prescribed to you. Take any new Medicines after you have completely understood and accept all the possible adverse reactions/side effects.   Please note  You were cared for by a hospitalist during your hospital stay. If you have any questions about your discharge medications or the care you received while you were in the hospital after you are discharged, you can call the unit and asked to speak with the hospitalist on call if the hospitalist that took care of you is not available. Once you are discharged, your primary care physician will handle any further medical issues. Please note that NO REFILLS for any discharge medications will be authorized once you are discharged, as it is imperative that you return to your primary care physician (or establish a relationship with a primary care physician if you do not have one) for your aftercare needs so that they can reassess your need for medications and monitor your lab values.    Today   CHIEF COMPLAINT:   Chief Complaint  Patient presents with  . Altered Mental Status    VITAL SIGNS:  Blood pressure 176/86, pulse 68, temperature 97.5 F (36.4 C), temperature source Oral, resp. rate 18, height 5\' 10"  (1.778 m), weight 65.817 kg (145 lb 1.6 oz), SpO2 95 %.  I/O:   Intake/Output Summary (Last 24 hours) at 08/12/14 1140 Last data filed at 08/11/14 1800  Gross per 24 hour  Intake    480 ml  Output      0 ml  Net    480 ml    PHYSICAL EXAMINATION:   Physical Exam  GENERAL: 59 y.o.-year-old patient lying in the bed with no acute distress.  EYES: Pupils equal, round, reactive to light and accommodation. No scleral icterus. Extraocular muscles intact.  HEENT: Head atraumatic, normocephalic. Oropharynx and nasopharynx clear.  NECK: Supple, no  jugular venous distention. No thyroid enlargement, no tenderness.  LUNGS: Normal breath sounds bilaterally, no wheezing, rales,rhonchi or crepitation. No use of accessory muscles of respiration.  CARDIOVASCULAR: S1, S2 normal. No murmurs, rubs, or gallops.  ABDOMEN: Soft, nontender, nondistended. Bowel sounds present. No organomegaly or mass.  EXTREMITIES: No pedal edema, cyanosis, or clubbing.  NEUROLOGIC: Cranial nerves II through XII are intact. Muscle strength 5/5 in all extremities. Sensation intact. Gait not checked.  PSYCHIATRIC: The patient is alert and oriented x 3.  SKIN: No obvious rash, lesion, or ulcer.   DATA REVIEW:   CBC  Recent Labs Lab 08/11/14 0538  WBC 7.9  HGB 14.5  HCT 42.2  PLT 110*    Chemistries   Recent Labs Lab 08/11/14 0538  08/12/14 0618  NA 141  --   --   K 4.2  --   --   CL 110  --   --   CO2 25  --   --  GLUCOSE 104*  --   --   BUN 16  --   --   CREATININE 0.71  --   --   CALCIUM 9.6  --   --   AST  --   < > 352*  ALT  --   < > 339*  ALKPHOS  --   < > 95  BILITOT  --   < > 1.5*  < > = values in this interval not displayed.  Cardiac Enzymes No results for input(s): TROPONINI in the last 168 hours.  Microbiology Results  Results for orders placed or performed during the hospital encounter of 06/13/14  Culture, blood (single)     Status: None   Collection Time: 06/13/14  2:59 AM  Result Value Ref Range Status   Micro Text Report   Final       COMMENT                   NO GROWTH AEROBICALLY/ANAEROBICALLY IN 5 DAYS   ANTIBIOTIC                                                      Culture, blood (single)     Status: None   Collection Time: 06/13/14  5:20 AM  Result Value Ref Range Status   Micro Text Report   Final       COMMENT                   NO GROWTH AEROBICALLY/ANAEROBICALLY IN 5 DAYS   ANTIBIOTIC                                                      Culture, expectorated sputum-assessment     Status: None    Collection Time: 06/13/14 11:27 AM  Result Value Ref Range Status   Micro Text Report   Final       COMMENT                   CONSISTENT WITH NORMAL FLORA   GRAM STAIN                FAIR SPECIMEN-70-80% WBC   GRAM STAIN                FEW WHITE BLOOD CELLS   GRAM STAIN                FEW GRAM POSITIVE COCCI IN PAIRS IN CHAINS   GRAM STAIN                RARE GRAM VARIABLE ROD   ANTIBIOTIC                                                        RADIOLOGY:  Dg Chest 2 View  08/10/2014   CLINICAL DATA:  Dizziness and weakness today.  EXAM: CHEST  2 VIEW  COMPARISON:  06/13/2014.  FINDINGS: The cardiac silhouette, mediastinal and hilar contours are within normal limits  and stable. There is mild tortuosity and calcification of the thoracic aorta. The lungs are clear of acute process. No pleural effusion or pneumothorax. Artifact from EKG leads are noted. No definite pulmonary lesions. The bony thorax is intact. Stable mild compression deformity in the lower thoracic spine.  IMPRESSION: No acute cardiopulmonary findings.   Electronically Signed   By: Marijo Sanes M.D.   On: 08/10/2014 17:55   Ct Head Wo Contrast  08/10/2014   CLINICAL DATA:  Altered mental status.  Cirrhosis.  EXAM: CT HEAD WITHOUT CONTRAST  TECHNIQUE: Contiguous axial images were obtained from the base of the skull through the vertex without intravenous contrast.  COMPARISON:  1Aug 21, 202015 head CT.  FINDINGS: No mass lesion, mass effect, midline shift, hydrocephalus, hemorrhage. No acute territorial cortical ischemia/infarct. Atrophy and chronic ischemic white matter disease is present. Mastoid air cells and paranasal sinuses are within normal limits. Rightward nasal septal deviation.  IMPRESSION: Mild atrophy and chronic ischemic white matter disease without acute intracranial abnormality.   Electronically Signed   By: Dereck Ligas M.D.   On: 08/10/2014 17:54   US Abdomen Complete  08/12/2014   CLINICAL DATA:  Hepatic cirrhosis   EXAM: ULTRASOUND ABDOMEN COMPLETE  COMPARISON:  Limited abdominal ultrasound for ascites dated August 10, 2014  FINDINGS: Gallbladder: The gallbladder is adequately distended. There is a 2 mm diameter at echogenic adherent focus which may reflect a small polyp. There is mild gallbladder wall thickening at 3 mm. There is no pericholecystic fluid. There is no positive sonographic Murphy's sign.  Common bile duct: Diameter: 5.3 mm  Liver: The hepatic echotexture is increased. The surface contour is quite irregular. No discrete mass is observed. There is no intrahepatic ductal dilation.  IVC: No abnormality visualized.  Pancreas: The pancreatic body is unremarkable. The pancreatic head and tail were partially obscured by bowel gas.  Spleen: The spleen is normal in size and echotexture.  Right Kidney: Length: 11.8 cm. There is a parapelvic cyst measuring 1.2 cm in greatest dimension. There is no hydronephrosis.  Left Kidney: Length: 11.6 cm. Echogenicity within normal limits. No mass or hydronephrosis visualized.  Abdominal aorta: No aneurysm visualized. Bowel gas limits evaluation however.  Other findings: None.  IMPRESSION: 1. Probable gallbladder polyp. Borderline gallbladder wall thickening. There is no positive sonographic Murphy's sign. 2. Cirrhotic changes of the liver. No hepatic mass is observed. There is no splenomegaly nor ascites. 3. No acute abnormality is observed elsewhere within the abdomen.   Electronically Signed   By: David  Martinique M.D.   On: 08/12/2014 10:13   US Abdomen Limited  08/10/2014   CLINICAL DATA:  Hepatitis-C infection and alcoholism.  EXAM: LIMITED ABDOMEN ULTRASOUND FOR ASCITES  TECHNIQUE: Limited ultrasound survey for ascites was performed in all four abdominal quadrants.  COMPARISON:  None.  FINDINGS: Four quadrant survey shows no ascites in the peritoneal cavity. Solid organs were not evaluated.  IMPRESSION: No ascites identified in the peritoneal cavity.   Electronically Signed   By:  Aletta Edouard M.D.   On: 08/10/2014 19:42    EKG:   Orders placed or performed during the hospital encounter of 08/10/14  . ED EKG  . ED EKG  . EKG 12-Lead  . EKG 12-Lead      Management plans discussed with the patient, family and they are in agreement.  CODE STATUS:     Code Status Orders        Start     Ordered   08/10/14 2023  Full code   Continuous     08/10/14 2022      TOTAL TIME TAKING CARE OF THIS PATIENT: 40 minutes.    Shadrack Brummitt M.D on 08/12/2014 at 11:40 AM  Between 7am to 6pm - Pager - [phone]  After 6pm go to [email or web address] - password [email or web address] [facility]  Office  [phone]  CC: Primary care physician; Theotis Burrow, MD

## 2014-08-12 NOTE — Progress Notes (Signed)
Notified MD that pt requested methadone and xanax now or he will leave the hospital. Pt currently is anxious, and verbalized that he takes meds at home at this time. Pt is NPO for an [place]. MD verbalized to give pt xanax and methadone now.

## 2014-08-14 ENCOUNTER — Telehealth: Payer: Self-pay | Admitting: Urgent Care

## 2014-08-14 LAB — HEPATITIS B SURFACE ANTIBODY, QUANTITATIVE

## 2014-08-14 LAB — HEPATITIS C GENOTYPE

## 2014-08-14 LAB — HEPATITIS A ANTIBODY, TOTAL: Hep A Total Ab: POSITIVE — AB

## 2014-08-14 LAB — HEPATITIS PANEL, ACUTE
HCV Ab: >11 s/co ratio — ABNORMAL HIGH (ref 0.0–0.9)
HEP B C IGM: NEGATIVE — AB
Hep A IgM: NEGATIVE — AB
Hepatitis B Surface Ag: NEGATIVE — AB

## 2014-08-14 LAB — AFP TUMOR MARKER: AFP-Tumor Marker: 79.6 ng/mL — ABNORMAL HIGH (ref 0.0–8.3)

## 2014-08-14 NOTE — Telephone Encounter (Signed)
Patient needs appt for cirrhosis in 1-2 weeks with me Thanks

## 2014-08-15 ENCOUNTER — Telehealth: Payer: Self-pay | Admitting: Urgent Care

## 2014-08-15 LAB — HCV RNA QUANT
HCV Quantitative Log: 4.739 log10 IU/mL (ref >1.70)
HCV Quantitative: 54820 IU/mL (ref >50)

## 2014-08-15 NOTE — Telephone Encounter (Signed)
Discussed elevated AFP with patient.  He refuses to schedule MRI liver at this time.  Says he wants to wait until after office visit.  He has appt with me later this month.

## 2014-08-23 ENCOUNTER — Ambulatory Visit: Payer: Medicaid Other | Admitting: Urgent Care

## 2014-08-24 DIAGNOSIS — K7291 Hepatic failure, unspecified with coma: Secondary | ICD-10-CM

## 2014-11-06 ENCOUNTER — Emergency Department: Payer: Medicaid Other

## 2014-11-06 ENCOUNTER — Encounter: Payer: Self-pay | Admitting: Internal Medicine

## 2014-11-06 ENCOUNTER — Inpatient Hospital Stay: Payer: Medicaid Other

## 2014-11-06 ENCOUNTER — Inpatient Hospital Stay
Admission: EM | Admit: 2014-11-06 | Discharge: 2014-11-07 | DRG: 092 | Disposition: A | Discharge status: Home / Self Care | Payer: Medicaid Other | Attending: Internal Medicine | Admitting: Internal Medicine

## 2014-11-06 DIAGNOSIS — I10 Essential (primary) hypertension: Secondary | ICD-10-CM | POA: Diagnosis present

## 2014-11-06 DIAGNOSIS — R4182 Altered mental status, unspecified: Secondary | ICD-10-CM

## 2014-11-06 DIAGNOSIS — Z79891 Long term (current) use of opiate analgesic: Secondary | ICD-10-CM

## 2014-11-06 DIAGNOSIS — M549 Dorsalgia, unspecified: Secondary | ICD-10-CM | POA: Diagnosis present

## 2014-11-06 DIAGNOSIS — K7290 Hepatic failure, unspecified without coma: Secondary | ICD-10-CM | POA: Diagnosis present

## 2014-11-06 DIAGNOSIS — B1920 Unspecified viral hepatitis C without hepatic coma: Secondary | ICD-10-CM | POA: Diagnosis present

## 2014-11-06 DIAGNOSIS — F1410 Cocaine abuse, uncomplicated: Secondary | ICD-10-CM | POA: Diagnosis present

## 2014-11-06 DIAGNOSIS — E119 Type 2 diabetes mellitus without complications: Secondary | ICD-10-CM | POA: Diagnosis present

## 2014-11-06 DIAGNOSIS — R188 Other ascites: Secondary | ICD-10-CM | POA: Diagnosis present

## 2014-11-06 DIAGNOSIS — M1990 Unspecified osteoarthritis, unspecified site: Secondary | ICD-10-CM | POA: Diagnosis present

## 2014-11-06 DIAGNOSIS — G92 Toxic encephalopathy: Principal | ICD-10-CM | POA: Diagnosis present

## 2014-11-06 DIAGNOSIS — F1310 Sedative, hypnotic or anxiolytic abuse, uncomplicated: Secondary | ICD-10-CM | POA: Diagnosis present

## 2014-11-06 DIAGNOSIS — R7989 Other specified abnormal findings of blood chemistry: Secondary | ICD-10-CM | POA: Diagnosis present

## 2014-11-06 DIAGNOSIS — B182 Chronic viral hepatitis C: Secondary | ICD-10-CM | POA: Diagnosis present

## 2014-11-06 DIAGNOSIS — Z79899 Other long term (current) drug therapy: Secondary | ICD-10-CM | POA: Diagnosis not present

## 2014-11-06 DIAGNOSIS — F32A Depression, unspecified: Secondary | ICD-10-CM

## 2014-11-06 DIAGNOSIS — G8929 Other chronic pain: Secondary | ICD-10-CM | POA: Diagnosis present

## 2014-11-06 DIAGNOSIS — J449 Chronic obstructive pulmonary disease, unspecified: Secondary | ICD-10-CM | POA: Diagnosis present

## 2014-11-06 DIAGNOSIS — F17210 Nicotine dependence, cigarettes, uncomplicated: Secondary | ICD-10-CM | POA: Diagnosis present

## 2014-11-06 DIAGNOSIS — R339 Retention of urine, unspecified: Secondary | ICD-10-CM | POA: Diagnosis present

## 2014-11-06 DIAGNOSIS — F419 Anxiety disorder, unspecified: Secondary | ICD-10-CM | POA: Diagnosis present

## 2014-11-06 DIAGNOSIS — W06XXXA Fall from bed, initial encounter: Secondary | ICD-10-CM | POA: Diagnosis present

## 2014-11-06 DIAGNOSIS — K7460 Unspecified cirrhosis of liver: Secondary | ICD-10-CM | POA: Diagnosis present

## 2014-11-06 DIAGNOSIS — F329 Major depressive disorder, single episode, unspecified: Secondary | ICD-10-CM | POA: Diagnosis present

## 2014-11-06 DIAGNOSIS — G9340 Encephalopathy, unspecified: Secondary | ICD-10-CM | POA: Diagnosis present

## 2014-11-06 DIAGNOSIS — Y92019 Unspecified place in single-family (private) house as the place of occurrence of the external cause: Secondary | ICD-10-CM

## 2014-11-06 HISTORY — DX: Major depressive disorder, single episode, unspecified: F32.9

## 2014-11-06 HISTORY — DX: Unspecified osteoarthritis, unspecified site: M19.90

## 2014-11-06 HISTORY — DX: Chronic viral hepatitis C: B18.2

## 2014-11-06 HISTORY — DX: Essential (primary) hypertension: I10

## 2014-11-06 HISTORY — DX: Depression, unspecified: F32.A

## 2014-11-06 LAB — TROPONIN I
TROPONIN I: 0.03 ng/mL (ref <0.031)
TROPONIN I: 0.03 ng/mL (ref <0.031)
TROPONIN I: 0.03 ng/mL (ref <0.031)
Troponin I: 0.04 ng/mL — ABNORMAL HIGH (ref <0.031)

## 2014-11-06 LAB — URINE DRUG SCREEN, QUALITATIVE (ARMC ONLY)
Amphetamines, Ur Screen: NOT DETECTED — AB
BARBITURATES, UR SCREEN: NOT DETECTED — AB
BENZODIAZEPINE, UR SCRN: POSITIVE — AB
CANNABINOID 50 NG, UR ~~LOC~~: NOT DETECTED — AB
COCAINE METABOLITE, UR ~~LOC~~: POSITIVE — AB
MDMA (Ecstasy)Ur Screen: NOT DETECTED — AB
METHADONE SCREEN, URINE: POSITIVE — AB
Opiate, Ur Screen: NOT DETECTED — AB
Phencyclidine (PCP) Ur S: POSITIVE — AB
TRICYCLIC, UR SCREEN: NOT DETECTED — AB

## 2014-11-06 LAB — LACTIC ACID, PLASMA
LACTIC ACID, VENOUS: 2.6 mmol/L — AB (ref 0.5–2.0)
Lactic Acid, Venous: 1.1 mmol/L (ref 0.5–2.0)

## 2014-11-06 LAB — COMPREHENSIVE METABOLIC PANEL
ALBUMIN: 3.9 g/dL (ref 3.5–5.0)
ALT: 221 U/L — ABNORMAL HIGH (ref 17–63)
ANION GAP: 9 (ref 5–15)
AST: 299 U/L — AB (ref 15–41)
Alkaline Phosphatase: 94 U/L (ref 38–126)
BUN: 23 mg/dL — AB (ref 6–20)
CHLORIDE: 102 mmol/L (ref 101–111)
CO2: 27 mmol/L (ref 22–32)
Calcium: 9.3 mg/dL (ref 8.9–10.3)
Creatinine, Ser: 1.17 mg/dL (ref 0.61–1.24)
GFR calc Af Amer: >60 mL/min (ref >60)
GLUCOSE: 144 mg/dL — AB (ref 65–99)
POTASSIUM: 3.8 mmol/L (ref 3.5–5.1)
Sodium: 138 mmol/L (ref 135–145)
TOTAL PROTEIN: 7.7 g/dL (ref 6.5–8.1)
Total Bilirubin: 2.5 mg/dL — ABNORMAL HIGH (ref 0.3–1.2)

## 2014-11-06 LAB — GLUCOSE, CAPILLARY
GLUCOSE-CAPILLARY: 71 mg/dL (ref 65–99)
GLUCOSE-CAPILLARY: 92 mg/dL (ref 65–99)
Glucose-Capillary: 102 mg/dL — ABNORMAL HIGH (ref 65–99)
Glucose-Capillary: 99 mg/dL (ref 65–99)
Glucose-Capillary: 69 mg/dL (ref 65–99)

## 2014-11-06 LAB — URINALYSIS COMPLETE WITH MICROSCOPIC (ARMC ONLY)
BACTERIA UA: NONE SEEN
BILIRUBIN URINE: NEGATIVE
GLUCOSE, UA: NEGATIVE mg/dL
HGB URINE DIPSTICK: NEGATIVE
KETONES UR: NEGATIVE mg/dL
Leukocytes, UA: NEGATIVE
NITRITE: NEGATIVE
Protein, ur: NEGATIVE mg/dL
Specific Gravity, Urine: 1.024 (ref 1.005–1.030)
Squamous Epithelial / LPF: NONE SEEN
pH: 5 (ref 5.0–8.0)

## 2014-11-06 LAB — CBC WITH DIFFERENTIAL/PLATELET
BASOS PCT: 1 %
Basophils Absolute: 0.1 10*3/uL (ref 0–0.1)
EOS ABS: 0.2 10*3/uL (ref 0–0.7)
EOS PCT: 2 %
HCT: 40.8 % (ref 40.0–52.0)
Hemoglobin: 14 g/dL (ref 13.0–18.0)
LYMPHS ABS: 1.9 10*3/uL (ref 1.0–3.6)
Lymphocytes Relative: 19 %
MCH: 33.1 pg (ref 26.0–34.0)
MCHC: 34.2 g/dL (ref 32.0–36.0)
MCV: 96.8 fL (ref 80.0–100.0)
MONO ABS: 1.2 10*3/uL — AB (ref 0.2–1.0)
MONOS PCT: 12 %
Neutro Abs: 6.4 10*3/uL (ref 1.4–6.5)
Neutrophils Relative %: 66 %
PLATELETS: 131 10*3/uL — AB (ref 150–440)
RBC: 4.21 MIL/uL — ABNORMAL LOW (ref 4.40–5.90)
RDW: 12.8 % (ref 11.5–14.5)
WBC: 9.7 10*3/uL (ref 3.8–10.6)

## 2014-11-06 LAB — ETHANOL: Alcohol, Ethyl (B): <5 mg/dL (ref <5)

## 2014-11-06 LAB — SALICYLATE LEVEL

## 2014-11-06 LAB — ACETAMINOPHEN LEVEL

## 2014-11-06 LAB — AMMONIA: Ammonia: 55 umol/L — ABNORMAL HIGH (ref 9–35)

## 2014-11-06 MED ORDER — LACTULOSE 10 GM/15ML PO SOLN
30.0000 g | Freq: Two times a day (BID) | ORAL | Status: DC
  Administered 2014-11-06 (×2): 30 g via ORAL
  Filled 2014-11-06 (×2): qty 60

## 2014-11-06 MED ORDER — RIFAXIMIN 550 MG PO TABS
550.0000 mg | ORAL_TABLET | Freq: Two times a day (BID) | ORAL | Status: DC
  Administered 2014-11-06 – 2014-11-07 (×3): 550 mg via ORAL
  Filled 2014-11-06 (×3): qty 1

## 2014-11-06 MED ORDER — SPIRONOLACTONE 25 MG PO TABS
25.0000 mg | ORAL_TABLET | Freq: Every day | ORAL | Status: DC
  Administered 2014-11-06: 25 mg via ORAL
  Filled 2014-11-06: qty 1

## 2014-11-06 MED ORDER — AMLODIPINE BESYLATE 5 MG PO TABS
5.0000 mg | ORAL_TABLET | Freq: Every day | ORAL | Status: DC
  Administered 2014-11-06: 5 mg via ORAL
  Filled 2014-11-06: qty 1

## 2014-11-06 MED ORDER — BUDESONIDE-FORMOTEROL FUMARATE 160-4.5 MCG/ACT IN AERO
2.0000 | INHALATION_SPRAY | Freq: Two times a day (BID) | RESPIRATORY_TRACT | Status: DC
  Administered 2014-11-06 – 2014-11-07 (×2): 2 via RESPIRATORY_TRACT
  Filled 2014-11-06 (×2): qty 6

## 2014-11-06 MED ORDER — ASPIRIN EC 81 MG PO TBEC
81.0000 mg | DELAYED_RELEASE_TABLET | Freq: Every day | ORAL | Status: DC
  Administered 2014-11-06 – 2014-11-07 (×2): 81 mg via ORAL
  Filled 2014-11-06 (×2): qty 1

## 2014-11-06 MED ORDER — SODIUM CHLORIDE 0.9 % IV BOLUS (SEPSIS)
1000.0000 mL | Freq: Once | INTRAVENOUS | Status: AC
  Administered 2014-11-06: 1000 mL via INTRAVENOUS

## 2014-11-06 MED ORDER — ENOXAPARIN SODIUM 40 MG/0.4ML ~~LOC~~ SOLN
40.0000 mg | SUBCUTANEOUS | Status: DC
  Administered 2014-11-06 – 2014-11-07 (×2): 40 mg via SUBCUTANEOUS
  Filled 2014-11-06 (×2): qty 0.4

## 2014-11-06 MED ORDER — METFORMIN HCL 500 MG PO TABS
500.0000 mg | ORAL_TABLET | Freq: Two times a day (BID) | ORAL | Status: DC
  Administered 2014-11-06 (×2): 500 mg via ORAL
  Filled 2014-11-06 (×2): qty 1

## 2014-11-06 MED ORDER — METHADONE HCL 10 MG/ML PO CONC: 20.0000 mg | Freq: Three times a day (TID) | ORAL | Status: DC

## 2014-11-06 MED ORDER — METHADONE HCL 10 MG PO TABS
20.0000 mg | ORAL_TABLET | Freq: Three times a day (TID) | ORAL | Status: DC
  Administered 2014-11-06 – 2014-11-07 (×4): 20 mg via ORAL
  Filled 2014-11-06 (×4): qty 2

## 2014-11-06 MED ORDER — METOPROLOL TARTRATE 100 MG PO TABS
100.0000 mg | ORAL_TABLET | Freq: Two times a day (BID) | ORAL | Status: DC
  Administered 2014-11-06: 100 mg via ORAL
  Filled 2014-11-06: qty 1

## 2014-11-06 MED ORDER — ONDANSETRON HCL 4 MG PO TABS: 4.0000 mg | ORAL_TABLET | Freq: Four times a day (QID) | ORAL | Status: DC | PRN

## 2014-11-06 MED ORDER — INSULIN ASPART 100 UNIT/ML ~~LOC~~ SOLN: 0.0000 [IU] | Freq: Three times a day (TID) | SUBCUTANEOUS | Status: DC

## 2014-11-06 MED ORDER — SODIUM CHLORIDE 0.9 % IJ SOLN
3.0000 mL | Freq: Two times a day (BID) | INTRAMUSCULAR | Status: DC
  Administered 2014-11-06 – 2014-11-07 (×2): 3 mL via INTRAVENOUS

## 2014-11-06 MED ORDER — MULTIVITAMIN PO LIQD: Freq: Every day | ORAL | Status: DC

## 2014-11-06 MED ORDER — FUROSEMIDE 40 MG PO TABS
40.0000 mg | ORAL_TABLET | Freq: Every day | ORAL | Status: DC
  Administered 2014-11-06: 40 mg via ORAL
  Filled 2014-11-06: qty 1

## 2014-11-06 MED ORDER — TIOTROPIUM BROMIDE MONOHYDRATE 18 MCG IN CAPS
18.0000 ug | ORAL_CAPSULE | Freq: Every day | RESPIRATORY_TRACT | Status: DC
  Administered 2014-11-06 – 2014-11-07 (×2): 18 ug via RESPIRATORY_TRACT
  Filled 2014-11-06: qty 5

## 2014-11-06 MED ORDER — PANTOPRAZOLE SODIUM 40 MG PO TBEC
40.0000 mg | DELAYED_RELEASE_TABLET | Freq: Every day | ORAL | Status: DC
  Administered 2014-11-06 – 2014-11-07 (×2): 40 mg via ORAL
  Filled 2014-11-06 (×2): qty 1

## 2014-11-06 MED ORDER — ADULT MULTIVITAMIN W/MINERALS CH
1.0000 | ORAL_TABLET | Freq: Every day | ORAL | Status: DC
  Administered 2014-11-06 – 2014-11-07 (×2): 1 via ORAL
  Filled 2014-11-06 (×2): qty 1

## 2014-11-06 MED ORDER — SODIUM CHLORIDE 0.9 % IV SOLN
INTRAVENOUS | Status: DC
  Administered 2014-11-06: 08:00:00 via INTRAVENOUS

## 2014-11-06 MED ORDER — ONDANSETRON HCL 4 MG/2ML IJ SOLN: 4.0000 mg | Freq: Four times a day (QID) | INTRAMUSCULAR | Status: DC | PRN

## 2014-11-06 MED ORDER — IPRATROPIUM-ALBUTEROL 0.5-2.5 (3) MG/3ML IN SOLN: 3.0000 mL | Freq: Four times a day (QID) | RESPIRATORY_TRACT | Status: DC | PRN

## 2014-11-06 MED ORDER — ALPRAZOLAM 0.5 MG PO TABS
0.5000 mg | ORAL_TABLET | Freq: Three times a day (TID) | ORAL | Status: DC | PRN
  Administered 2014-11-06 – 2014-11-07 (×3): 0.5 mg via ORAL
  Filled 2014-11-06 (×3): qty 1

## 2014-11-06 NOTE — Progress Notes (Signed)
FSBS 68/71 . HAS BEEN NPO FOR ULTRASOUND. PT BACK FROM  ULTRASOUND. MD ORDERS NO TX EXCEPT FEED PT AND GIVE JUICE.

## 2014-11-06 NOTE — ED Notes (Signed)
Informed Dr. Cinda Quest pt. Has lactic acid of 2.6

## 2014-11-06 NOTE — ED Notes (Signed)
Per EMS and family of pt. Pt. [place] out of bed.  Pt. Found off bed in corner of room.   Pt. Does not recall how he came off bed.  Pt. Presents to ed with altered mental status.

## 2014-11-06 NOTE — Progress Notes (Signed)
[facility] [place] NAME: [name]    MR#:  [id number]  DATE OF BIRTH:  01/04/1956  SUBJECTIVE:  CHIEF COMPLAINT:   Chief Complaint  Patient presents with  . Fall    Pt. here via EMS from home for fall from bed.  Presents to ed with alterned mental status.  Admitted for AMS, now more alert and denies any complaints. Urine tox positive for cocaine, PCP,benzo's and barbiturates Says he didn't sleep for several days and wants to sleep  REVIEW OF SYSTEMS:  Review of Systems  Constitutional: Negative for fever and chills.  HENT: Negative for hearing loss and tinnitus.   Respiratory: Negative for cough, shortness of breath and wheezing.   Cardiovascular: Negative for chest pain and palpitations.  Gastrointestinal: Negative for heartburn, nausea, vomiting, abdominal pain, diarrhea and constipation.  Genitourinary: Negative for dysuria, urgency and frequency.  Neurological: Positive for weakness. Negative for dizziness, sensory change, speech change, seizures and headaches.    DRUG ALLERGIES:  No Known Allergies  VITALS:  Blood pressure 114/55, pulse 73, temperature 97.8 F (36.6 C), temperature source Oral, resp. rate 18, height 5\' 11"  (1.803 m), weight 65 kg (143 lb 4.8 oz), SpO2 97 %.  PHYSICAL EXAMINATION:  Physical Exam  GENERAL:  59 y.o.-year-old patient lying in the bed with no acute distress.  EYES: Pupils equal, round, reactive to light and accommodation. No scleral icterus. Extraocular muscles intact.  HEENT: Head atraumatic, normocephalic. Oropharynx and nasopharynx clear.  NECK:  Supple, no jugular venous distention. No thyroid enlargement, no tenderness.  LUNGS: Normal breath sounds bilaterally, no wheezing, rales,rhonchi or crepitation. No use of accessory muscles of respiration.  CARDIOVASCULAR: S1, S2 normal. No rubs, or gallops. Systolic murmur heard. ABDOMEN: Soft, nontender, nondistended. Bowel sounds  present. No organomegaly or mass.  EXTREMITIES: No pedal edema, cyanosis, or clubbing.  NEUROLOGIC: Cranial nerves II through XII are intact. Muscle strength 5/5 in all extremities. Sensation intact. Gait not checked. Tremors of hands noted. PSYCHIATRIC: The patient is alert and oriented x 3. But sleepy. SKIN: No obvious rash, lesion, or ulcer.    LABORATORY PANEL:   CBC  Recent Labs Lab 11/06/14 0124  WBC 9.7  HGB 14.0  HCT 40.8  PLT 131*   ------------------------------------------------------------------------------------------------------------------  Chemistries   Recent Labs Lab 11/06/14 0124  NA 138  K 3.8  CL 102  CO2 27  GLUCOSE 144*  BUN 23*  CREATININE 1.17  CALCIUM 9.3  AST 299*  ALT 221*  ALKPHOS 94  BILITOT 2.5*   ------------------------------------------------------------------------------------------------------------------  Cardiac Enzymes  Recent Labs Lab 11/06/14 1139  TROPONINI 0.03   ------------------------------------------------------------------------------------------------------------------  RADIOLOGY:  Ct Head Wo Contrast  11/06/2014   CLINICAL DATA:  Patient fell out of bed. Altered mental status. History of seizures.  EXAM: CT HEAD WITHOUT CONTRAST  CT CERVICAL SPINE WITHOUT CONTRAST  TECHNIQUE: Multidetector CT imaging of the head and cervical spine was performed following the standard protocol without intravenous contrast. Multiplanar CT image reconstructions of the cervical spine were also generated.  COMPARISON:  CT head 08/10/2014  FINDINGS: CT HEAD FINDINGS  Ventricles and sulci appear symmetrical. No mass effect or midline shift. No abnormal extra-axial fluid collections. Gray-white matter junctions are distinct. Basal cisterns are not effaced. No evidence of acute intracranial hemorrhage. No depressed skull fractures. Visualized paranasal sinuses and mastoid air cells are not opacified. Vascular calcifications.  CT CERVICAL  SPINE FINDINGS  Straightening of the usual cervical lordosis. This may  be due to patient positioning but ligamentous injury or muscle spasm can also have this appearance and is not excluded. No anterior subluxation. Normal alignment of the facet joints. No vertebral compression deformities. Degenerative changes at C5-6 and C6-7 levels. No prevertebral soft tissue swelling. C1-2 articulation appears intact. No focal bone lesion or bone destruction. Bone cortex and trabecular architecture appear intact. Vascular calcifications in the cervical carotid arteries. Emphysematous changes in the lung apices.  IMPRESSION: No acute intracranial abnormalities.  Nonspecific straightening of the usual cervical lordosis. Mild degenerative changes in the cervical spine. No acute displaced fractures identified.   Electronically Signed   By: Lucienne Capers M.D.   On: 11/06/2014 01:22   Ct Cervical Spine Wo Contrast  11/06/2014   CLINICAL DATA:  Patient fell out of bed. Altered mental status. History of seizures.  EXAM: CT HEAD WITHOUT CONTRAST  CT CERVICAL SPINE WITHOUT CONTRAST  TECHNIQUE: Multidetector CT imaging of the head and cervical spine was performed following the standard protocol without intravenous contrast. Multiplanar CT image reconstructions of the cervical spine were also generated.  COMPARISON:  CT head 08/10/2014  FINDINGS: CT HEAD FINDINGS  Ventricles and sulci appear symmetrical. No mass effect or midline shift. No abnormal extra-axial fluid collections. Gray-white matter junctions are distinct. Basal cisterns are not effaced. No evidence of acute intracranial hemorrhage. No depressed skull fractures. Visualized paranasal sinuses and mastoid air cells are not opacified. Vascular calcifications.  CT CERVICAL SPINE FINDINGS  Straightening of the usual cervical lordosis. This may be due to patient positioning but ligamentous injury or muscle spasm can also have this appearance and is not excluded. No anterior  subluxation. Normal alignment of the facet joints. No vertebral compression deformities. Degenerative changes at C5-6 and C6-7 levels. No prevertebral soft tissue swelling. C1-2 articulation appears intact. No focal bone lesion or bone destruction. Bone cortex and trabecular architecture appear intact. Vascular calcifications in the cervical carotid arteries. Emphysematous changes in the lung apices.  IMPRESSION: No acute intracranial abnormalities.  Nonspecific straightening of the usual cervical lordosis. Mild degenerative changes in the cervical spine. No acute displaced fractures identified.   Electronically Signed   By: Lucienne Capers M.D.   On: 11/06/2014 01:22   Dg Chest Portable 1 View  11/06/2014   CLINICAL DATA:  Altered mental status, fell out of bed.  EXAM: PORTABLE CHEST - 1 VIEW  COMPARISON:  Frontal and lateral views 08/10/2014  FINDINGS: The lungs remain hyperinflated with diffuse emphysema. The cardiomediastinal contours are normal, there is atherosclerosis of the thoracic aorta. Pulmonary vasculature is normal. No consolidation, pleural effusion, or pneumothorax. No acute osseous abnormalities are seen.  IMPRESSION: Emphysema without acute process.   Electronically Signed   By: Jeb Levering M.D.   On: 11/06/2014 01:46    EKG:   Orders placed or performed during the hospital encounter of 11/06/14  . ED EKG  . ED EKG  . EKG 12-Lead  . EKG 12-Lead    ASSESSMENT AND PLAN:   59 year old male with past medical history significant for liver cirrhosis, hepatitis C, polysubstance abuse and hepatic encephalopathy presents to the hospital secondary to altered mental status  #1 unresponsiveness/altered mental status-toxic encephalopathy -Polysubstance abuse likely. Urine tox screen positive for benzos, for Barbiturates, cocaine and PCP - CT head negative for any changes. Ammonia is elevated at 55, could be his baseline with his liver cirrhosis. -Discontinue fluids with his ascites  and cirrhosis. Continue to monitor for next 24 hours.  -patient is already alert  and oriented at this time. -No evidence of any infection.  #2 liver cirrhosis with ascites-LFTs are elevated but stable. -Check liver ultrasound. -Ammonia is 55. Continue xifaxan and lactulose.  #3 polysubstance abuse-monitor or any withdrawals. -Strongly counseled. -On methadone for possible withdrawal symptoms.  #4 hypertension-continue diuretics with Lasix, Aldactone. Also on Norvasc.  #5 diabetes mellitus-on metformin.  #6 COPD-stable continue home inhalers and as needed nebulizers.  #7 DVT prophylaxis-on Lovenox.   All the records are reviewed and case discussed with Care Management/Social Workerr. Management plans discussed with the patient, family and they are in agreement.  CODE STATUS: Full code  TOTAL TIME TAKING CARE OF THIS PATIENT: 38 minutes.   POSSIBLE D/C IN 1-2 DAYS, DEPENDING ON CLINICAL CONDITION.   Tiernan Suto M.D on 11/06/2014 at 12:41 PM  Between 7am to 6pm - Pager - [phone]  After 6pm go to [email or web address] - password [email or web address] [facility]  Office  [phone]  CC: Primary care physician; Theotis Burrow, MD

## 2014-11-06 NOTE — H&P (Signed)
[facility] at [place] NAME: [name]    MR#:  [id number]  DATE OF BIRTH:  1955-05-25  DATE OF ADMISSION:  11/06/2014  PRIMARY CARE PHYSICIAN: Theotis Burrow, MD   REQUESTING/REFERRING PHYSICIAN: Conni Slipper  CHIEF COMPLAINT:   Chief Complaint  Patient presents with  . Fall    Pt. here via EMS from home for fall from bed.  Presents to ed with alterned mental status.    HISTORY OF PRESENT ILLNESS:  [name]  is a 59 y.o. male with a known history of polysubstance abuse in the past, hepatitis C, history of hepatic encephalopathy, hypertension, COPD, diabetes mellitus type 2, depression/anxiety, chronic backache was brought in by EMS with the complaints of found on the floor unresponsive by his parents at home. According to the ED physician's note patient was found on the floor by his parents unresponsive, EMS was called who found the patient with altered sensorium, hence brought to the emergency room for further evaluation. Patient was found to be confused on arrival to the ED with stable vital signs and afebrile. Workup revealed normal CBC, elevated liver function tests, troponin 0.03, lactic acid 2.6. CT head negative for acute intracranial pathology, CT C-spine negative for any acute injury, chest x-ray negative for acute cardiopulmonary pathology except for emphysema. EKG sinus tachycardia with ventricular rate of 10 8 bpm. Patient was given normal saline  and hospitalist service was consulted for further management. Urinalysis, urine drug screen, serum ammonia level are pending at this time. Patient is alert awake and oriented 3 and is comfortably resting in the bed, denies any complaints, states that he feels tired and sleepy since he did not have enough sleep yesterday. Patient denies any chest pain, shortness of breath, fever, cough, nausea, vomiting, diarrhea, abdominal pain, dysphagia, focal weakness or numbness. He  admits he did not take his medications yesterday because he was tired and sleepy.  PAST MEDICAL HISTORY:   Past Medical History  Diagnosis Date  . Anxiety unk  . Chronic back pain unk  . Diabetes mellitus without complication   . COPD (chronic obstructive pulmonary disease)   . Hypertension   . Arthritis   . Depression   . Hep C w/o coma, chronic   . Hepatitis C, chronic   . Hepatitis C, chronic     PAST SURGICAL HISTORY:  History reviewed. No pertinent past surgical history.  SOCIAL HISTORY:   Social History  Substance Use Topics  . Smoking status: Current Some Day Smoker  . Smokeless tobacco: Not on file  . Alcohol Use: No    FAMILY HISTORY:   Family History  Problem Relation Age of Onset  . Family history unknown: Yes    DRUG ALLERGIES:  No Known Allergies  REVIEW OF SYSTEMS:   Review of Systems  Constitutional: Negative for fever, chills and malaise/fatigue.  HENT: Negative for ear pain, hearing loss, nosebleeds, sore throat and tinnitus.   Eyes: Negative for blurred vision, double vision, pain, discharge and redness.  Respiratory: Negative for cough, hemoptysis, sputum production, shortness of breath and wheezing.   Cardiovascular: Negative for chest pain, palpitations, orthopnea and leg swelling.  Gastrointestinal: Negative for nausea, vomiting, abdominal pain, diarrhea, constipation, blood in stool and melena.  Genitourinary: Negative for dysuria, urgency, frequency and hematuria.  Musculoskeletal: Negative for back pain, joint pain and neck pain.  Skin: Negative for itching and rash.  Neurological: Negative for dizziness, tingling, sensory change, focal weakness and seizures.  Endo/Heme/Allergies: Does not bruise/bleed easily.  Psychiatric/Behavioral: Positive for depression. The patient is not nervous/anxious.     MEDICATIONS AT HOME:   Prior to Admission medications   Medication Sig Start Date End Date Taking? Authorizing Provider  ALPRAZolam  Duanne Moron) 0.5 MG tablet Take 0.5 mg by mouth every 8 (eight) hours as needed for anxiety.   Yes Historical Provider, MD  budesonide-formoterol (SYMBICORT) 160-4.5 MCG/ACT inhaler Inhale 2 puffs into the lungs 2 (two) times daily.   Yes Historical Provider, MD  furosemide (LASIX) 40 MG tablet Take 40 mg by mouth daily.   Yes Historical Provider, MD  lactulose (CHRONULAC) 10 GM/15ML solution Take 45 mLs (30 g total) by mouth 2 (two) times daily. Patient taking differently: Take 10 g by mouth 2 (two) times daily.  08/12/14  Yes Gladstone Lighter, MD  metFORMIN (GLUCOPHAGE) 500 MG tablet Take 500 mg by mouth 2 (two) times daily.   Yes Historical Provider, MD  methadone (DOLOPHINE) 10 MG/ML solution Take 20 mg by mouth every 8 (eight) hours.   Yes Historical Provider, MD  rifaximin (XIFAXAN) 550 MG TABS tablet Take 1 tablet (550 mg total) by mouth 2 (two) times daily. 08/12/14  Yes Gladstone Lighter, MD  spironolactone (ALDACTONE) 25 MG tablet Take 25 mg by mouth daily.   Yes Historical Provider, MD  tiotropium (SPIRIVA) 18 MCG inhalation capsule Place 18 mcg into inhaler and inhale daily.   Yes Historical Provider, MD  amLODipine (NORVASC) 5 MG tablet Take 1 tablet (5 mg total) by mouth daily. 08/12/14   Gladstone Lighter, MD  ipratropium-albuterol (DUONEB) 0.5-2.5 (3) MG/3ML SOLN Take 3 mLs by nebulization 4 (four) times daily as needed (for shortness of breath).    Historical Provider, MD  metoprolol (LOPRESSOR) 100 MG tablet Take 1 tablet (100 mg total) by mouth 2 (two) times daily. 08/12/14   Gladstone Lighter, MD  Multiple Vitamins-Minerals (MULTIVITAMIN PO) Take 1 tablet by mouth daily.    Historical Provider, MD  pantoprazole (PROTONIX) 40 MG tablet Take 1 tablet (40 mg total) by mouth daily. 08/12/14   Gladstone Lighter, MD      VITAL SIGNS:  Blood pressure 132/63, pulse 89, resp. rate 22, height 5\' 9"  (1.753 m), weight 68.04 kg (150 lb), SpO2 94 %.  PHYSICAL EXAMINATION:  Physical Exam   Constitutional: He is oriented to person, place, and time. He appears well-developed and well-nourished. No distress.  HENT:  Head: Normocephalic and atraumatic.  Right Ear: External ear normal.  Left Ear: External ear normal.  Nose: Nose normal.  Mouth/Throat: Oropharynx is clear and moist. No oropharyngeal exudate.  Eyes: EOM are normal. Pupils are equal, round, and reactive to light. No scleral icterus.  Neck: Normal range of motion. Neck supple. No JVD present. No thyromegaly present.  Cardiovascular: Normal rate, regular rhythm, normal heart sounds and intact distal pulses.  Exam reveals no friction rub.   No murmur heard. Respiratory: Effort normal and breath sounds normal. No respiratory distress. He has no wheezes. He has no rales. He exhibits no tenderness.  GI: Soft. Bowel sounds are normal. He exhibits no distension and no mass. There is no tenderness. There is no rebound and no guarding.  Musculoskeletal: Normal range of motion. He exhibits no edema.  Lymphadenopathy:    He has no cervical adenopathy.  Neurological: He is alert and oriented to person, place, and time. He has normal reflexes. He displays normal reflexes. No cranial nerve deficit. He exhibits normal muscle tone.  Skin: Skin is warm.  No rash noted. No erythema.  Psychiatric: He has a normal mood and affect. His behavior is normal. Thought content normal.   LABORATORY PANEL:   CBC  Recent Labs Lab 11/06/14 0124  WBC 9.7  HGB 14.0  HCT 40.8  PLT 131*   ------------------------------------------------------------------------------------------------------------------  Chemistries   Recent Labs Lab 11/06/14 0124  NA 138  K 3.8  CL 102  CO2 27  GLUCOSE 144*  BUN 23*  CREATININE 1.17  CALCIUM 9.3  AST 299*  ALT 221*  ALKPHOS 94  BILITOT 2.5*   ------------------------------------------------------------------------------------------------------------------  Cardiac Enzymes  Recent Labs Lab  11/06/14 0124  TROPONINI 0.03   ------------------------------------------------------------------------------------------------------------------  RADIOLOGY:  Ct Head Wo Contrast  11/06/2014   CLINICAL DATA:  Patient fell out of bed. Altered mental status. History of seizures.  EXAM: CT HEAD WITHOUT CONTRAST  CT CERVICAL SPINE WITHOUT CONTRAST  TECHNIQUE: Multidetector CT imaging of the head and cervical spine was performed following the standard protocol without intravenous contrast. Multiplanar CT image reconstructions of the cervical spine were also generated.  COMPARISON:  CT head 08/10/2014  FINDINGS: CT HEAD FINDINGS  Ventricles and sulci appear symmetrical. No mass effect or midline shift. No abnormal extra-axial fluid collections. Gray-white matter junctions are distinct. Basal cisterns are not effaced. No evidence of acute intracranial hemorrhage. No depressed skull fractures. Visualized paranasal sinuses and mastoid air cells are not opacified. Vascular calcifications.  CT CERVICAL SPINE FINDINGS  Straightening of the usual cervical lordosis. This may be due to patient positioning but ligamentous injury or muscle spasm can also have this appearance and is not excluded. No anterior subluxation. Normal alignment of the facet joints. No vertebral compression deformities. Degenerative changes at C5-6 and C6-7 levels. No prevertebral soft tissue swelling. C1-2 articulation appears intact. No focal bone lesion or bone destruction. Bone cortex and trabecular architecture appear intact. Vascular calcifications in the cervical carotid arteries. Emphysematous changes in the lung apices.  IMPRESSION: No acute intracranial abnormalities.  Nonspecific straightening of the usual cervical lordosis. Mild degenerative changes in the cervical spine. No acute displaced fractures identified.   Electronically Signed   By: Lucienne Capers M.D.   On: 11/06/2014 01:22   Ct Cervical Spine Wo Contrast  11/06/2014    CLINICAL DATA:  Patient fell out of bed. Altered mental status. History of seizures.  EXAM: CT HEAD WITHOUT CONTRAST  CT CERVICAL SPINE WITHOUT CONTRAST  TECHNIQUE: Multidetector CT imaging of the head and cervical spine was performed following the standard protocol without intravenous contrast. Multiplanar CT image reconstructions of the cervical spine were also generated.  COMPARISON:  CT head 08/10/2014  FINDINGS: CT HEAD FINDINGS  Ventricles and sulci appear symmetrical. No mass effect or midline shift. No abnormal extra-axial fluid collections. Gray-white matter junctions are distinct. Basal cisterns are not effaced. No evidence of acute intracranial hemorrhage. No depressed skull fractures. Visualized paranasal sinuses and mastoid air cells are not opacified. Vascular calcifications.  CT CERVICAL SPINE FINDINGS  Straightening of the usual cervical lordosis. This may be due to patient positioning but ligamentous injury or muscle spasm can also have this appearance and is not excluded. No anterior subluxation. Normal alignment of the facet joints. No vertebral compression deformities. Degenerative changes at C5-6 and C6-7 levels. No prevertebral soft tissue swelling. C1-2 articulation appears intact. No focal bone lesion or bone destruction. Bone cortex and trabecular architecture appear intact. Vascular calcifications in the cervical carotid arteries. Emphysematous changes in the lung apices.  IMPRESSION: No acute intracranial abnormalities.  Nonspecific  straightening of the usual cervical lordosis. Mild degenerative changes in the cervical spine. No acute displaced fractures identified.   Electronically Signed   By: Lucienne Capers M.D.   On: 11/06/2014 01:22   Dg Chest Portable 1 View  11/06/2014   CLINICAL DATA:  Altered mental status, fell out of bed.  EXAM: PORTABLE CHEST - 1 VIEW  COMPARISON:  Frontal and lateral views 08/10/2014  FINDINGS: The lungs remain hyperinflated with diffuse emphysema. The  cardiomediastinal contours are normal, there is atherosclerosis of the thoracic aorta. Pulmonary vasculature is normal. No consolidation, pleural effusion, or pneumothorax. No acute osseous abnormalities are seen.  IMPRESSION: Emphysema without acute process.   Electronically Signed   By: Jeb Levering M.D.   On: 11/06/2014 01:46    EKG:   Orders placed or performed during the hospital encounter of 11/06/14  . ED EKG  . ED EKG  Sinus tachycardia with ventricular rate of 100 bpm, no acute ST-T abnormalities.  IMPRESSION AND PLAN:   59 year old male with history of hepatitis C, hepatic encephalopathy, hypertension, diabetes mellitus type 2, COPD, history of polysubstance abuse, depression presents with the complaints of found on the floor unresponsive by his parents, noted to be confused by EMS.  1. Acute encephalopathy. Etiology not clear at this time, history of hepatic encephalopathy in the past. Patient with known history of hepatitis C, serum ammonia level pending at this time. Likely metabolic cause-hepatic encephalopathy. Less likely to be infectious since patient was not sick, afebrile and normal WBC. Mental status has improved and patient is alert awake and oriented 3. Plan: Admit to MedSurg, neuro watch, IV hydration, follow-up blood cultures and urine cultures, follow-up serum ammonia level, continue lactulose. 2. Hepatitis C. LFTs elevated but stable. Patient on multiple medications. Continue same. Monitor LFTs and serum ammonia levels. 3. Hypertension, stable on home medications. Continue same.  4. Diabetes mellitus type 2, stable on home medications. Continue same plus sliding scale insulin. 5. COPD, stable clinically.. Continue home medications. 6. Chronic backache, on methadone. Continue same. 7. Depression stable on home medications. Continue same. 8. History of polysubstance abuse in the past. Patient denies using any drugs lately but as per nursing note question history of  cocaine use. Urine tox pending. Follow-up urine tox. 9. Borderline elevated troponin of 0.03. No cardiac history, no chest pain, no ischemic changes on EKG. Plan: Telemetry monitoring, cycle cardiac enzymes, follow up accordingly.    All the records are reviewed and case discussed with ED provider. Management plans discussed with the patient & in agreement.  CODE STATUS: Full code  TOTAL TIME TAKING CARE OF THIS PATIENT: 50 minutes.    Azucena Freed N M.D on 11/06/2014 at 4:31 AM  Between 7am to 6pm - Pager - [phone]  After 6pm go to [email or web address] - password [email or web address] [facility]  Office  [phone]  CC: Primary care physician; Theotis Burrow, MD

## 2014-11-06 NOTE — Progress Notes (Addendum)
Patient had not voided today. Bladder scan revealed 768ml. Dr. Carlyle Dolly advised in/out cath which produced 832ml urine. Patient states that he is feeling "much better".

## 2014-11-06 NOTE — ED Notes (Signed)
Pt. Presents to ed via EMS from home.  Parents state they found pt. Off bed and unresponsive to them.  Per EMS pt. Was confused upon arrival.

## 2014-11-06 NOTE — Progress Notes (Signed)
PT HAS BEEN OOB TO VOIDX2 FOR PAST 20 MINUTES . REPORTS " I CAN'T PEE AND HAVE NOT [name] SINCE YESTERDAY AFTERNOON". BLADDER SCAN  792. DR Tressia Miners NOTIFIED . MD ORDERS IN AND OUT CATH ONCE

## 2014-11-06 NOTE — ED Notes (Signed)
Patient transported to CT 

## 2014-11-06 NOTE — ED Notes (Signed)
Pads were placed on floor of patient's room, surrounding the bed, for safety. Bed alarm attached to patient's gown.

## 2014-11-06 NOTE — Progress Notes (Signed)
Paged Dr Reece Levy re: patient elevated troponin

## 2014-11-06 NOTE — ED Provider Notes (Signed)
[facility] Emergency Department Provider Note  ____________________________________________  Time seen: Approximately 12:15 AM  I have reviewed the triage vital signs and the nursing notes.   HISTORY  Chief Complaint Fall  Chief complaint per EMS his altered mental status. Patient's history was supplied to EMS by patient's mother.  HPI Latavious Bitter is a 59 y.o. male patient lives with mother and father. Patient's mother reports to EMS that patient had gone to the store market to buy lottery ticket 3 times today and every time he came back he was acting normal what the bed fell out of bed and became confused. EMS reports that when they got there his blood pressure was 939 systolic head since come down to 030 systolic patient is still somewhat confused. Patient says somebody gave him some cocaine which he smoked yesterday. 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 Anxiety unk  . Chronic back pain unk  . Diabetes mellitus without complication   . COPD (chronic obstructive pulmonary disease)     Patient Active Problem List   Diagnosis Date Noted  . Hepatic encephalopathy 08/10/2014  . HTN (hypertension) 08/10/2014  . Hepatitis C 08/10/2014  . Polysubstance abuse 08/10/2014    No past surgical history on file.  Current Outpatient Rx  Name  Route  Sig  Dispense  Refill  . ALPRAZolam (XANAX) 0.5 MG tablet   Oral   Take 0.5 mg by mouth every 8 (eight) hours as needed for anxiety.         . budesonide-formoterol (SYMBICORT) 160-4.5 MCG/ACT inhaler   Inhalation   Inhale 2 puffs into the lungs 2 (two) times daily.         . furosemide (LASIX) 40 MG tablet   Oral   Take 40 mg by mouth daily.         Marland Kitchen lactulose (CHRONULAC) 10 GM/15ML solution   Oral   Take 45 mLs (30 g total) by mouth 2 (two) times daily. Patient taking differently: Take 10 g by mouth 2 (two) times daily.    240 mL   2   . metFORMIN (GLUCOPHAGE) 500 MG tablet   Oral   Take 500 mg by mouth 2 (two) times daily.         . methadone (DOLOPHINE) 10 MG/ML solution   Oral   Take 20 mg by mouth every 8 (eight) hours.         . rifaximin (XIFAXAN) 550 MG TABS tablet   Oral   Take 1 tablet (550 mg total) by mouth 2 (two) times daily.   60 tablet   1   . spironolactone (ALDACTONE) 25 MG tablet   Oral   Take 25 mg by mouth daily.         Marland Kitchen tiotropium (SPIRIVA) 18 MCG inhalation capsule   Inhalation   Place 18 mcg into inhaler and inhale daily.         Marland Kitchen amLODipine (NORVASC) 5 MG tablet   Oral   Take 1 tablet (5 mg total) by mouth daily.   30 tablet   1   . ipratropium-albuterol (DUONEB) 0.5-2.5 (3) MG/3ML SOLN   Nebulization   Take 3 mLs by nebulization 4 (  four) times daily as needed (for shortness of breath).         . metoprolol (LOPRESSOR) 100 MG tablet   Oral   Take 1 tablet (100 mg total) by mouth 2 (two) times daily.   60 tablet   1   . Multiple Vitamins-Minerals (MULTIVITAMIN PO)   Oral   Take 1 tablet by mouth daily.         . pantoprazole (PROTONIX) 40 MG tablet   Oral   Take 1 tablet (40 mg total) by mouth daily.   30 tablet   1     Allergies Review of patient's allergies indicates no known allergies.  No family history on file.  Social History Social History  Substance Use Topics  . Smoking status: Current Some Day Smoker  . Smokeless tobacco: Not on file  . Alcohol Use: No    Review of Systems Constitutional: No fever/chills Eyes: No visual changes. ENT: No sore throat. Cardiovascular: Denies chest pain. Respiratory: Denies shortness of breath. Gastrointestinal: No abdominal pain.  No nausea, no vomiting.  No diarrhea.  No constipation. Genitourinary:  Negative for dysuria. Musculoskeletal: Negative for back pain. Skin: Negative for rash. Neurological: Negative for headaches, focal weakness or numbness.  10-point ROS otherwise negative.  ____________________________________________   PHYSICAL EXAM:  VITAL SIGNS: ED Triage Vitals  Enc Vitals Group     BP --      Pulse --      Resp --      Temp --      Temp src --      SpO2 --      Weight --      Height --      Head Cir --      Peak Flow --      Pain Score --      Pain Loc --      Pain Edu? --      Excl. in [place]? --    Constitutional: Alert and oriented. Well appearing and in no acute distress. He is somewhat confused Eyes: Conjunctivae are normal. PERRL. EOMI. Head: Atraumatic. Nose: No congestion/rhinnorhea. Mouth/Throat: Mucous membranes are moist.  Oropharynx non-erythematous. Neck: No stridor.  Cardiovascular: Normal rate, regular rhythm. Grossly normal heart sounds.  Good peripheral circulation. Respiratory: Normal respiratory effort.  No retractions. Lungs CTAB. Gastrointestinal: Soft and nontender. No distention. No abdominal bruits. No CVA tenderness. Musculoskeletal: No lower extremity tenderness nor edema.  No joint effusions. Neurologic:  Normal speech and language. No gross focal neurologic deficits are appreciated.  Skin:  Skin is warm, dry and intact. No rash noted.   ____________________________________________   LABS (all labs ordered are listed, but only abnormal results are displayed)  Labs Reviewed  COMPREHENSIVE METABOLIC PANEL - Abnormal; Notable for the following:    Glucose, Bld 144 (*)    BUN 23 (*)    AST 299 (*)    ALT 221 (*)    Total Bilirubin 2.5 (*)    All other components within normal limits  ACETAMINOPHEN LEVEL - Abnormal; Notable for the following:    Acetaminophen (Tylenol), Serum <10 (*)    All other components within normal limits  LACTIC ACID, PLASMA - Abnormal; Notable for the following:    Lactic Acid, Venous 2.6 (*)     All other components within normal limits  CBC WITH DIFFERENTIAL/PLATELET - Abnormal; Notable for the following:    RBC 4.21 (*)    Platelets 131 (*)    Monocytes Absolute  1.2 (*)    All other components within normal limits  ETHANOL  TROPONIN I  SALICYLATE LEVEL  LACTIC ACID, PLASMA  URINE DRUG SCREEN, QUALITATIVE ([facility] ONLY)  URINALYSIS COMPLETEWITH MICROSCOPIC ([facility] ONLY)  AMMONIA  CBG MONITORING, ED   ____________________________________________  EKG  EKG read and interpreted by me shows sinus tachycardia at a rate of 108 normal axis no ST-T changes ____________________________________________  RADIOLOGY  CT of the head and neck read as no acute disease by radiology ____________________________________________   PROCEDURES    ____________________________________________   INITIAL IMPRESSION / ASSESSMENT AND PLAN / ED COURSE  Pertinent labs & imaging results that were available during my care of the patient were reviewed by me and considered in my medical decision making (see chart for details).   ____________________________________________   FINAL CLINICAL IMPRESSION(S) / ED DIAGNOSES  Final diagnoses:  Altered mental status, unspecified altered mental status type      Nena Polio, MD 11/06/14 [id number]

## 2014-11-06 NOTE — Progress Notes (Signed)
Spoke with Dr. Tressia Miners regarding patients elevated troponin of 0.04

## 2014-11-06 NOTE — ED Notes (Signed)
Pt. States he takes methadone on regular basis per prescription.  Pt. States he accidentally was given cocaine from a friend via vapor device yesterday.

## 2014-11-07 LAB — BASIC METABOLIC PANEL
ANION GAP: 6 (ref 5–15)
BUN: 15 mg/dL (ref 6–20)
CHLORIDE: 106 mmol/L (ref 101–111)
CO2: 26 mmol/L (ref 22–32)
Calcium: 8.2 mg/dL — ABNORMAL LOW (ref 8.9–10.3)
Creatinine, Ser: 0.87 mg/dL (ref 0.61–1.24)
GFR calc Af Amer: >60 mL/min (ref >60)
GLUCOSE: 69 mg/dL (ref 65–99)
POTASSIUM: 3.5 mmol/L (ref 3.5–5.1)
Sodium: 138 mmol/L (ref 135–145)

## 2014-11-07 LAB — GLUCOSE, CAPILLARY
GLUCOSE-CAPILLARY: 55 mg/dL — AB (ref 65–99)
Glucose-Capillary: 105 mg/dL — ABNORMAL HIGH (ref 65–99)
Glucose-Capillary: 102 mg/dL — ABNORMAL HIGH (ref 65–99)

## 2014-11-07 LAB — AMMONIA: Ammonia: 49 umol/L — ABNORMAL HIGH (ref 9–35)

## 2014-11-07 MED ORDER — SODIUM CHLORIDE 0.9 % IV SOLN
INTRAVENOUS | Status: AC
  Administered 2014-11-07: 11:00:00 via INTRAVENOUS

## 2014-11-07 MED ORDER — METOPROLOL TARTRATE 25 MG PO TABS: 25.0000 mg | ORAL_TABLET | Freq: Two times a day (BID) | ORAL | Status: DC

## 2014-11-07 NOTE — Care Management (Signed)
Patient decline RNCM assessment. He also declined Home Health. He said he "has enough pain to take care of at home"- referring to his "father that has Alzheimer's". Case closed.

## 2014-11-07 NOTE — Progress Notes (Addendum)
Patient has been complaining for his methadone but Dr. Dede Query wanted morning dose held d/t low b/p. Spoke with her and she added 587mL NS. Sister here to pick up patient and only 1/2 the fluid has run. Per Dr. Dede Query okay to d/c patient without all fluids run. IV taken out; discharge instructions given. VSS at time of discharge. Patient signed & took wrong copy of d/c paperwork so his signature is not on our copy of discharge.

## 2014-11-07 NOTE — Progress Notes (Signed)
Contacted dr hower patient unable to void. In and out cath earlier today . Bladder sca n  >542 order to insert cath and leave but on return to patients  room  He was successful to void in urinal 540 mls.

## 2014-11-07 NOTE — Discharge Summary (Signed)
[place] [facility] [place] NAME: [name]    MR#:  [id number]  DATE OF BIRTH:  Jul 14, 1955  DATE OF ADMISSION:  11/06/2014 ADMITTING PHYSICIAN: Juluis Mire, MD  DATE OF DISCHARGE: 11/07/2014  PRIMARY CARE PHYSICIAN: Theotis Burrow, MD    ADMISSION DIAGNOSIS:  Altered mental status, unspecified altered mental status type [R41.82]  DISCHARGE DIAGNOSIS:  Principal Problem:   Acute encephalopathy Active Problems:   HTN (hypertension)   Hepatitis C   DM (diabetes mellitus)   COPD (chronic obstructive pulmonary disease)   Depression   SECONDARY DIAGNOSIS:   Past Medical History  Diagnosis Date  . Anxiety unk  . Chronic back pain unk  . Diabetes mellitus without complication   . COPD (chronic obstructive pulmonary disease)   . Hypertension   . Arthritis   . Depression   . Hep C w/o coma, chronic   . Hepatitis C, chronic   . Hepatitis C, chronic     HOSPITAL COURSE:   59 year old male with past medical history significant for liver cirrhosis, hepatitis C, polysubstance abuse and hepatic encephalopathy presents to the hospital secondary to altered mental status  #1 unresponsiveness/altered mental status-toxic encephalopathy -Polysubstance abuse likely. Urine tox screen positive for benzos, for Barbiturates, cocaine and PCP - CT head negative for any changes. - Resolved now. - Ammonia is at 21 now, could be his baseline with his liver cirrhosis. -patient is already alert and oriented at this time. -No evidence of any infection.  #2 liver cirrhosis without ascites-LFTs are elevated but stable. -Abdominal ultrasound with cirrhosis, no ascites noted. -Ammonia is 49. Continue xifaxan and lactulose. - cont diuretics as needed  #3 polysubstance abuse-monitor or any withdrawals. -Strongly counseled. -On methadone for chronic pain issues.  #4 hypertension-continue diuretics with Lasix, Aldactone. Also on  Norvasc and low dose metoprolol. Metoprolol dose reduced due to low normal BP here.  #5 diabetes mellitus-discontinue metformin as sugars have been on the lower side since admission. Encourage to eat..  #6 COPD-stable continue home inhalers and as needed nebulizers.  #7 Acute urinary retention- yesterday, bladder scan with 700cc urine- In and out cath done. Last night was able to void >500cc urine and doing well now.  Discharge today Strong counselling against substance use  DISCHARGE CONDITIONS:   Guarded  CONSULTS OBTAINED:   None  DRUG ALLERGIES:  No Known Allergies  DISCHARGE MEDICATIONS:   Current Discharge Medication List    CONTINUE these medications which have CHANGED   Details  metoprolol (LOPRESSOR) 25 MG tablet Take 1 tablet (25 mg total) by mouth 2 (two) times daily. Qty: 60 tablet, Refills: 1      CONTINUE these medications which have NOT CHANGED   Details  ALPRAZolam (XANAX) 0.5 MG tablet Take 0.5 mg by mouth every 8 (eight) hours as needed for anxiety.    budesonide-formoterol (SYMBICORT) 160-4.5 MCG/ACT inhaler Inhale 2 puffs into the lungs 2 (two) times daily.    furosemide (LASIX) 40 MG tablet Take 40 mg by mouth daily.    lactulose (CHRONULAC) 10 GM/15ML solution Take 45 mLs (30 g total) by mouth 2 (two) times daily. Qty: 240 mL, Refills: 2    methadone (DOLOPHINE) 10 MG/ML solution Take 20 mg by mouth every 8 (eight) hours.    rifaximin (XIFAXAN) 550 MG TABS tablet Take 1 tablet (550 mg total) by mouth 2 (two) times daily. Qty: 60 tablet, Refills: 1    spironolactone (ALDACTONE) 25 MG tablet Take 25  mg by mouth daily.    tiotropium (SPIRIVA) 18 MCG inhalation capsule Place 18 mcg into inhaler and inhale daily.    amLODipine (NORVASC) 5 MG tablet Take 1 tablet (5 mg total) by mouth daily. Qty: 30 tablet, Refills: 1    ipratropium-albuterol (DUONEB) 0.5-2.5 (3) MG/3ML SOLN Take 3 mLs by nebulization 4 (four) times daily as needed (for  shortness of breath).    Multiple Vitamins-Minerals (MULTIVITAMIN PO) Take 1 tablet by mouth daily.    pantoprazole (PROTONIX) 40 MG tablet Take 1 tablet (40 mg total) by mouth daily. Qty: 30 tablet, Refills: 1      STOP taking these medications     metFORMIN (GLUCOPHAGE) 500 MG tablet          DISCHARGE INSTRUCTIONS:   1. PCP f/u in 1 week 2. Substance abuse rehab   If you experience worsening of your admission symptoms, develop shortness of breath, life threatening emergency, suicidal or homicidal thoughts you must seek medical attention immediately by calling 911 or calling your MD immediately  if symptoms less severe.  You Must read complete instructions/literature along with all the possible adverse reactions/side effects for all the Medicines you take and that have been prescribed to you. Take any new Medicines after you have completely understood and accept all the possible adverse reactions/side effects.   Please note  You were cared for by a hospitalist during your hospital stay. If you have any questions about your discharge medications or the care you received while you were in the hospital after you are discharged, you can call the unit and asked to speak with the hospitalist on call if the hospitalist that took care of you is not available. Once you are discharged, your primary care physician will handle any further medical issues. Please note that NO REFILLS for any discharge medications will be authorized once you are discharged, as it is imperative that you return to your primary care physician (or establish a relationship with a primary care physician if you do not have one) for your aftercare needs so that they can reassess your need for medications and monitor your lab values.    Today   CHIEF COMPLAINT:   Chief Complaint  Patient presents with  . Fall    Pt. here via EMS from home for fall from bed.  Presents to ed with alterned mental status.     VITAL  SIGNS:  Blood pressure 93/56, pulse 66, temperature 98 F (36.7 C), temperature source Oral, resp. rate 17, height 5\' 11"  (1.803 m), weight 68.448 kg (150 lb 14.4 oz), SpO2 92 %.  I/O:   Intake/Output Summary (Last 24 hours) at 11/07/14 0931 Last data filed at 11/07/14 0657  Gross per 24 hour  Intake    992 ml  Output   1250 ml  Net   -258 ml    PHYSICAL EXAMINATION:   Physical Exam  GENERAL: 59 y.o.-year-old patient lying in the bed with no acute distress.  EYES: Pupils equal, round, reactive to light and accommodation. No scleral icterus. Extraocular muscles intact.  HEENT: Head atraumatic, normocephalic. Oropharynx and nasopharynx clear.  NECK: Supple, no jugular venous distention. No thyroid enlargement, no tenderness.  LUNGS: Normal breath sounds bilaterally, no wheezing, rales,rhonchi or crepitation. No use of accessory muscles of respiration.  CARDIOVASCULAR: S1, S2 normal. No rubs, or gallops. Systolic murmur heard. ABDOMEN: Soft, nontender, nondistended. Bowel sounds present. No organomegaly or mass.  EXTREMITIES: No pedal edema, cyanosis, or clubbing.  NEUROLOGIC: Cranial  nerves II through XII are intact. Muscle strength 5/5 in all extremities. Sensation intact. Gait not checked. Tremors of hands noted. PSYCHIATRIC: The patient is alert and oriented x 3.  SKIN: No obvious rash, lesion, or ulcer.   DATA REVIEW:   CBC  Recent Labs Lab 11/06/14 0124  WBC 9.7  HGB 14.0  HCT 40.8  PLT 131*    Chemistries   Recent Labs Lab 11/06/14 0124 11/07/14 0440  NA 138 138  K 3.8 3.5  CL 102 106  CO2 27 26  GLUCOSE 144* 69  BUN 23* 15  CREATININE 1.17 0.87  CALCIUM 9.3 8.2*  AST 299*  --   ALT 221*  --   ALKPHOS 94  --   BILITOT 2.5*  --     Cardiac Enzymes  Recent Labs Lab 11/06/14 1832  TROPONINI 0.03    Microbiology Results  Results for orders placed or performed during the hospital encounter of 06/13/14  Culture, blood (single)      Status: None   Collection Time: 06/13/14  2:59 AM  Result Value Ref Range Status   Micro Text Report   Final       COMMENT                   NO GROWTH AEROBICALLY/ANAEROBICALLY IN 5 DAYS   ANTIBIOTIC                                                      Culture, blood (single)     Status: None   Collection Time: 06/13/14  5:20 AM  Result Value Ref Range Status   Micro Text Report   Final       COMMENT                   NO GROWTH AEROBICALLY/ANAEROBICALLY IN 5 DAYS   ANTIBIOTIC                                                      Culture, expectorated sputum-assessment     Status: None   Collection Time: 06/13/14 11:27 AM  Result Value Ref Range Status   Micro Text Report   Final       COMMENT                   CONSISTENT WITH NORMAL FLORA   GRAM STAIN                FAIR SPECIMEN-70-80% WBC   GRAM STAIN                FEW WHITE BLOOD CELLS   GRAM STAIN                FEW GRAM POSITIVE COCCI IN PAIRS IN CHAINS   GRAM STAIN                RARE GRAM VARIABLE ROD   ANTIBIOTIC  RADIOLOGY:  Ct Head Wo Contrast  11/06/2014   CLINICAL DATA:  Patient fell out of bed. Altered mental status. History of seizures.  EXAM: CT HEAD WITHOUT CONTRAST  CT CERVICAL SPINE WITHOUT CONTRAST  TECHNIQUE: Multidetector CT imaging of the head and cervical spine was performed following the standard protocol without intravenous contrast. Multiplanar CT image reconstructions of the cervical spine were also generated.  COMPARISON:  CT head 08/10/2014  FINDINGS: CT HEAD FINDINGS  Ventricles and sulci appear symmetrical. No mass effect or midline shift. No abnormal extra-axial fluid collections. Gray-white matter junctions are distinct. Basal cisterns are not effaced. No evidence of acute intracranial hemorrhage. No depressed skull fractures. Visualized paranasal sinuses and mastoid air cells are not opacified. Vascular calcifications.  CT CERVICAL SPINE  FINDINGS  Straightening of the usual cervical lordosis. This may be due to patient positioning but ligamentous injury or muscle spasm can also have this appearance and is not excluded. No anterior subluxation. Normal alignment of the facet joints. No vertebral compression deformities. Degenerative changes at C5-6 and C6-7 levels. No prevertebral soft tissue swelling. C1-2 articulation appears intact. No focal bone lesion or bone destruction. Bone cortex and trabecular architecture appear intact. Vascular calcifications in the cervical carotid arteries. Emphysematous changes in the lung apices.  IMPRESSION: No acute intracranial abnormalities.  Nonspecific straightening of the usual cervical lordosis. Mild degenerative changes in the cervical spine. No acute displaced fractures identified.   Electronically Signed   By: Lucienne Capers M.D.   On: 11/06/2014 01:22   Ct Cervical Spine Wo Contrast  11/06/2014   CLINICAL DATA:  Patient fell out of bed. Altered mental status. History of seizures.  EXAM: CT HEAD WITHOUT CONTRAST  CT CERVICAL SPINE WITHOUT CONTRAST  TECHNIQUE: Multidetector CT imaging of the head and cervical spine was performed following the standard protocol without intravenous contrast. Multiplanar CT image reconstructions of the cervical spine were also generated.  COMPARISON:  CT head 08/10/2014  FINDINGS: CT HEAD FINDINGS  Ventricles and sulci appear symmetrical. No mass effect or midline shift. No abnormal extra-axial fluid collections. Gray-white matter junctions are distinct. Basal cisterns are not effaced. No evidence of acute intracranial hemorrhage. No depressed skull fractures. Visualized paranasal sinuses and mastoid air cells are not opacified. Vascular calcifications.  CT CERVICAL SPINE FINDINGS  Straightening of the usual cervical lordosis. This may be due to patient positioning but ligamentous injury or muscle spasm can also have this appearance and is not excluded. No anterior  subluxation. Normal alignment of the facet joints. No vertebral compression deformities. Degenerative changes at C5-6 and C6-7 levels. No prevertebral soft tissue swelling. C1-2 articulation appears intact. No focal bone lesion or bone destruction. Bone cortex and trabecular architecture appear intact. Vascular calcifications in the cervical carotid arteries. Emphysematous changes in the lung apices.  IMPRESSION: No acute intracranial abnormalities.  Nonspecific straightening of the usual cervical lordosis. Mild degenerative changes in the cervical spine. No acute displaced fractures identified.   Electronically Signed   By: Lucienne Capers M.D.   On: 11/06/2014 01:22   US Abdomen Complete  11/06/2014   CLINICAL DATA:  Cirrhosis, diabetes mellitus, hypertension, COPD, hepatitis-C, smoker  EXAM: ULTRASOUND ABDOMEN COMPLETE  COMPARISON:  08/12/2014  FINDINGS: Gallbladder: Well distended. No definite wall thickening, shadowing calculi, pericholecystic fluid or sonographic Murphy sign.  Common bile duct: Diameter: 3 mm diameter, normal  Liver: Increased echogenicity with nodular contours consistent with cirrhosis. Hepatopetal portal venous flow. No discrete hepatic mass.  IVC: Normal appearance  Pancreas: Portions  of head and distal tail incompletely visualized due to bowel gas, visualized portions normal appearance  Spleen: Normal appearance, 9.3 cm length  Right Kidney: Length: 12.0 cm. Normal cortical thickness. Upper normal cortical echogenicity. No mass or hydronephrosis.  Left Kidney: Length: 12.0 cm. Normal cortical thickness. Upper normal cortical echogenicity. No mass or hydronephrosis.  Abdominal aorta: Midportion obscured by bowel gas, with visualized portions normal caliber.  Other findings: No ascites  IMPRESSION: Cirrhotic appearing liver without definite mass.  Incomplete visualization of pancreas and aorta.  Remainder of exam unremarkable.   Electronically Signed   By: Lavonia Dana M.D.   On:  11/06/2014 17:30   Dg Chest Portable 1 View  11/06/2014   CLINICAL DATA:  Altered mental status, fell out of bed.  EXAM: PORTABLE CHEST - 1 VIEW  COMPARISON:  Frontal and lateral views 08/10/2014  FINDINGS: The lungs remain hyperinflated with diffuse emphysema. The cardiomediastinal contours are normal, there is atherosclerosis of the thoracic aorta. Pulmonary vasculature is normal. No consolidation, pleural effusion, or pneumothorax. No acute osseous abnormalities are seen.  IMPRESSION: Emphysema without acute process.   Electronically Signed   By: Jeb Levering M.D.   On: 11/06/2014 01:46    EKG:   Orders placed or performed during the hospital encounter of 11/06/14  . ED EKG  . ED EKG  . EKG 12-Lead  . EKG 12-Lead      Management plans discussed with the patient, family and they are in agreement.  CODE STATUS:     Code Status Orders        Start     Ordered   11/06/14 0552  Full code   Continuous     11/06/14 0551      TOTAL TIME TAKING CARE OF THIS PATIENT: 37 minutes.    Gladstone Lighter M.D on 11/07/2014 at 9:31 AM  Between 7am to 6pm - Pager - [phone]  After 6pm go to [email or web address] - password [email or web address] [facility]  Office  [phone]  CC: Primary care physician; Theotis Burrow, MD

## 2015-03-08 ENCOUNTER — Emergency Department: Payer: Medicaid Other

## 2015-03-08 ENCOUNTER — Emergency Department
Admission: EM | Admit: 2015-03-08 | Discharge: 2015-03-08 | Disposition: A | Discharge status: Home / Self Care | Payer: Medicaid Other | Attending: Emergency Medicine | Admitting: Emergency Medicine

## 2015-03-08 DIAGNOSIS — Y9301 Activity, walking, marching and hiking: Secondary | ICD-10-CM | POA: Diagnosis not present

## 2015-03-08 DIAGNOSIS — Z79891 Long term (current) use of opiate analgesic: Secondary | ICD-10-CM | POA: Insufficient documentation

## 2015-03-08 DIAGNOSIS — I10 Essential (primary) hypertension: Secondary | ICD-10-CM | POA: Diagnosis not present

## 2015-03-08 DIAGNOSIS — E119 Type 2 diabetes mellitus without complications: Secondary | ICD-10-CM | POA: Insufficient documentation

## 2015-03-08 DIAGNOSIS — S20212A Contusion of left front wall of thorax, initial encounter: Secondary | ICD-10-CM | POA: Diagnosis not present

## 2015-03-08 DIAGNOSIS — Z792 Long term (current) use of antibiotics: Secondary | ICD-10-CM | POA: Diagnosis not present

## 2015-03-08 DIAGNOSIS — Y92828 Other wilderness area as the place of occurrence of the external cause: Secondary | ICD-10-CM | POA: Diagnosis not present

## 2015-03-08 DIAGNOSIS — F17200 Nicotine dependence, unspecified, uncomplicated: Secondary | ICD-10-CM | POA: Insufficient documentation

## 2015-03-08 DIAGNOSIS — Y998 Other external cause status: Secondary | ICD-10-CM | POA: Diagnosis not present

## 2015-03-08 DIAGNOSIS — W1781XA Fall down embankment (hill), initial encounter: Secondary | ICD-10-CM | POA: Insufficient documentation

## 2015-03-08 DIAGNOSIS — Z79899 Other long term (current) drug therapy: Secondary | ICD-10-CM | POA: Insufficient documentation

## 2015-03-08 DIAGNOSIS — Z7951 Long term (current) use of inhaled steroids: Secondary | ICD-10-CM | POA: Diagnosis not present

## 2015-03-08 DIAGNOSIS — S29001A Unspecified injury of muscle and tendon of front wall of thorax, initial encounter: Secondary | ICD-10-CM | POA: Diagnosis present

## 2015-03-08 MED ORDER — OXYCODONE-ACETAMINOPHEN 5-325 MG PO TABS
1.0000 | ORAL_TABLET | Freq: Once | ORAL | Status: AC
  Administered 2015-03-08: 1 via ORAL
  Filled 2015-03-08: qty 1

## 2015-03-08 MED ORDER — MELOXICAM 15 MG PO TABS: 15.0000 mg | ORAL_TABLET | Freq: Every day | ORAL | Status: DC

## 2015-03-08 NOTE — ED Provider Notes (Signed)
[facility] Emergency Department Provider Note  ____________________________________________  Time seen: Approximately 3:14 PM  I have reviewed the triage vital signs and the nursing notes.   HISTORY  Chief Complaint Fall    HPI [name] is a 59 y.o. male who presents to emergency department complaining of bilateral rib pain. He states that he was walking down a hill, slipped and somewhat wheezes/grass and fell on the anterior surface of his rib cage. Patient is endorsing pain to bilateral anterior ribs that is worse on the right than left. Patient states pain is constant, sharp, worse with breathing.Patient denies any shortness of breath, headache, visual acuity changes, neck pain, or back pain, radicular symptoms. Patient did not hit his head or lose consciousness.   Past Medical History  Diagnosis Date  . Anxiety unk  . Chronic back pain unk  . Diabetes mellitus without complication ([place])   . COPD (chronic obstructive pulmonary disease) ([place])   . Hypertension   . Arthritis   . Depression   . Hep C w/o coma, chronic ([place])   . Hepatitis C, chronic ([place])   . Hepatitis C, chronic [facility])     Patient Active Problem List   Diagnosis Date Noted  . Acute encephalopathy 11/06/2014  . DM (diabetes mellitus) ([place]) 11/06/2014  . COPD (chronic obstructive pulmonary disease) ([place]) 11/06/2014  . Depression 11/06/2014  . Hepatic encephalopathy ([place]) 08/10/2014  . HTN (hypertension) 08/10/2014  . Hepatitis C 08/10/2014  . Polysubstance abuse 08/10/2014    History reviewed. No pertinent past surgical history.  Current Outpatient Rx  Name  Route  Sig  Dispense  Refill  . ALPRAZolam (XANAX) 0.5 MG tablet   Oral   Take 0.5 mg by mouth every 8 (eight) hours as needed for anxiety.         Marland Kitchen amLODipine (NORVASC) 5 MG tablet   Oral   Take 1 tablet (5 mg total) by mouth daily.   30 tablet   1   . budesonide-formoterol (SYMBICORT) 160-4.5 MCG/ACT  inhaler   Inhalation   Inhale 2 puffs into the lungs 2 (two) times daily.         . furosemide (LASIX) 40 MG tablet   Oral   Take 40 mg by mouth daily.         Marland Kitchen ipratropium-albuterol (DUONEB) 0.5-2.5 (3) MG/3ML SOLN   Nebulization   Take 3 mLs by nebulization 4 (four) times daily as needed (for shortness of breath).         . lactulose (CHRONULAC) 10 GM/15ML solution   Oral   Take 45 mLs (30 g total) by mouth 2 (two) times daily. Patient taking differently: Take 10 g by mouth 2 (two) times daily.    240 mL   2   . meloxicam (MOBIC) 15 MG tablet   Oral   Take 1 tablet (15 mg total) by mouth daily.   30 tablet   0   . methadone (DOLOPHINE) 10 MG/ML solution   Oral   Take 20 mg by mouth every 8 (eight) hours.         . metoprolol (LOPRESSOR) 25 MG tablet   Oral   Take 1 tablet (25 mg total) by mouth 2 (two) times daily.   60 tablet   1   . Multiple Vitamins-Minerals (MULTIVITAMIN PO)   Oral   Take 1 tablet by mouth daily.         . pantoprazole (PROTONIX) 40 MG tablet   Oral  Take 1 tablet (40 mg total) by mouth daily.   30 tablet   1   . rifaximin (XIFAXAN) 550 MG TABS tablet   Oral   Take 1 tablet (550 mg total) by mouth 2 (two) times daily.   60 tablet   1   . spironolactone (ALDACTONE) 25 MG tablet   Oral   Take 25 mg by mouth daily.         Marland Kitchen tiotropium (SPIRIVA) 18 MCG inhalation capsule   Inhalation   Place 18 mcg into inhaler and inhale daily.           Allergies Review of patient's allergies indicates no known allergies.  Family History  Problem Relation Age of Onset  . Family history unknown: Yes    Social History Social History  Substance Use Topics  . Smoking status: Current Some Day Smoker  . Smokeless tobacco: None  . Alcohol Use: No    Review of Systems Constitutional: No fever/chills Eyes: No visual changes. ENT: No sore throat. Cardiovascular: Denies chest pain. Respiratory: Denies shortness of  breath. Gastrointestinal: No abdominal pain.  No nausea, no vomiting.  No diarrhea.  No constipation. Genitourinary: Negative for dysuria. Musculoskeletal: Negative for back pain. Endorses bilateral rib pain Skin: Negative for rash. Neurological: Negative for headaches, focal weakness or numbness.  10-point ROS otherwise negative.  ____________________________________________   PHYSICAL EXAM:  VITAL SIGNS: ED Triage Vitals  Enc Vitals Group     BP 03/08/15 1355 167/75 mmHg     Pulse Rate 03/08/15 1355 81     Resp 03/08/15 1355 18     Temp 03/08/15 1355 97.9 F (36.6 C)     Temp Source 03/08/15 1355 Oral     SpO2 03/08/15 1355 94 %     Weight 03/08/15 1355 155 lb (70.308 kg)     Height 03/08/15 1355 5\' 11"  (1.803 m)     Head Cir --      Peak Flow --      Pain Score 03/08/15 1356 8     Pain Loc --      Pain Edu? --      Excl. in [place]? --     Constitutional: Alert and oriented. Well appearing and in no acute distress. Eyes: Conjunctivae are normal. PERRL. EOMI. Head: Atraumatic. Nose: No congestion/rhinnorhea. Mouth/Throat: Mucous membranes are moist.  Oropharynx non-erythematous. Neck: No stridor.  No cervical spine tenderness to palpation. Cardiovascular: Normal rate, regular rhythm. Grossly normal heart sounds.  Good peripheral circulation. Respiratory: Normal respiratory effort.  No retractions. Lungs CTAB. No absent or decreased breath sounds. Gastrointestinal: Soft and nontender. No distention. No abdominal bruits. No CVA tenderness. Musculoskeletal: No lower extremity tenderness nor edema.  No joint effusions. No visible abnormality to rib cage but inspection. Patient is point tender to palpation over the seventh and eighth rib right side anterior section. No flail segments. No paradoxical chest wall movement. Neurologic:  Normal speech and language. No gross focal neurologic deficits are appreciated. No gait instability. Skin:  Skin is warm, dry and intact. No rash  noted. Psychiatric: Mood and affect are normal. Speech and behavior are normal.  ____________________________________________   LABS (all labs ordered are listed, but only abnormal results are displayed)  Labs Reviewed - No data to display ____________________________________________  EKG   ____________________________________________  RADIOLOGY  Chest x-ray Impression: Areas of scarring and chronic interstitial prominence. No edema or consolidation. No pneumothorax. No focal bony abnormality.  Edges were personally reviewed by myself. ____________________________________________  PROCEDURES  Procedure(s) performed: None  Critical Care performed: No   Medications  oxyCODONE-acetaminophen (PERCOCET/ROXICET) 5-325 MG per tablet 1 tablet (1 tablet Oral Given 03/08/15 1546)    ____________________________________________   INITIAL IMPRESSION / ASSESSMENT AND PLAN / ED COURSE  Pertinent labs & imaging results that were available during my care of the patient were reviewed by me and considered in my medical decision making (see chart for details).  Patient presents emergency department status post a fall complaining of bilateral rib pain that is worse on the right. Chest x-ray is negative for acute bony abnormality or pneumothorax. Patient's exam is positive for tenderness along rib cage. No abnormalities are identified. Patient is given pain medication in the emergency department. Patient is queried in the [place] controlled substance database is found to be on regularly scheduled methadone from pain clinic. Patient will be placed on anti-inflammatories but no narcotics. Patient is advised to follow up primary care for any symptoms persisting passes treatment course. Patient verbalizes understanding of diagnosis and treatment plan verbalizes compliance with same.    Discharge Medication List as of 03/08/2015  3:33 PM    START taking these medications   Details   meloxicam (MOBIC) 15 MG tablet Take 1 tablet (15 mg total) by mouth daily., Starting 03/08/2015, Until Discontinued, Print        ____________________________________________   FINAL CLINICAL IMPRESSION(S) / ED DIAGNOSES  Final diagnoses:  Rib contusion, left, initial encounter      Darletta Moll, PA-C 03/08/15 1557  Hinda Kehr, MD 03/09/15 [id number]
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
## 2015-03-08 NOTE — ED Notes (Signed)
Pt states that he tripped and fell down on ground due to wet grass. Pt c/o pain bilateral rib pain. Pt alert and oriented X4, active, cooperative, pt in NAD. RR even and unlabored, color WNL.

## 2015-07-18 ENCOUNTER — Other Ambulatory Visit: Payer: Self-pay | Admitting: Family Medicine

## 2015-07-18 ENCOUNTER — Other Ambulatory Visit: Payer: Self-pay | Admitting: Gastroenterology

## 2015-07-18 DIAGNOSIS — B182 Chronic viral hepatitis C: Secondary | ICD-10-CM

## 2015-07-18 DIAGNOSIS — N5089 Other specified disorders of the male genital organs: Secondary | ICD-10-CM

## 2015-07-20 ENCOUNTER — Other Ambulatory Visit: Payer: Self-pay | Admitting: Family Medicine

## 2015-07-20 DIAGNOSIS — N5089 Other specified disorders of the male genital organs: Secondary | ICD-10-CM

## 2015-07-21 ENCOUNTER — Ambulatory Visit
Admission: RE | Admit: 2015-07-21 | Discharge: 2015-07-21 | Disposition: A | Discharge status: Home / Self Care | Payer: Medicaid Other | Source: Ambulatory Visit | Attending: Family Medicine | Admitting: Family Medicine

## 2015-07-21 DIAGNOSIS — N5089 Other specified disorders of the male genital organs: Secondary | ICD-10-CM

## 2015-07-21 DIAGNOSIS — I861 Scrotal varices: Secondary | ICD-10-CM | POA: Insufficient documentation

## 2015-07-21 DIAGNOSIS — N509 Disorder of male genital organs, unspecified: Secondary | ICD-10-CM | POA: Diagnosis not present

## 2015-08-04 ENCOUNTER — Ambulatory Visit
Admission: RE | Admit: 2015-08-04 | Discharge: 2015-08-04 | Disposition: A | Discharge status: Home / Self Care | Payer: Medicaid Other | Source: Ambulatory Visit | Attending: Gastroenterology | Admitting: Gastroenterology

## 2015-08-04 DIAGNOSIS — K7460 Unspecified cirrhosis of liver: Secondary | ICD-10-CM | POA: Diagnosis not present

## 2015-08-04 DIAGNOSIS — B182 Chronic viral hepatitis C: Secondary | ICD-10-CM | POA: Diagnosis present

## 2015-08-04 DIAGNOSIS — R945 Abnormal results of liver function studies: Secondary | ICD-10-CM | POA: Diagnosis present

## 2015-08-04 DIAGNOSIS — I8500 Esophageal varices without bleeding: Secondary | ICD-10-CM | POA: Diagnosis not present

## 2015-08-04 MED ORDER — GADOBENATE DIMEGLUMINE 529 MG/ML IV SOLN
15.0000 mL | Freq: Once | INTRAVENOUS | Status: AC | PRN
  Administered 2015-08-04: 14 mL via INTRAVENOUS

## 2015-08-22 ENCOUNTER — Other Ambulatory Visit: Payer: Self-pay | Admitting: Gastroenterology

## 2015-08-22 DIAGNOSIS — K7460 Unspecified cirrhosis of liver: Secondary | ICD-10-CM

## 2015-10-16 ENCOUNTER — Other Ambulatory Visit: Payer: Self-pay | Admitting: Gastroenterology

## 2015-10-16 DIAGNOSIS — K7460 Unspecified cirrhosis of liver: Secondary | ICD-10-CM

## 2015-10-17 ENCOUNTER — Ambulatory Visit: Admission: RE | Admit: 2015-10-17 | Payer: Medicaid Other | Source: Ambulatory Visit

## 2015-11-07 ENCOUNTER — Ambulatory Visit
Admission: RE | Admit: 2015-11-07 | Discharge: 2015-11-07 | Disposition: A | Discharge status: Home / Self Care | Payer: Medicaid Other | Source: Ambulatory Visit | Attending: Gastroenterology | Admitting: Gastroenterology

## 2015-11-07 ENCOUNTER — Encounter
Admission: RE | Disposition: A | Discharge status: Home / Self Care | Payer: Self-pay | Source: Ambulatory Visit | Attending: Gastroenterology

## 2015-11-07 ENCOUNTER — Encounter: Payer: Self-pay | Admitting: *Deleted

## 2015-11-07 ENCOUNTER — Ambulatory Visit: Payer: Medicaid Other | Admitting: Anesthesiology

## 2015-11-07 DIAGNOSIS — K621 Rectal polyp: Secondary | ICD-10-CM | POA: Diagnosis not present

## 2015-11-07 DIAGNOSIS — D125 Benign neoplasm of sigmoid colon: Secondary | ICD-10-CM | POA: Diagnosis not present

## 2015-11-07 DIAGNOSIS — I10 Essential (primary) hypertension: Secondary | ICD-10-CM | POA: Diagnosis not present

## 2015-11-07 DIAGNOSIS — K5730 Diverticulosis of large intestine without perforation or abscess without bleeding: Secondary | ICD-10-CM | POA: Insufficient documentation

## 2015-11-07 DIAGNOSIS — J449 Chronic obstructive pulmonary disease, unspecified: Secondary | ICD-10-CM | POA: Diagnosis not present

## 2015-11-07 DIAGNOSIS — E119 Type 2 diabetes mellitus without complications: Secondary | ICD-10-CM | POA: Diagnosis not present

## 2015-11-07 DIAGNOSIS — D124 Benign neoplasm of descending colon: Secondary | ICD-10-CM | POA: Insufficient documentation

## 2015-11-07 DIAGNOSIS — Z1211 Encounter for screening for malignant neoplasm of colon: Secondary | ICD-10-CM | POA: Insufficient documentation

## 2015-11-07 HISTORY — PX: COLONOSCOPY WITH PROPOFOL: SHX5780

## 2015-11-07 LAB — PROTIME-INR
INR: 1.09
Prothrombin Time: 14.1 seconds (ref 11.4–15.2)

## 2015-11-07 LAB — CBC
HCT: 44.7 % (ref 40.0–52.0)
Hemoglobin: 15.6 g/dL (ref 13.0–18.0)
MCH: 33.9 pg (ref 26.0–34.0)
MCHC: 35 g/dL (ref 32.0–36.0)
MCV: 96.7 fL (ref 80.0–100.0)
PLATELETS: 123 10*3/uL — AB (ref 150–440)
RBC: 4.62 MIL/uL (ref 4.40–5.90)
RDW: 13.6 % (ref 11.5–14.5)
WBC: 8.2 10*3/uL (ref 3.8–10.6)

## 2015-11-07 SURGERY — COLONOSCOPY WITH PROPOFOL
Anesthesia: General

## 2015-11-07 MED ORDER — LIDOCAINE 2% (20 MG/ML) 5 ML SYRINGE
INTRAMUSCULAR | Status: DC | PRN
  Administered 2015-11-07: 30 mg via INTRAVENOUS

## 2015-11-07 MED ORDER — SODIUM CHLORIDE 0.9 % IV SOLN
INTRAVENOUS | Status: DC
  Administered 2015-11-07: 09:00:00 via INTRAVENOUS

## 2015-11-07 MED ORDER — PROPOFOL 500 MG/50ML IV EMUL
INTRAVENOUS | Status: DC | PRN
  Administered 2015-11-07: 100 ug/kg/min via INTRAVENOUS

## 2015-11-07 MED ORDER — SODIUM CHLORIDE 0.9 % IV SOLN
INTRAVENOUS | Status: DC
  Administered 2015-11-07: 1000 mL via INTRAVENOUS

## 2015-11-07 MED ORDER — PROPOFOL 10 MG/ML IV BOLUS
INTRAVENOUS | Status: DC | PRN
  Administered 2015-11-07: 20 mg via INTRAVENOUS
  Administered 2015-11-07: 80 mg via INTRAVENOUS

## 2015-11-07 MED ORDER — MIDAZOLAM HCL 5 MG/5ML IJ SOLN
INTRAMUSCULAR | Status: DC | PRN
  Administered 2015-11-07: 0.5 mg via INTRAVENOUS

## 2015-11-07 NOTE — Transfer of Care (Signed)
Immediate Anesthesia Transfer of Care Note  Patient: [name]  Procedure(s) Performed: Procedure(s): COLONOSCOPY WITH PROPOFOL (N/A)  Patient Location: PACU and Endoscopy Unit  Anesthesia Type:General  Level of Consciousness: sedated  Airway & Oxygen Therapy: Patient Spontanous Breathing and Patient connected to nasal cannula oxygen  Post-op Assessment: Report given to RN and Post -op Vital signs reviewed and stable  Post vital signs: Reviewed and stable  Last Vitals:  Vitals:   11/07/15 0810  BP: (!) 163/80  Pulse: 97  Resp: 20  Temp: 36.6 C    Last Pain:  Vitals:   11/07/15 0810  TempSrc: Tympanic         Complications: No apparent anesthesia complications
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
## 2015-11-07 NOTE — Op Note (Signed)
[facility] Patient Name: [name] Procedure Date: 11/07/2015 9:22 AM MRN: [id number] Account #: [id number] Date of Birth: September 23, 1955 Admit Type: Outpatient Age: 60 Room: [facility] ENDO ROOM 3 Gender: Male Note Status: Finalized Procedure:            Colonoscopy Indications:          Screening for colorectal malignant neoplasm, This is                        the patient's first colonoscopy Providers:            [name], MD Referring MD:         Dyke Maes. Mancheno Revelo (Referring MD) Medicines:            Monitored Anesthesia Care Complications:        No immediate complications. Procedure:            Pre-Anesthesia Assessment:                       - ASA Grade Assessment: III - A patient with severe                        systemic disease.                       After obtaining informed consent, the colonoscope was                        passed under direct vision. Throughout the procedure,                        the patient's blood pressure, pulse, and oxygen                        saturations were monitored continuously. The                        Colonoscope was introduced through the anus and                        advanced to the the cecum, identified by appendiceal                        orifice and ileocecal valve. The colonoscopy was                        performed without difficulty. The patient tolerated the                        procedure well. The quality of the bowel preparation                        was good. Findings:      A less than 1 mm polyp was found in the sigmoid colon. The polyp was       sessile. The polyp was removed with a cold biopsy forceps. Resection and       retrieval were complete.      A 2 mm polyp was found in the descending colon. The polyp was sessile.       The polyp was removed with a cold biopsy forceps.  Resection and       retrieval were complete.      A 2 mm polyp was found in the  rectum. The polyp was sessile. The polyp       was removed with a cold biopsy forceps. Resection and retrieval were       complete.      A 1 mm polypoid lesion was found at the anus. The lesion was sessile. No       bleeding was present. The polyp was removed with a cold biopsy forceps.       Resection and retrieval were complete.      The digital rectal exam was normal.      A few small-mouthed diverticula were found in the sigmoid colon and       descending colon. Impression:           - One less than 1 mm polyp in the sigmoid colon,                        removed with a cold biopsy forceps. Resected and                        retrieved.                       - One 2 mm polyp in the descending colon, removed with                        a cold biopsy forceps. Resected and retrieved.                       - One 2 mm polyp in the rectum, removed with a cold                        biopsy forceps. Resected and retrieved.                       - Polypoid lesion at the anus. Complete removal was                        accomplished.                       - Diverticulosis in the sigmoid colon and in the                        descending colon. Recommendation:       - Discharge patient to home.                       - Await pathology results.                       - Telephone GI clinic for pathology results in 1 week.                       - Return to GI clinic as previously scheduled. Procedure Code(s):    --- Professional ---                       [id number], Colonoscopy, flexible; with biopsy, single or  multiple Diagnosis Code(s):    --- Professional ---                       Z12.11, Encounter for screening for malignant neoplasm                        of colon                       D12.5, Benign neoplasm of sigmoid colon                       D12.4, Benign neoplasm of descending colon                       K62.1, Rectal polyp                       D49.0, Neoplasm of  unspecified behavior of digestive                        system                       K57.30, Diverticulosis of large intestine without                        perforation or abscess without bleeding CPT copyright 2016 American Medical Association. All rights reserved. The codes documented in this report are preliminary and upon coder review may  be revised to meet current compliance requirements. [name], MD 11/07/2015 9:57:16 AM This report has been signed electronically. Number of Addenda: 0 Note Initiated On: 11/07/2015 9:22 AM Scope Withdrawal Time: 0 hours 13 minutes 23 seconds  Total Procedure Duration: 0 hours 24 minutes 19 seconds       [facility]

## 2015-11-07 NOTE — Anesthesia Postprocedure Evaluation (Signed)
Anesthesia Post Note  Patient: [name]  Procedure(s) Performed: Procedure(s) (LRB): COLONOSCOPY WITH PROPOFOL (N/A)  Patient location during evaluation: PACU Anesthesia Type: General Level of consciousness: awake and alert Pain management: pain level controlled Vital Signs Assessment: post-procedure vital signs reviewed and stable Respiratory status: spontaneous breathing, nonlabored ventilation, respiratory function stable and patient connected to nasal cannula oxygen Cardiovascular status: blood pressure returned to baseline and stable Postop Assessment: no signs of nausea or vomiting Anesthetic complications: no    Last Vitals:  Vitals:   11/07/15 1020 11/07/15 1030  BP: (!) 156/108 (!) 159/79  Pulse: 80 81  Resp: 19 18  Temp:      Last Pain:  Vitals:   11/07/15 1002  TempSrc: Tympanic                 Molli Barrows
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
## 2015-11-08 ENCOUNTER — Encounter: Payer: Self-pay | Admitting: Gastroenterology

## 2015-11-08 LAB — SURGICAL PATHOLOGY

## 2016-01-10 ENCOUNTER — Ambulatory Visit: Payer: Medicaid Other | Admitting: Urology

## 2016-01-22 ENCOUNTER — Encounter: Payer: Self-pay | Admitting: Urology

## 2016-01-22 ENCOUNTER — Ambulatory Visit (INDEPENDENT_AMBULATORY_CARE_PROVIDER_SITE_OTHER): Payer: Medicaid Other | Admitting: Urology

## 2016-01-22 VITALS — BP 150/75 | HR 81 | Ht 70.5 in | Wt 159.3 lb

## 2016-01-22 DIAGNOSIS — R3911 Hesitancy of micturition: Secondary | ICD-10-CM

## 2016-01-22 DIAGNOSIS — R300 Dysuria: Secondary | ICD-10-CM

## 2016-01-22 DIAGNOSIS — I861 Scrotal varices: Secondary | ICD-10-CM | POA: Diagnosis not present

## 2016-01-22 LAB — BLADDER SCAN AMB NON-IMAGING: Scan Result: 159

## 2016-01-22 MED ORDER — TAMSULOSIN HCL 0.4 MG PO CAPS: 0.4000 mg | ORAL_CAPSULE | Freq: Every day | ORAL | 4 refills | Status: DC

## 2016-01-22 NOTE — Patient Instructions (Addendum)
Tamsulosin capsules  What is this medicine?  TAMSULOSIN (tam SOO loe sin) is used to treat enlargement of the prostate gland in men, a condition called benign prostatic hyperplasia or BPH. It is not for use in women. It works by relaxing muscles in the prostate and bladder neck. This improves urine flow and reduces BPH symptoms.  This medicine may be used for other purposes; ask your health care provider or pharmacist if you have questions.  What should I tell my health care provider before I take this medicine?  They need to know if you have any of the following conditions:  -advanced kidney disease  -advanced liver disease  -low blood pressure  -prostate cancer  -an unusual or allergic reaction to tamsulosin, sulfa drugs, other medicines, foods, dyes, or preservatives  -pregnant or trying to get pregnant  -breast-feeding  How should I use this medicine?  Take this medicine by mouth about 30 minutes after the same meal every day. Follow the directions on the prescription label. Swallow the capsules whole with a glass of water. Do not crush, chew, or open capsules. Do not take your medicine more often than directed. Do not stop taking your medicine unless your doctor tells you to.  Talk to your pediatrician regarding the use of this medicine in children. Special care may be needed.  Overdosage: If you think you have taken too much of this medicine contact a poison control center or emergency room at once.  NOTE: This medicine is only for you. Do not share this medicine with others.  What if I miss a dose?  If you miss a dose, take it as soon as you can. If it is almost time for your next dose, take only that dose. Do not take double or extra doses. If you stop taking your medicine for several days or more, ask your doctor or health care professional what dose you should start back on.  What may interact with this medicine?  -cimetidine  -fluoxetine  -ketoconazole  -medicines for erectile disfunction like sildenafil,  tadalafil, vardenafil  -medicines for high blood pressure  -other alpha-blockers like alfuzosin, doxazosin, phentolamine, phenoxybenzamine, prazosin, terazosin  -warfarin  This list may not describe all possible interactions. Give your health care provider a list of all the medicines, herbs, non-prescription drugs, or dietary supplements you use. Also tell them if you smoke, drink alcohol, or use illegal drugs. Some items may interact with your medicine.  What should I watch for while using this medicine?  Visit your doctor or health care professional for regular check ups. You will need lab work done before you start this medicine and regularly while you are taking it. Check your blood pressure as directed. Ask your health care professional what your blood pressure should be, and when you should contact him or her.  This medicine may make you feel dizzy or lightheaded. This is more likely to happen after the first dose, after an increase in dose, or during hot weather or exercise. Drinking alcohol and taking some medicines can make this worse. Do not drive, use machinery, or do anything that needs mental alertness until you know how this medicine affects you. Do not sit or stand up quickly. If you begin to feel dizzy, sit down until you feel better. These effects can decrease once your body adjusts to the medicine.  Contact your doctor or health care professional right away if you have an erection that lasts longer than 4 hours or if it   becomes painful. This may be a sign of a serious problem and must be treated right away to prevent permanent damage.  If you are thinking of having cataract surgery, tell your eye surgeon that you have taken this medicine.  What side effects may I notice from receiving this medicine?  Side effects that you should report to your doctor or health care professional as soon as possible:  -allergic reactions like skin rash or itching, hives, swelling of the lips, mouth, tongue, or  throat  -breathing problems  -change in vision  -feeling faint or lightheaded  -irregular heartbeat  -prolonged or painful erection  -weakness  Side effects that usually do not require medical attention (report to your doctor or health care professional if they continue or are bothersome):  -back pain  -change in sex drive or performance  -constipation, nausea or vomiting  -cough  -drowsy  -runny or stuffy nose  -trouble sleeping  This list may not describe all possible side effects. Call your doctor for medical advice about side effects. You may report side effects to FDA at 1-800-FDA-1088.  Where should I keep my medicine?  Keep out of the reach of children.  Store at room temperature between 15 and 30 degrees C (59 and 86 degrees F). Throw away any unused medicine after the expiration date.  NOTE: This sheet is a summary. It may not cover all possible information. If you have questions about this medicine, talk to your doctor, pharmacist, or health care provider.      2016, Elsevier/Gold Standard. (2012-02-26 14:11:34)
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
## 2016-01-29 ENCOUNTER — Telehealth: Payer: Self-pay

## 2016-01-29 NOTE — Telephone Encounter (Signed)
Pt called stating he took the tamsulosin x4 days without any improvement of symptoms. Pt described symptoms to be feeling as though he was drunk. Pt stated that he felt like he was going to pass out, not able to walk straight, and not able to drive a car. Please advise.

## 2016-01-29 NOTE — Telephone Encounter (Signed)
He needs to stop the tamsulosin.  We can try Cialis, but we would need the okay from his liver doctor as once a day dosing has not been studied in folks with liver failure.  He should also make sure to contact his liver doctor or go to the ED as this can be the signs of the liver affecting his brain.

## 2016-01-29 NOTE — Telephone Encounter (Signed)
Spoke with pt in reference to cialis. Made pt aware will need to ask liver physician if ok to take cialis. Pt stated he will have liver doctor call [facility].

## 2016-01-30 ENCOUNTER — Telehealth: Payer: Self-pay | Admitting: Urology

## 2016-01-30 ENCOUNTER — Telehealth: Payer: Self-pay

## 2016-01-30 NOTE — Telephone Encounter (Signed)
Pt sister, Marlowe Kays, called inquiring about a PSA. Marlowe Kays stated she requested Dr. Ladoris Gene to draw a PSA and he wouldn't and was not sure why Larene Beach didn't draw one. Please advise.

## 2016-01-30 NOTE — Telephone Encounter (Signed)
Pt called and has left several messages for Dr to call our office.  He said if we called the office, they would accept the call. [phone]  Dr Ladoris Gene  He is able to urinate, but it takes a long time and just dribbles.  He can't come into office today.  Please advise.

## 2016-01-31 NOTE — Telephone Encounter (Signed)
[name] does not meet the guidelines for PSA screening at this time.  If they would like to have further discussions concerning this issue, they are welcome to make an appointment.  Also, has the patient's urination improved?

## 2016-01-31 NOTE — Telephone Encounter (Signed)
LMOM

## 2016-02-05 NOTE — Telephone Encounter (Signed)
Pt called after hours line c/o only being able to urinate small amounts at a time. Spoke with pt in reference to urinating issues. Pt stated that he is able to urinate just not with a strong urinary stream. Made pt aware a call has been placed into PCP and waiting to hear back about which medications he can take. Pt voiced understanding.

## 2016-02-13 ENCOUNTER — Telehealth: Payer: Self-pay

## 2016-02-13 NOTE — Telephone Encounter (Signed)
Rhonda from GI at [facility] called stating GI physicians are not willing to management cialis 5mg , but medication is also contraindicated in pt's with liver disease. Suanne Marker then stated the decision is up to you and pt if you want pt to have medication.

## 2016-02-13 NOTE — Telephone Encounter (Signed)
Spoke to the patient's sister, Severiano Gilbert regarding the the Cialis and the suggestion to CIC.   Patient's sister does not think that the patient will be agreeable to CIC, but will speak to him about it.  She wants to inquire if there is an alternate medication to the Flomax and the Cialis that he could take.

## 2016-02-13 NOTE — Telephone Encounter (Signed)
Please let the patient know that the Cialis is not recommended in patient's with cirrhosis.  He will need to learn CIC.

## 2016-02-14 ENCOUNTER — Telehealth: Payer: Self-pay

## 2016-02-14 NOTE — Telephone Encounter (Signed)
There is not an alternative to tamsulosin or Cialis.  CIC or an indwelling foley/SPT is his only options at this time.

## 2016-02-14 NOTE — Telephone Encounter (Signed)
Spoke with pt in reference medications not being able to be used. Made pt aware will need to perform CIC or have an indwelling foley. Pt stated "I came to get fixed not prolong the problem."  Reinforced with pt that he is not able to take the medications to help resolve the problem due to cirrhosis. Pt then stated "I need to think about it to make a decision."

## 2016-02-14 NOTE — Telephone Encounter (Signed)
Pt sister, Severiano Gilbert, called stating she called Dr. Synetta Fail office who told her it would be ok for pt to take cialis. Reinforced with Marlowe Kays that nurse spoke with Suanne Marker who stated that Dr. Jacqulyn Liner stated it was contraindicated for pt to take cialis due to cirrhosis. Marlowe Kays then stated that she will just be finding another urology office. Marlowe Kays went on to state "yall didn't even draw a PSA test which is absurd!" Reinforced with Marlowe Kays why we did not draw a PSA test per Larene Beach. Marlowe Kays then stated "I dont like you or your practice and slammed the phone down."

## 2016-02-21 ENCOUNTER — Ambulatory Visit: Payer: Medicaid Other | Admitting: Urology

## 2016-02-27 NOTE — Telephone Encounter (Signed)
Per pt sister pt will no longer be seeking care at [facility].

## 2016-02-27 NOTE — Telephone Encounter (Signed)
Has patient made a decision regarding his urinary issues, CIC vs indwelling foley?

## 2016-03-29 ENCOUNTER — Encounter: Payer: Self-pay | Admitting: Emergency Medicine

## 2016-03-29 ENCOUNTER — Emergency Department
Admission: EM | Admit: 2016-03-29 | Discharge: 2016-03-29 | Disposition: A | Discharge status: Home / Self Care | Payer: Medicaid Other | Attending: Emergency Medicine | Admitting: Emergency Medicine

## 2016-03-29 ENCOUNTER — Emergency Department: Payer: Medicaid Other

## 2016-03-29 DIAGNOSIS — E119 Type 2 diabetes mellitus without complications: Secondary | ICD-10-CM | POA: Insufficient documentation

## 2016-03-29 DIAGNOSIS — N50819 Testicular pain, unspecified: Secondary | ICD-10-CM

## 2016-03-29 DIAGNOSIS — R339 Retention of urine, unspecified: Secondary | ICD-10-CM | POA: Diagnosis not present

## 2016-03-29 DIAGNOSIS — J449 Chronic obstructive pulmonary disease, unspecified: Secondary | ICD-10-CM | POA: Diagnosis not present

## 2016-03-29 DIAGNOSIS — I10 Essential (primary) hypertension: Secondary | ICD-10-CM | POA: Insufficient documentation

## 2016-03-29 DIAGNOSIS — F17200 Nicotine dependence, unspecified, uncomplicated: Secondary | ICD-10-CM | POA: Insufficient documentation

## 2016-03-29 DIAGNOSIS — N50812 Left testicular pain: Secondary | ICD-10-CM | POA: Diagnosis present

## 2016-03-29 DIAGNOSIS — Z79899 Other long term (current) drug therapy: Secondary | ICD-10-CM | POA: Insufficient documentation

## 2016-03-29 DIAGNOSIS — I861 Scrotal varices: Secondary | ICD-10-CM | POA: Diagnosis not present

## 2016-03-29 DIAGNOSIS — N5089 Other specified disorders of the male genital organs: Secondary | ICD-10-CM

## 2016-03-29 LAB — COMPREHENSIVE METABOLIC PANEL
ALBUMIN: 3.7 g/dL (ref 3.5–5.0)
ALT: 193 U/L — AB (ref 17–63)
AST: 233 U/L — AB (ref 15–41)
Alkaline Phosphatase: 136 U/L — ABNORMAL HIGH (ref 38–126)
Anion gap: 7 (ref 5–15)
BILIRUBIN TOTAL: 2.2 mg/dL — AB (ref 0.3–1.2)
BUN: 19 mg/dL (ref 6–20)
CO2: 24 mmol/L (ref 22–32)
CREATININE: 0.87 mg/dL (ref 0.61–1.24)
Calcium: 9.8 mg/dL (ref 8.9–10.3)
Chloride: 106 mmol/L (ref 101–111)
GFR calc Af Amer: >60 mL/min (ref >60)
GFR calc non Af Amer: >60 mL/min (ref >60)
GLUCOSE: 100 mg/dL — AB (ref 65–99)
POTASSIUM: 3.9 mmol/L (ref 3.5–5.1)
Sodium: 137 mmol/L (ref 135–145)
TOTAL PROTEIN: 8 g/dL (ref 6.5–8.1)

## 2016-03-29 LAB — URINALYSIS, COMPLETE (UACMP) WITH MICROSCOPIC
BACTERIA UA: NONE SEEN
Bilirubin Urine: NEGATIVE
Glucose, UA: NEGATIVE mg/dL
KETONES UR: NEGATIVE mg/dL
Leukocytes, UA: NEGATIVE
Nitrite: NEGATIVE
PROTEIN: NEGATIVE mg/dL
Specific Gravity, Urine: 1.02 (ref 1.005–1.030)
pH: 5 (ref 5.0–8.0)

## 2016-03-29 LAB — CBC WITH DIFFERENTIAL/PLATELET
BASOS ABS: 0.1 10*3/uL (ref 0–0.1)
BASOS PCT: 1 %
Eosinophils Absolute: 0.2 10*3/uL (ref 0–0.7)
Eosinophils Relative: 2 %
HEMATOCRIT: 43 % (ref 40.0–52.0)
HEMOGLOBIN: 14.7 g/dL (ref 13.0–18.0)
LYMPHS PCT: 21 %
Lymphs Abs: 1.8 10*3/uL (ref 1.0–3.6)
MCH: 33.5 pg (ref 26.0–34.0)
MCHC: 34.2 g/dL (ref 32.0–36.0)
MCV: 98.1 fL (ref 80.0–100.0)
MONO ABS: 1.1 10*3/uL — AB (ref 0.2–1.0)
Monocytes Relative: 13 %
NEUTROS ABS: 5.6 10*3/uL (ref 1.4–6.5)
NEUTROS PCT: 63 %
Platelets: 129 10*3/uL — ABNORMAL LOW (ref 150–440)
RBC: 4.38 MIL/uL — AB (ref 4.40–5.90)
RDW: 14 % (ref 11.5–14.5)
WBC: 8.8 10*3/uL (ref 3.8–10.6)

## 2016-03-29 MED ORDER — TRAMADOL HCL 50 MG PO TABS: 50.0000 mg | ORAL_TABLET | Freq: Four times a day (QID) | ORAL | 0 refills | Status: DC | PRN

## 2016-03-29 NOTE — ED Triage Notes (Signed)
Pt reports left scrotal pain x1 mth. Still able to pass urine but dribbles at times.

## 2016-03-29 NOTE — ED Notes (Addendum)
[phone] sister of patient.

## 2016-03-29 NOTE — ED Notes (Addendum)
219cc found in bladder upon bladder scan in triage.
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
## 2016-03-31 LAB — URINE CULTURE: SPECIAL REQUESTS: NORMAL

## 2016-07-15 ENCOUNTER — Emergency Department: Payer: Medicaid Other

## 2016-07-15 ENCOUNTER — Encounter: Payer: Self-pay | Admitting: Emergency Medicine

## 2016-07-15 ENCOUNTER — Emergency Department
Admission: EM | Admit: 2016-07-15 | Discharge: 2016-07-15 | Disposition: A | Discharge status: Home / Self Care | Payer: Medicaid Other | Attending: Emergency Medicine | Admitting: Emergency Medicine

## 2016-07-15 DIAGNOSIS — J441 Chronic obstructive pulmonary disease with (acute) exacerbation: Secondary | ICD-10-CM | POA: Insufficient documentation

## 2016-07-15 DIAGNOSIS — Z79899 Other long term (current) drug therapy: Secondary | ICD-10-CM | POA: Diagnosis not present

## 2016-07-15 DIAGNOSIS — E119 Type 2 diabetes mellitus without complications: Secondary | ICD-10-CM | POA: Insufficient documentation

## 2016-07-15 DIAGNOSIS — F17200 Nicotine dependence, unspecified, uncomplicated: Secondary | ICD-10-CM | POA: Insufficient documentation

## 2016-07-15 DIAGNOSIS — R0602 Shortness of breath: Secondary | ICD-10-CM | POA: Diagnosis present

## 2016-07-15 DIAGNOSIS — I10 Essential (primary) hypertension: Secondary | ICD-10-CM | POA: Insufficient documentation

## 2016-07-15 LAB — TROPONIN I

## 2016-07-15 LAB — BASIC METABOLIC PANEL
ANION GAP: 6 (ref 5–15)
BUN: 17 mg/dL (ref 6–20)
CALCIUM: 8.5 mg/dL — AB (ref 8.9–10.3)
CO2: 24 mmol/L (ref 22–32)
Chloride: 109 mmol/L (ref 101–111)
Creatinine, Ser: 0.96 mg/dL (ref 0.61–1.24)
Glucose, Bld: 198 mg/dL — ABNORMAL HIGH (ref 65–99)
Potassium: 3.4 mmol/L — ABNORMAL LOW (ref 3.5–5.1)
Sodium: 139 mmol/L (ref 135–145)

## 2016-07-15 LAB — CBC
HCT: 40.4 % (ref 40.0–52.0)
HEMOGLOBIN: 13.7 g/dL (ref 13.0–18.0)
MCH: 33.2 pg (ref 26.0–34.0)
MCHC: 34 g/dL (ref 32.0–36.0)
MCV: 97.4 fL (ref 80.0–100.0)
Platelets: 112 10*3/uL — ABNORMAL LOW (ref 150–440)
RBC: 4.14 MIL/uL — AB (ref 4.40–5.90)
RDW: 13.2 % (ref 11.5–14.5)
WBC: 7.3 10*3/uL (ref 3.8–10.6)

## 2016-07-15 MED ORDER — AZITHROMYCIN 250 MG PO TABS
500.0000 mg | ORAL_TABLET | Freq: Once | ORAL | Status: AC
  Administered 2016-07-15: 500 mg via ORAL
  Filled 2016-07-15: qty 2

## 2016-07-15 MED ORDER — PREDNISONE 20 MG PO TABS: 40.0000 mg | ORAL_TABLET | Freq: Every day | ORAL | 0 refills | Status: DC

## 2016-07-15 MED ORDER — GUAIFENESIN 100 MG/5ML PO SOLN: 5.0000 mL | ORAL | 0 refills | Status: DC | PRN

## 2016-07-15 MED ORDER — AZITHROMYCIN 250 MG PO TABS: ORAL_TABLET | ORAL | 0 refills | Status: DC

## 2016-07-15 MED ORDER — PREDNISONE 20 MG PO TABS
40.0000 mg | ORAL_TABLET | ORAL | Status: AC
  Administered 2016-07-15: 40 mg via ORAL
  Filled 2016-07-15: qty 2

## 2016-07-15 MED ORDER — ALBUTEROL SULFATE HFA 108 (90 BASE) MCG/ACT IN AERS: 2.0000 | INHALATION_SPRAY | RESPIRATORY_TRACT | 0 refills | Status: DC | PRN

## 2016-07-15 NOTE — ED Triage Notes (Signed)
Pt presents to ED c/o SOB x3 days with productive cough. Pt without wheezing, able to speak in full sentences

## 2016-07-15 NOTE — ED Notes (Signed)
Patient updated on plan of care.  This RN apologized for delays.
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
## 2016-07-31 ENCOUNTER — Ambulatory Visit
Admission: RE | Admit: 2016-07-31 | Discharge: 2016-07-31 | Disposition: A | Discharge status: Home / Self Care | Payer: Medicaid Other | Source: Ambulatory Visit | Attending: Family Medicine | Admitting: Family Medicine

## 2016-07-31 ENCOUNTER — Other Ambulatory Visit: Payer: Self-pay | Admitting: Family Medicine

## 2016-07-31 DIAGNOSIS — M79602 Pain in left arm: Secondary | ICD-10-CM | POA: Diagnosis present

## 2016-07-31 DIAGNOSIS — S6992XA Unspecified injury of left wrist, hand and finger(s), initial encounter: Secondary | ICD-10-CM

## 2016-07-31 DIAGNOSIS — M7989 Other specified soft tissue disorders: Secondary | ICD-10-CM | POA: Insufficient documentation

## 2016-07-31 DIAGNOSIS — S59912A Unspecified injury of left forearm, initial encounter: Secondary | ICD-10-CM

## 2016-07-31 DIAGNOSIS — M8588 Other specified disorders of bone density and structure, other site: Secondary | ICD-10-CM | POA: Insufficient documentation

## 2016-08-18 ENCOUNTER — Emergency Department
Admission: EM | Admit: 2016-08-18 | Discharge: 2016-08-18 | Disposition: A | Discharge status: Home / Self Care | Payer: Medicaid Other | Attending: Emergency Medicine | Admitting: Emergency Medicine

## 2016-08-18 ENCOUNTER — Encounter: Payer: Self-pay | Admitting: Emergency Medicine

## 2016-08-18 ENCOUNTER — Emergency Department: Payer: Medicaid Other

## 2016-08-18 DIAGNOSIS — J449 Chronic obstructive pulmonary disease, unspecified: Secondary | ICD-10-CM | POA: Insufficient documentation

## 2016-08-18 DIAGNOSIS — F17200 Nicotine dependence, unspecified, uncomplicated: Secondary | ICD-10-CM | POA: Diagnosis not present

## 2016-08-18 DIAGNOSIS — Y939 Activity, unspecified: Secondary | ICD-10-CM | POA: Diagnosis not present

## 2016-08-18 DIAGNOSIS — M7022 Olecranon bursitis, left elbow: Secondary | ICD-10-CM | POA: Insufficient documentation

## 2016-08-18 DIAGNOSIS — Z79899 Other long term (current) drug therapy: Secondary | ICD-10-CM | POA: Diagnosis not present

## 2016-08-18 DIAGNOSIS — I10 Essential (primary) hypertension: Secondary | ICD-10-CM | POA: Insufficient documentation

## 2016-08-18 DIAGNOSIS — E119 Type 2 diabetes mellitus without complications: Secondary | ICD-10-CM | POA: Diagnosis not present

## 2016-08-18 DIAGNOSIS — M129 Arthropathy, unspecified: Secondary | ICD-10-CM | POA: Diagnosis present

## 2016-08-18 LAB — COMPREHENSIVE METABOLIC PANEL
ALBUMIN: 3.7 g/dL (ref 3.5–5.0)
ALK PHOS: 167 U/L — AB (ref 38–126)
ALT: 137 U/L — AB (ref 17–63)
ANION GAP: 7 (ref 5–15)
AST: 116 U/L — ABNORMAL HIGH (ref 15–41)
BILIRUBIN TOTAL: 0.9 mg/dL (ref 0.3–1.2)
BUN: 19 mg/dL (ref 6–20)
CALCIUM: 9.4 mg/dL (ref 8.9–10.3)
CO2: 25 mmol/L (ref 22–32)
CREATININE: 0.88 mg/dL (ref 0.61–1.24)
Chloride: 104 mmol/L (ref 101–111)
GFR calc Af Amer: >60 mL/min (ref >60)
GFR calc non Af Amer: >60 mL/min (ref >60)
GLUCOSE: 162 mg/dL — AB (ref 65–99)
Potassium: 4.2 mmol/L (ref 3.5–5.1)
Sodium: 136 mmol/L (ref 135–145)
TOTAL PROTEIN: 7.7 g/dL (ref 6.5–8.1)

## 2016-08-18 LAB — CBC WITH DIFFERENTIAL/PLATELET
BASOS PCT: 1 %
Basophils Absolute: 0 10*3/uL (ref 0–0.1)
EOS ABS: 0.1 10*3/uL (ref 0–0.7)
EOS PCT: 1 %
HCT: 44 % (ref 40.0–52.0)
Hemoglobin: 15 g/dL (ref 13.0–18.0)
Lymphocytes Relative: 15 %
Lymphs Abs: 1.3 10*3/uL (ref 1.0–3.6)
MCH: 33.7 pg (ref 26.0–34.0)
MCHC: 34.1 g/dL (ref 32.0–36.0)
MCV: 98.6 fL (ref 80.0–100.0)
MONO ABS: 0.9 10*3/uL (ref 0.2–1.0)
MONOS PCT: 11 %
NEUTROS ABS: 6.3 10*3/uL (ref 1.4–6.5)
Neutrophils Relative %: 72 %
PLATELETS: 127 10*3/uL — AB (ref 150–440)
RBC: 4.47 MIL/uL (ref 4.40–5.90)
RDW: 13.6 % (ref 11.5–14.5)
WBC: 8.6 10*3/uL (ref 3.8–10.6)

## 2016-08-18 LAB — GLUCOSE, CAPILLARY: Glucose-Capillary: 171 mg/dL — ABNORMAL HIGH (ref 65–99)

## 2016-08-18 MED ORDER — AZITHROMYCIN 250 MG PO TABS: ORAL_TABLET | ORAL | 0 refills | Status: DC

## 2016-08-18 MED ORDER — DICLOFENAC SODIUM 75 MG PO TBEC: 75.0000 mg | DELAYED_RELEASE_TABLET | Freq: Two times a day (BID) | ORAL | 0 refills | Status: DC

## 2016-08-18 MED ORDER — BENZONATATE 100 MG PO CAPS: 100.0000 mg | ORAL_CAPSULE | Freq: Three times a day (TID) | ORAL | 0 refills | Status: DC | PRN

## 2016-08-18 NOTE — Discharge Instructions (Signed)
Keep the elbow protected with a soft wrap or elbow pad. Follow-up with [facility] as needed. Take the anti-inflammatory as directed. Continue to apply ice packs to reduce swelling.

## 2016-08-18 NOTE — ED Triage Notes (Signed)
Pt here for elbow swelling.  Left elbow noted to swollen and red.  Left elbow warm to the touch.  Pt just finished abx yesterday for this. Reports swelling worse.  No fevers. VSS.  NAD
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
## 2016-08-18 NOTE — ED Notes (Signed)

## 2016-08-18 NOTE — ED Notes (Signed)
Pt states that he went back to [facility] today to be evaluated for his left elbow, pt states that he has taken his antibiotics and cont to have swelling and pain to the left elbow, pt also states that his left forearm is painful and that he feels like there may be a "crack" in it when he rotates his arm. Pt is ambulatory to exam room without difficulty, no signs of distress noted, pt was sent to ER today from [facility].

## 2016-09-03 ENCOUNTER — Emergency Department: Payer: Medicaid Other

## 2016-09-03 ENCOUNTER — Inpatient Hospital Stay
Admission: EM | Admit: 2016-09-03 | Discharge: 2016-09-05 | DRG: 193 | Disposition: A | Discharge status: Home / Self Care | Payer: Medicaid Other | Attending: Internal Medicine | Admitting: Internal Medicine

## 2016-09-03 ENCOUNTER — Encounter: Payer: Self-pay | Admitting: Emergency Medicine

## 2016-09-03 DIAGNOSIS — J189 Pneumonia, unspecified organism: Secondary | ICD-10-CM | POA: Diagnosis present

## 2016-09-03 DIAGNOSIS — Z791 Long term (current) use of non-steroidal anti-inflammatories (NSAID): Secondary | ICD-10-CM | POA: Diagnosis not present

## 2016-09-03 DIAGNOSIS — K5900 Constipation, unspecified: Secondary | ICD-10-CM | POA: Diagnosis present

## 2016-09-03 DIAGNOSIS — R0602 Shortness of breath: Secondary | ICD-10-CM | POA: Diagnosis present

## 2016-09-03 DIAGNOSIS — G894 Chronic pain syndrome: Secondary | ICD-10-CM | POA: Diagnosis present

## 2016-09-03 DIAGNOSIS — R188 Other ascites: Secondary | ICD-10-CM

## 2016-09-03 DIAGNOSIS — J90 Pleural effusion, not elsewhere classified: Secondary | ICD-10-CM | POA: Diagnosis present

## 2016-09-03 DIAGNOSIS — M1990 Unspecified osteoarthritis, unspecified site: Secondary | ICD-10-CM | POA: Diagnosis present

## 2016-09-03 DIAGNOSIS — F419 Anxiety disorder, unspecified: Secondary | ICD-10-CM | POA: Diagnosis present

## 2016-09-03 DIAGNOSIS — F329 Major depressive disorder, single episode, unspecified: Secondary | ICD-10-CM | POA: Diagnosis present

## 2016-09-03 DIAGNOSIS — N400 Enlarged prostate without lower urinary tract symptoms: Secondary | ICD-10-CM | POA: Diagnosis present

## 2016-09-03 DIAGNOSIS — Z8042 Family history of malignant neoplasm of prostate: Secondary | ICD-10-CM | POA: Diagnosis not present

## 2016-09-03 DIAGNOSIS — Z8619 Personal history of other infectious and parasitic diseases: Secondary | ICD-10-CM | POA: Diagnosis not present

## 2016-09-03 DIAGNOSIS — Z7952 Long term (current) use of systemic steroids: Secondary | ICD-10-CM | POA: Diagnosis not present

## 2016-09-03 DIAGNOSIS — M7032 Other bursitis of elbow, left elbow: Secondary | ICD-10-CM | POA: Diagnosis present

## 2016-09-03 DIAGNOSIS — R778 Other specified abnormalities of plasma proteins: Secondary | ICD-10-CM

## 2016-09-03 DIAGNOSIS — Z79891 Long term (current) use of opiate analgesic: Secondary | ICD-10-CM | POA: Diagnosis not present

## 2016-09-03 DIAGNOSIS — E119 Type 2 diabetes mellitus without complications: Secondary | ICD-10-CM | POA: Diagnosis present

## 2016-09-03 DIAGNOSIS — R7989 Other specified abnormal findings of blood chemistry: Secondary | ICD-10-CM

## 2016-09-03 DIAGNOSIS — Y95 Nosocomial condition: Secondary | ICD-10-CM | POA: Diagnosis present

## 2016-09-03 DIAGNOSIS — Z841 Family history of disorders of kidney and ureter: Secondary | ICD-10-CM | POA: Diagnosis not present

## 2016-09-03 DIAGNOSIS — I10 Essential (primary) hypertension: Secondary | ICD-10-CM | POA: Diagnosis present

## 2016-09-03 DIAGNOSIS — J9601 Acute respiratory failure with hypoxia: Secondary | ICD-10-CM | POA: Diagnosis present

## 2016-09-03 DIAGNOSIS — F17200 Nicotine dependence, unspecified, uncomplicated: Secondary | ICD-10-CM | POA: Diagnosis present

## 2016-09-03 DIAGNOSIS — E876 Hypokalemia: Secondary | ICD-10-CM | POA: Diagnosis present

## 2016-09-03 DIAGNOSIS — J439 Emphysema, unspecified: Secondary | ICD-10-CM | POA: Diagnosis present

## 2016-09-03 DIAGNOSIS — J440 Chronic obstructive pulmonary disease with acute lower respiratory infection: Secondary | ICD-10-CM

## 2016-09-03 LAB — COMPREHENSIVE METABOLIC PANEL
ALBUMIN: 3 g/dL — AB (ref 3.5–5.0)
ALK PHOS: 163 U/L — AB (ref 38–126)
ALT: 96 U/L — AB (ref 17–63)
AST: 130 U/L — ABNORMAL HIGH (ref 15–41)
Anion gap: 6 (ref 5–15)
BILIRUBIN TOTAL: 1.1 mg/dL (ref 0.3–1.2)
BUN: 15 mg/dL (ref 6–20)
CALCIUM: 8.3 mg/dL — AB (ref 8.9–10.3)
CO2: 23 mmol/L (ref 22–32)
CREATININE: 0.77 mg/dL (ref 0.61–1.24)
Chloride: 109 mmol/L (ref 101–111)
GFR calc Af Amer: >60 mL/min (ref >60)
GFR calc non Af Amer: >60 mL/min (ref >60)
GLUCOSE: 134 mg/dL — AB (ref 65–99)
Potassium: 3.3 mmol/L — ABNORMAL LOW (ref 3.5–5.1)
SODIUM: 138 mmol/L (ref 135–145)
TOTAL PROTEIN: 6.9 g/dL (ref 6.5–8.1)

## 2016-09-03 LAB — CBC WITH DIFFERENTIAL/PLATELET
BASOS PCT: 1 %
Basophils Absolute: 0.1 10*3/uL (ref 0–0.1)
Eosinophils Absolute: 0.2 10*3/uL (ref 0–0.7)
Eosinophils Relative: 2 %
HEMATOCRIT: 39.8 % — AB (ref 40.0–52.0)
HEMOGLOBIN: 13.9 g/dL (ref 13.0–18.0)
LYMPHS ABS: 1.6 10*3/uL (ref 1.0–3.6)
Lymphocytes Relative: 17 %
MCH: 33.8 pg (ref 26.0–34.0)
MCHC: 35 g/dL (ref 32.0–36.0)
MCV: 96.7 fL (ref 80.0–100.0)
MONOS PCT: 10 %
Monocytes Absolute: 0.9 10*3/uL (ref 0.2–1.0)
NEUTROS ABS: 6.4 10*3/uL (ref 1.4–6.5)
NEUTROS PCT: 70 %
Platelets: 112 10*3/uL — ABNORMAL LOW (ref 150–440)
RBC: 4.12 MIL/uL — AB (ref 4.40–5.90)
RDW: 13.4 % (ref 11.5–14.5)
WBC: 9.1 10*3/uL (ref 3.8–10.6)

## 2016-09-03 LAB — TROPONIN I: TROPONIN I: 0.03 ng/mL — AB (ref <0.03)

## 2016-09-03 LAB — GLUCOSE, CAPILLARY: GLUCOSE-CAPILLARY: 186 mg/dL — AB (ref 65–99)

## 2016-09-03 LAB — INFLUENZA PANEL BY PCR (TYPE A & B)
INFLAPCR: NEGATIVE
INFLBPCR: NEGATIVE

## 2016-09-03 LAB — LIPASE, BLOOD: Lipase: 24 U/L (ref 11–51)

## 2016-09-03 LAB — PROCALCITONIN: Procalcitonin: 0.11 ng/mL

## 2016-09-03 LAB — MAGNESIUM: MAGNESIUM: 2.1 mg/dL (ref 1.7–2.4)

## 2016-09-03 LAB — BRAIN NATRIURETIC PEPTIDE: B Natriuretic Peptide: 49 pg/mL (ref 0.0–100.0)

## 2016-09-03 LAB — LACTIC ACID, PLASMA: Lactic Acid, Venous: 1.4 mmol/L (ref 0.5–1.9)

## 2016-09-03 MED ORDER — FUROSEMIDE 40 MG PO TABS
40.0000 mg | ORAL_TABLET | Freq: Every day | ORAL | Status: DC
  Administered 2016-09-04 – 2016-09-05 (×2): 40 mg via ORAL
  Filled 2016-09-03 (×2): qty 1

## 2016-09-03 MED ORDER — RIFAXIMIN 550 MG PO TABS
550.0000 mg | ORAL_TABLET | Freq: Two times a day (BID) | ORAL | Status: DC
  Administered 2016-09-03 – 2016-09-05 (×4): 550 mg via ORAL
  Filled 2016-09-03 (×5): qty 1

## 2016-09-03 MED ORDER — BENZONATATE 100 MG PO CAPS
100.0000 mg | ORAL_CAPSULE | Freq: Three times a day (TID) | ORAL | Status: DC | PRN
  Filled 2016-09-03 (×2): qty 1

## 2016-09-03 MED ORDER — DEXTROSE 5 % IV SOLN
2.0000 g | Freq: Once | INTRAVENOUS | Status: AC
  Administered 2016-09-03: 2 g via INTRAVENOUS
  Filled 2016-09-03: qty 2

## 2016-09-03 MED ORDER — IPRATROPIUM-ALBUTEROL 0.5-2.5 (3) MG/3ML IN SOLN
3.0000 mL | Freq: Once | RESPIRATORY_TRACT | Status: AC
  Administered 2016-09-03: 3 mL via RESPIRATORY_TRACT
  Filled 2016-09-03: qty 3

## 2016-09-03 MED ORDER — ONDANSETRON HCL 4 MG/2ML IJ SOLN
4.0000 mg | Freq: Four times a day (QID) | INTRAMUSCULAR | Status: DC | PRN
  Filled 2016-09-03: qty 2

## 2016-09-03 MED ORDER — METOPROLOL SUCCINATE ER 50 MG PO TB24
50.0000 mg | ORAL_TABLET | Freq: Every day | ORAL | Status: DC
  Administered 2016-09-04 – 2016-09-05 (×2): 50 mg via ORAL
  Filled 2016-09-03 (×2): qty 1

## 2016-09-03 MED ORDER — IPRATROPIUM-ALBUTEROL 0.5-2.5 (3) MG/3ML IN SOLN
3.0000 mL | Freq: Once | RESPIRATORY_TRACT | Status: AC
Start: 2016-09-03 — End: 2016-09-03
  Administered 2016-09-03: 3 mL via RESPIRATORY_TRACT

## 2016-09-03 MED ORDER — IPRATROPIUM-ALBUTEROL 0.5-2.5 (3) MG/3ML IN SOLN
3.0000 mL | Freq: Four times a day (QID) | RESPIRATORY_TRACT | Status: DC
  Administered 2016-09-04 – 2016-09-05 (×4): 3 mL via RESPIRATORY_TRACT
  Filled 2016-09-03 (×7): qty 3

## 2016-09-03 MED ORDER — VANCOMYCIN HCL IN DEXTROSE 1-5 GM/200ML-% IV SOLN
1000.0000 mg | Freq: Once | INTRAVENOUS | Status: AC
  Administered 2016-09-03: 1000 mg via INTRAVENOUS
  Filled 2016-09-03: qty 200

## 2016-09-03 MED ORDER — SODIUM CHLORIDE 0.9 % IV BOLUS (SEPSIS)
1000.0000 mL | Freq: Once | INTRAVENOUS | Status: AC
  Administered 2016-09-03: 1000 mL via INTRAVENOUS

## 2016-09-03 MED ORDER — ENOXAPARIN SODIUM 40 MG/0.4ML ~~LOC~~ SOLN
40.0000 mg | SUBCUTANEOUS | Status: DC
Start: 2016-09-03 — End: 2016-09-05
  Administered 2016-09-03 – 2016-09-04 (×2): 40 mg via SUBCUTANEOUS
  Filled 2016-09-03 (×3): qty 0.4

## 2016-09-03 MED ORDER — ALFUZOSIN HCL ER 10 MG PO TB24
10.0000 mg | ORAL_TABLET | Freq: Every day | ORAL | Status: DC
  Administered 2016-09-04 – 2016-09-05 (×2): 10 mg via ORAL
  Filled 2016-09-03 (×2): qty 1

## 2016-09-03 MED ORDER — METHYLPREDNISOLONE SODIUM SUCC 40 MG IJ SOLR
40.0000 mg | Freq: Four times a day (QID) | INTRAMUSCULAR | Status: DC
  Administered 2016-09-03 – 2016-09-04 (×4): 40 mg via INTRAVENOUS
  Filled 2016-09-03 (×6): qty 1

## 2016-09-03 MED ORDER — VANCOMYCIN HCL IN DEXTROSE 750-5 MG/150ML-% IV SOLN
750.0000 mg | Freq: Three times a day (TID) | INTRAVENOUS | Status: DC
  Administered 2016-09-04: 750 mg via INTRAVENOUS
  Filled 2016-09-03 (×3): qty 150

## 2016-09-03 MED ORDER — DICLOFENAC SODIUM 75 MG PO TBEC
75.0000 mg | DELAYED_RELEASE_TABLET | Freq: Two times a day (BID) | ORAL | Status: DC
  Administered 2016-09-03 – 2016-09-05 (×4): 75 mg via ORAL
  Filled 2016-09-03 (×6): qty 1

## 2016-09-03 MED ORDER — ONDANSETRON HCL 4 MG PO TABS
4.0000 mg | ORAL_TABLET | Freq: Four times a day (QID) | ORAL | Status: DC | PRN
  Filled 2016-09-03: qty 1

## 2016-09-03 MED ORDER — AMLODIPINE BESYLATE 5 MG PO TABS
5.0000 mg | ORAL_TABLET | Freq: Every day | ORAL | Status: DC
  Administered 2016-09-04 – 2016-09-05 (×2): 5 mg via ORAL
  Filled 2016-09-03 (×2): qty 1

## 2016-09-03 MED ORDER — BUDESONIDE 0.5 MG/2ML IN SUSP
0.5000 mg | Freq: Two times a day (BID) | RESPIRATORY_TRACT | Status: DC
  Administered 2016-09-04 – 2016-09-05 (×3): 0.5 mg via RESPIRATORY_TRACT
  Filled 2016-09-03 (×4): qty 2

## 2016-09-03 MED ORDER — ALPRAZOLAM 0.5 MG PO TABS
0.5000 mg | ORAL_TABLET | Freq: Three times a day (TID) | ORAL | Status: DC | PRN
  Administered 2016-09-04 – 2016-09-05 (×3): 0.5 mg via ORAL
  Filled 2016-09-03 (×3): qty 1

## 2016-09-03 MED ORDER — POTASSIUM CHLORIDE CRYS ER 20 MEQ PO TBCR
20.0000 meq | EXTENDED_RELEASE_TABLET | Freq: Two times a day (BID) | ORAL | Status: AC
  Administered 2016-09-03 – 2016-09-05 (×4): 20 meq via ORAL
  Filled 2016-09-03 (×5): qty 1

## 2016-09-03 MED ORDER — ACETAMINOPHEN 325 MG PO TABS
650.0000 mg | ORAL_TABLET | Freq: Four times a day (QID) | ORAL | Status: DC | PRN
  Filled 2016-09-03: qty 2

## 2016-09-03 MED ORDER — DEXTROSE 5 % IV SOLN
2.0000 g | Freq: Three times a day (TID) | INTRAVENOUS | Status: DC
  Administered 2016-09-04 – 2016-09-05 (×4): 2 g via INTRAVENOUS
  Filled 2016-09-03 (×8): qty 2

## 2016-09-03 MED ORDER — LACTULOSE 10 GM/15ML PO SOLN
10.0000 g | Freq: Two times a day (BID) | ORAL | Status: DC
  Administered 2016-09-03 – 2016-09-04 (×2): 10 g via ORAL
  Filled 2016-09-03 (×2): qty 30

## 2016-09-03 MED ORDER — IPRATROPIUM-ALBUTEROL 0.5-2.5 (3) MG/3ML IN SOLN
3.0000 mL | Freq: Once | RESPIRATORY_TRACT | Status: AC
Start: 2016-09-03 — End: 2016-09-03
  Administered 2016-09-03: 3 mL via RESPIRATORY_TRACT
  Filled 2016-09-03: qty 9

## 2016-09-03 MED ORDER — ACETAMINOPHEN 650 MG RE SUPP
650.0000 mg | Freq: Four times a day (QID) | RECTAL | Status: DC | PRN
  Filled 2016-09-03: qty 1

## 2016-09-03 MED ORDER — METHADONE HCL 10 MG PO TABS
20.0000 mg | ORAL_TABLET | Freq: Three times a day (TID) | ORAL | Status: DC
  Administered 2016-09-03 – 2016-09-05 (×6): 20 mg via ORAL
  Filled 2016-09-03 (×7): qty 2
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
## 2016-09-03 NOTE — Progress Notes (Signed)
Pharmacy Antibiotic Note  [name] is a 61 y.o. male admitted on 09/03/2016 with pneumonia.  Pharmacy has been consulted for Vancomycin and Cefepime dosing.  Per MD note-empirically treat him with nosocomial pneumonia as he's had recurrent ER visits.  Plan: Patient received Vancomycin 1 gramIV x1 in ER and Cefepime 2 grma IV x 1 in ER. Will continue with: Vancomycin 750 IV every 8 hours.  Goal trough 15-20 mcg/mL.  Ke 0.087,  T1/2 7.97,  Vd 50.8   (Scr 0.80) Will check trough on 6/28 at 1230.  MRSA PCR pending.  Will order Cefepime 2 gram IV q8h.      Height: 5\' 10"  (177.8 cm) Weight: 160 lb (72.6 kg) IBW/kg (Calculated) : 73  Temp (24hrs), Avg:98.2 F (36.8 C), Min:98.2 F (36.8 C), Max:98.2 F (36.8 C)   Recent Labs Lab 09/03/16 1822 09/03/16 1924  WBC 9.1  --   CREATININE 0.77  --   LATICACIDVEN  --  1.4    Estimated Creatinine Clearance: 99.6 mL/min (by C-G formula based on SCr of 0.77 mg/dL).    No Known Allergies  Antimicrobials this admission: Vanc 6/26 >>   Cefepime 6/26 >>    Dose adjustments this admission:    Microbiology results:  6/26 BCx:  Pending   UCx:       Sputum:      MRSA PCR:   CXR+ multi-focal PNA  Thank you for allowing pharmacy to be a part of this patient's care.  Shaena Parkerson A 09/03/2016 8:58 PM

## 2016-09-03 NOTE — ED Notes (Signed)
Trop 0.03 called to Dr. Karma Greaser

## 2016-09-03 NOTE — ED Triage Notes (Signed)
Presents with some SOB and prod cough  States phlegm is green in color  States he ran out of inhaler  Also denies any fever or pain

## 2016-09-04 ENCOUNTER — Inpatient Hospital Stay: Payer: Medicaid Other

## 2016-09-04 LAB — GLUCOSE, CAPILLARY: Glucose-Capillary: 194 mg/dL — ABNORMAL HIGH (ref 65–99)

## 2016-09-04 LAB — CBC
HEMATOCRIT: 38.1 % — AB (ref 40.0–52.0)
HEMOGLOBIN: 13 g/dL (ref 13.0–18.0)
MCH: 33.7 pg (ref 26.0–34.0)
MCHC: 34.2 g/dL (ref 32.0–36.0)
MCV: 98.7 fL (ref 80.0–100.0)
Platelets: 95 10*3/uL — ABNORMAL LOW (ref 150–440)
RBC: 3.86 MIL/uL — AB (ref 4.40–5.90)
RDW: 13.2 % (ref 11.5–14.5)
WBC: 7.2 10*3/uL (ref 3.8–10.6)

## 2016-09-04 LAB — BASIC METABOLIC PANEL
Anion gap: 6 (ref 5–15)
BUN: 17 mg/dL (ref 6–20)
CALCIUM: 8.1 mg/dL — AB (ref 8.9–10.3)
CO2: 22 mmol/L (ref 22–32)
Chloride: 105 mmol/L (ref 101–111)
Creatinine, Ser: 0.76 mg/dL (ref 0.61–1.24)
Glucose, Bld: 287 mg/dL — ABNORMAL HIGH (ref 65–99)
POTASSIUM: 4.1 mmol/L (ref 3.5–5.1)
Sodium: 133 mmol/L — ABNORMAL LOW (ref 135–145)

## 2016-09-04 LAB — MRSA PCR SCREENING: MRSA BY PCR: NEGATIVE

## 2016-09-04 MED ORDER — LACTULOSE 10 GM/15ML PO SOLN
30.0000 g | Freq: Four times a day (QID) | ORAL | Status: DC
  Administered 2016-09-04 (×3): 30 g via ORAL
  Filled 2016-09-04: qty 60
  Filled 2016-09-04 (×2): qty 45
  Filled 2016-09-04: qty 60
  Filled 2016-09-04: qty 45
  Filled 2016-09-04: qty 60

## 2016-09-04 MED ORDER — AZITHROMYCIN 500 MG PO TABS
500.0000 mg | ORAL_TABLET | Freq: Every day | ORAL | Status: AC
  Administered 2016-09-04: 10:00:00 500 mg via ORAL
  Filled 2016-09-04: qty 1

## 2016-09-04 MED ORDER — INSULIN ASPART 100 UNIT/ML ~~LOC~~ SOLN
0.0000 [IU] | Freq: Three times a day (TID) | SUBCUTANEOUS | Status: DC
  Administered 2016-09-05: 2 [IU] via SUBCUTANEOUS
  Administered 2016-09-05: 1 [IU] via SUBCUTANEOUS
  Filled 2016-09-04 (×2): qty 1

## 2016-09-04 MED ORDER — METHYLPREDNISOLONE SODIUM SUCC 40 MG IJ SOLR
40.0000 mg | Freq: Two times a day (BID) | INTRAMUSCULAR | Status: DC
  Administered 2016-09-05: 04:00:00 40 mg via INTRAVENOUS
  Filled 2016-09-04: qty 1

## 2016-09-04 MED ORDER — AZITHROMYCIN 250 MG PO TABS
250.0000 mg | ORAL_TABLET | Freq: Every day | ORAL | Status: DC
  Administered 2016-09-05: 08:00:00 250 mg via ORAL
  Filled 2016-09-04: qty 1

## 2016-09-04 MED ORDER — INSULIN ASPART 100 UNIT/ML ~~LOC~~ SOLN: 0.0000 [IU] | Freq: Every day | SUBCUTANEOUS | Status: DC

## 2016-09-04 NOTE — Progress Notes (Signed)
Pt complains of R mass on Upper Right Abdominal from work incident, tender when touched. States it is from muscle that has torn away from ribs, has been told there is nothing they can do about it and that's why he is on pain medicine. Will notify primary nurse. Will continue to monitor.

## 2016-09-04 NOTE — Progress Notes (Signed)
Patient ID: [name], male   DOB: Jul 24, 1955, 61 y.o.   MRN: [id number]  [employer] PROGRESS NOTE  [name] HFW:[id number] DOB: 09/04/1955 DOA: 09/03/2016 PCP: Tommy Burrow, MD  HPI/Subjective: Patient came in with shortness of breath and cough which was getting worse and found to have pneumonia in the ER. He states his breathing is better today than yesterday. Is concerned about his elbow swelling and pain and redness there. He also states that he's been having trouble urinating and trouble with bowel movements.  Objective: Vitals:   09/04/16 0737 09/04/16 1437  BP: (!) 149/71 134/75  Pulse: (!) 105 95  Resp: 12   Temp: 98.2 F (36.8 C) 97.7 F (36.5 C)    Filed Weights   09/03/16 1807 09/03/16 2151  Weight: 72.6 kg (160 lb) 72.7 kg (160 lb 3.2 oz)    ROS: Review of Systems  Constitutional: Negative for chills and fever.  Eyes: Negative for blurred vision.  Respiratory: Positive for cough, shortness of breath and wheezing.   Cardiovascular: Negative for chest pain.  Gastrointestinal: Positive for abdominal pain and constipation. Negative for diarrhea, nausea and vomiting.  Genitourinary: Negative for dysuria.  Musculoskeletal: Negative for joint pain.  Neurological: Negative for dizziness and headaches.   Exam: Physical Exam  Constitutional: He is oriented to person, place, and time.  HENT:  Nose: No mucosal edema.  Mouth/Throat: No oropharyngeal exudate or posterior oropharyngeal edema.  Eyes: Conjunctivae, EOM and lids are normal. Pupils are equal, round, and reactive to light.  Neck: No JVD present. Carotid bruit is not present. No edema present. No thyroid mass and no thyromegaly present.  Cardiovascular: S1 normal and S2 normal.  Exam reveals no gallop.   No murmur heard. Pulses:      Dorsalis pedis pulses are 2+ on the right side, and 2+ on the left side.  Respiratory: No respiratory distress. He has decreased breath sounds in the  right lower field and the left lower field. He has no wheezes. He has no rhonchi. He has no rales.  GI: Soft. Bowel sounds are normal. He exhibits distension. There is no tenderness.  Musculoskeletal:       Right ankle: He exhibits no swelling.       Left ankle: He exhibits no swelling.  Lymphadenopathy:    He has no cervical adenopathy.  Neurological: He is alert and oriented to person, place, and time. No cranial nerve deficit.  Skin: Skin is warm. Nails show no clubbing.  Left elbow bursa inflamed swollen and red  Psychiatric: He has a normal mood and affect.      Data Reviewed: Basic Metabolic Panel:  Recent Labs Lab 09/03/16 1822 09/04/16 0355  NA 138 133*  K 3.3* 4.1  CL 109 105  CO2 23 22  GLUCOSE 134* 287*  BUN 15 17  CREATININE 0.77 0.76  CALCIUM 8.3* 8.1*  MG 2.1  --    Liver Function Tests:  Recent Labs Lab 09/03/16 1822  AST 130*  ALT 96*  ALKPHOS 163*  BILITOT 1.1  PROT 6.9  ALBUMIN 3.0*    Recent Labs Lab 09/03/16 1822  LIPASE 24   CBC:  Recent Labs Lab 09/03/16 1822 09/04/16 0355  WBC 9.1 7.2  NEUTROABS 6.4  --   HGB 13.9 13.0  HCT 39.8* 38.1*  MCV 96.7 98.7  PLT 112* 95*   Cardiac Enzymes:  Recent Labs Lab 09/03/16 1822  TROPONINI 0.03*   BNP (last 3 results)  Recent Labs  09/03/16 1822  BNP 49.0    CBG:  Recent Labs Lab 09/03/16 2200  GLUCAP 186*    Recent Results (from the past 240 hour(s))  Blood Culture (routine x 2)     Status: None (Preliminary result)   Collection Time: 09/03/16  7:24 PM  Result Value Ref Range Status   Specimen Description BLOOD BLOOD RIGHT HAND  Final   Special Requests   Final    BOTTLES DRAWN AEROBIC AND ANAEROBIC Blood Culture adequate volume   Culture NO GROWTH < 12 HOURS  Final   Report Status PENDING  Incomplete  Blood Culture (routine x 2)     Status: None (Preliminary result)   Collection Time: 09/03/16  7:24 PM  Result Value Ref Range Status   Specimen Description BLOOD  BLOOD LEFT HAND  Final   Special Requests   Final    BOTTLES DRAWN AEROBIC AND ANAEROBIC Blood Culture results may not be optimal due to an excessive volume of blood received in culture bottles   Culture NO GROWTH < 12 HOURS  Final   Report Status PENDING  Incomplete  MRSA PCR Screening     Status: None   Collection Time: 09/03/16 10:22 PM  Result Value Ref Range Status   MRSA by PCR NEGATIVE NEGATIVE Final    Comment:        The GeneXpert MRSA Assay (FDA approved for NASAL specimens only), is one component of a comprehensive MRSA colonization surveillance program. It is not intended to diagnose MRSA infection nor to guide or monitor treatment for MRSA infections.      Studies: Dg Chest 2 View  Result Date: 09/03/2016 CLINICAL DATA:  COPD exacerbation with shortness of breath and productive cough. Former long-time smoker who recently quit. EXAM: CHEST  2 VIEW COMPARISON:  07/15/2016, 03/08/2015 and earlier. FINDINGS: Cardiac silhouette normal in size, unchanged. Thoracic aorta atherosclerotic, unchanged. Hilar and mediastinal contours otherwise unremarkable. Patchy airspace opacities throughout the right lung and an associated small to moderate-sized right pleural effusion, new since the May, 2018 exam. Emphysematous changes and parenchymal scarring involving both lungs. Left lung otherwise clear. No left pleural effusion. Visualized bony thorax intact. IMPRESSION: Pneumonia involving the right upper lobe, right middle lobe and to a lesser degree right lower lobe, superimposed upon COPD/emphysema. Associated small to moderate-sized right pleural effusion. Electronically Signed   By: Evangeline Dakin M.D.   On: 09/03/2016 18:53   US Abdomen Limited  Result Date: 09/04/2016 CLINICAL DATA:  Ascites. EXAM: LIMITED ABDOMEN ULTRASOUND FOR ASCITES TECHNIQUE: Limited ultrasound survey for ascites was performed in all four abdominal quadrants. COMPARISON:  None. FINDINGS: Trace amount of ascites  noted.  No focal abnormality identified. IMPRESSION: Trace amount of ascites noted. Electronically Signed   By: Marcello Moores  Register   On: 09/04/2016 14:03    Scheduled Meds: . alfuzosin  10 mg Oral Q breakfast  . amLODipine  5 mg Oral Daily  . [START ON 09/05/2016] azithromycin  250 mg Oral Daily  . budesonide (PULMICORT) nebulizer solution  0.5 mg Nebulization BID  . diclofenac  75 mg Oral BID  . enoxaparin (LOVENOX) injection  40 mg Subcutaneous Q24H  . furosemide  40 mg Oral Daily  . ipratropium-albuterol  3 mL Nebulization Q6H  . lactulose  30 g Oral QID  . methadone  20 mg Oral Q8H  . methylPREDNISolone (SOLU-MEDROL) injection  40 mg Intravenous Q6H  . metoprolol succinate  50 mg Oral Daily  . potassium chloride  20 mEq Oral  BID  . rifaximin  550 mg Oral BID   Continuous Infusions: . ceFEPime (MAXIPIME) IV Stopped (09/04/16 1526)    Assessment/Plan:  1. COPD exacerbation. Taper Solu-Medrol 40 mg IV every 12 hours. Continue nebulizer treatments with budesonide nebulizers and DuoNeb 2. Pneumonia right upper lobe. On Maxipime. Vancomycin discontinued with negative PCR. Added Zithromax 3. Constipation. Increase lactulose to 4 times a day 4. Trouble urinating asked the nurse to do a bladder scan 5. Liver cirrhosis with history of hepatitis C. Ultrasound abdomen to see if there is ascites 6. Chronic back pain on medication 7. Left elbow infected bursitis on antibiotics and steroids 8. Type 2 diabetes mellitus without complication. Place on sliding scale  Code Status:     Code Status Orders        Start     Ordered   09/03/16 2152  Full code  Continuous     09/03/16 2152    Code Status History    Date Active Date Inactive Code Status Order ID Comments User Context   11/06/2014  5:51 AM 11/07/2014  6:18 PM Full Code [id number]  Juluis Mire, MD Inpatient   08/10/2014  8:23 PM 08/12/2014  4:23 PM Full Code [id number]  Theodoro Grist, MD Inpatient     Disposition Plan: Home in  a couple days  Antibiotics:  Maxipime  Zithromax  Time spent: 30 minutes  Loletha Grayer  [employer]

## 2016-09-04 NOTE — Progress Notes (Signed)
Inpatient Diabetes Program Recommendations  AACE/ADA: New Consensus Statement on Inpatient Glycemic Control (2015)  Target Ranges:  Prepandial:   less than 140 mg/dL      Peak postprandial:   less than 180 mg/dL (1-2 hours)      Critically ill patients:  140 - 180 mg/dL   Results for CHARLIE, SEDA (MRN [id number]) as of 09/04/2016 11:28  Ref. Range 09/03/2016 18:22 09/04/2016 03:55  Glucose Latest Ref Range: 65 - 99 mg/dL 134 (H) 287 (H)   Review of Glycemic Control  Diabetes history: No Outpatient Diabetes medications: NA Current orders for Inpatient glycemic control: None  Inpatient Diabetes Program Recommendations: Correction (SSI): While inpatient and ordered steroids, please consider ordering CBGs with Novolog correction scale ACHS.  Thanks, Barnie Alderman, RN, MSN, CDE Diabetes Coordinator Inpatient Diabetes Program [phone] (Team Pager from 8am to 5pm)

## 2016-09-05 LAB — HIV ANTIBODY (ROUTINE TESTING W REFLEX): HIV SCREEN 4TH GENERATION: NONREACTIVE

## 2016-09-05 LAB — GLUCOSE, CAPILLARY
Glucose-Capillary: 133 mg/dL — ABNORMAL HIGH (ref 65–99)
Glucose-Capillary: 181 mg/dL — ABNORMAL HIGH (ref 65–99)

## 2016-09-05 MED ORDER — PREDNISONE 50 MG PO TABS: 50.0000 mg | ORAL_TABLET | Freq: Every day | ORAL | 0 refills | Status: DC

## 2016-09-05 MED ORDER — LEVOFLOXACIN 750 MG PO TABS: 750.0000 mg | ORAL_TABLET | Freq: Every day | ORAL | 0 refills | Status: DC

## 2016-09-05 MED ORDER — TRAMADOL HCL 50 MG PO TABS
50.0000 mg | ORAL_TABLET | Freq: Four times a day (QID) | ORAL | Status: DC | PRN
  Administered 2016-09-05: 08:00:00 50 mg via ORAL
  Filled 2016-09-05: qty 1

## 2016-09-05 MED ORDER — AMOXICILLIN-POT CLAVULANATE 875-125 MG PO TABS: 1.0000 | ORAL_TABLET | Freq: Two times a day (BID) | ORAL | 0 refills | Status: DC

## 2016-09-05 MED ORDER — BUDESONIDE-FORMOTEROL FUMARATE 160-4.5 MCG/ACT IN AERO
2.0000 | INHALATION_SPRAY | Freq: Two times a day (BID) | RESPIRATORY_TRACT | 0 refills | Status: AC
Start: 2016-09-05 — End: ?

## 2016-09-05 MED ORDER — TAMSULOSIN HCL 0.4 MG PO CAPS: 0.4000 mg | ORAL_CAPSULE | Freq: Every day | ORAL | 0 refills | Status: DC

## 2016-09-05 NOTE — Progress Notes (Signed)
Pt is being discharged home today. Discharge instructions reviewed with the patient, he verified understanding. 2 paper prescriptions were given to him. All belongings were packed and returned to the patient. His oxygen has been delivered to his room. He will be wheeled out in a wheelchair by staff.

## 2016-09-05 NOTE — Care Management (Signed)
Discharge to home today per Dr. Benjie Karvonen. Qualifies for home oxygen. Floydene Flock, [facility] representative updated.  Lives at home with his mother. Sees Dr. Alene Mires as primary care physician Shelbie Ammons RN MSN CCM Care Management [phone]

## 2016-09-05 NOTE — Progress Notes (Signed)
SATURATION QUALIFICATIONS: (This note is used to comply with regulatory documentation for home oxygen)  Patient Saturations on Room Air at Rest = 83%  Patient Saturations on Room Air while Ambulating = 80%  Patient Saturations on 2 Liters of oxygen while Ambulating = 93%  Please briefly explain why patient needs home oxygen: COPD

## 2016-09-05 NOTE — Progress Notes (Signed)
This was an unwitnessed fall. Patient called for help, when writer arrived to patient's room, he stated that he fell and hitted his buttock while in the process of trying to get to the bathroom. Patient denied pain, denied hitting head on any hard surface. Patient was re-educated on measures to prevent future falls. BSC offered.  Vitals signs checked. MD, [facility], and Charge Nurse Notified.  Fall huddle initiated. Patient kept safe and comfortable. Post fall risk. assessment completed.

## 2016-09-05 NOTE — Discharge Summary (Signed)
[place] [facility] [place] NAME: [name]    MR#:  [id number]  DATE OF BIRTH:  1955/03/23  DATE OF ADMISSION:  09/03/2016 ADMITTING PHYSICIAN: Henreitta Leber, MD  DATE OF DISCHARGE: 09/05/2016  PRIMARY CARE PHYSICIAN: Theotis Burrow, MD    ADMISSION DIAGNOSIS:  Healthcare-associated pneumonia [J18.9] Pleural effusion on right [J90] Chronic obstructive pulmonary disease with acute lower respiratory infection ([place]) [J44.0] Elevated troponin I level [R74.8]  DISCHARGE DIAGNOSIS:  Active Problems:   Pneumonia   SECONDARY DIAGNOSIS:   Past Medical History:  Diagnosis Date  . Anxiety unk  . Arthritis   . Chronic back pain unk  . COPD (chronic obstructive pulmonary disease) ([place])   . Depression   . Diabetes mellitus without complication ([place])   . Hep C w/o coma, chronic ([place])   . Hepatitis C, chronic ([place])   . Hepatitis C, chronic ([place])   . Hypertension     HOSPITAL COURSE:   61 year old male with past medical history of COPD, hepatitis C, hypertension who presents to the hospital due to shortness of breath, cough ongoing for the past week progressively getting worse.  1. Acute hypoxic respiratory failure in the setting of multifocal pneumonia and COPD exacerbation Patient will require oxygen at discharge  2. Acute COPD exacerbation due to pneumonia: Patient will be on prednisone 50 mg for 5 days. He has no wheezing on examination. He will continue with nebulizer and inhalers.  3. Nosocomial pneumonia/multifocal pneumonia: Patient was on cefepime and azithromycin He will be discharged on Levaquin and augmentin  4. Hypokalemia: This is repleted  5. Essential hypertension: Patient will continue Norvasc and metoprolol  6. Chronic pain syndrome: Continue methadone  7. History of hepatitis C: Continue lactulose, Xifaxan and Lasix  8. BPH: Continue Flomax  9. Left elbow bursitis which is improved  DISCHARGE  CONDITIONS AND DIET:   Stable  Regular diet  CONSULTS OBTAINED:    DRUG ALLERGIES:  No Known Allergies  DISCHARGE MEDICATIONS:   Current Discharge Medication List    START taking these medications   Details  amoxicillin-clavulanate (AUGMENTIN) 875-125 MG tablet Take 1 tablet by mouth 2 (two) times daily. Qty: 16 tablet, Refills: 0    levofloxacin (LEVAQUIN) 750 MG tablet Take 1 tablet (750 mg total) by mouth daily. Qty: 5 tablet, Refills: 0      CONTINUE these medications which have CHANGED   Details  budesonide-formoterol (SYMBICORT) 160-4.5 MCG/ACT inhaler Inhale 2 puffs into the lungs 2 (two) times daily. Qty: 1 Inhaler, Refills: 0    predniSONE (DELTASONE) 50 MG tablet Take 1 tablet (50 mg total) by mouth daily with breakfast. Qty: 5 tablet, Refills: 0    tamsulosin (FLOMAX) 0.4 MG CAPS capsule Take 1 capsule (0.4 mg total) by mouth daily. Qty: 30 capsule, Refills: 0      CONTINUE these medications which have NOT CHANGED   Details  albuterol (PROVENTIL HFA) 108 (90 Base) MCG/ACT inhaler Inhale 2 puffs into the lungs every 4 (four) hours as needed for wheezing or shortness of breath. Qty: 1 Inhaler, Refills: 0    alfuzosin (UROXATRAL) 10 MG 24 hr tablet Take 10 mg by mouth daily with breakfast.    ALPRAZolam (XANAX) 0.5 MG tablet Take 0.5 mg by mouth every 8 (eight) hours as needed for anxiety.    amLODipine (NORVASC) 5 MG tablet Take 1 tablet (5 mg total) by mouth daily. Qty: 30 tablet, Refills: 1    benzonatate (TESSALON PERLES)  100 MG capsule Take 1 capsule (100 mg total) by mouth 3 (three) times daily as needed for cough (Take 1-2 per dose). Qty: 30 capsule, Refills: 0    furosemide (LASIX) 40 MG tablet Take 40 mg by mouth daily.    ipratropium-albuterol (DUONEB) 0.5-2.5 (3) MG/3ML SOLN Take 3 mLs by nebulization 4 (four) times daily as needed (for shortness of breath).    lactulose (CHRONULAC) 10 GM/15ML solution Take 45 mLs (30 g total) by mouth 2  (two) times daily. Qty: 240 mL, Refills: 2    methadone (DOLOPHINE) 10 MG/ML solution Take 20 mg by mouth every 8 (eight) hours.    metoprolol succinate (TOPROL-XL) 50 MG 24 hr tablet Take 50 mg by mouth daily. Take with or immediately following a meal.    Multiple Vitamins-Minerals (MULTIVITAMIN PO) Take 1 tablet by mouth daily.    rifaximin (XIFAXAN) 550 MG TABS tablet Take 1 tablet (550 mg total) by mouth 2 (two) times daily. Qty: 60 tablet, Refills: 1    tiotropium (SPIRIVA) 18 MCG inhalation capsule Place 18 mcg into inhaler and inhale daily.    diclofenac (VOLTAREN) 75 MG EC tablet Take 1 tablet (75 mg total) by mouth 2 (two) times daily. Qty: 30 tablet, Refills: 0      STOP taking these medications     guaiFENesin (ROBITUSSIN) 100 MG/5ML SOLN      meloxicam (MOBIC) 15 MG tablet      metoprolol (LOPRESSOR) 25 MG tablet      pantoprazole (PROTONIX) 40 MG tablet      traMADol (ULTRAM) 50 MG tablet           Today   CHIEF COMPLAINT:  Vision doing well. Denies shortness of breath   VITAL SIGNS:  Blood pressure 132/82, pulse 80, temperature 97.6 F (36.4 C), temperature source Oral, resp. rate 16, height 5\' 10"  (1.778 m), weight 72.7 kg (160 lb 3.2 oz), SpO2 96 %.   REVIEW OF SYSTEMS:  Review of Systems  Constitutional: Negative.  Negative for chills, fever and malaise/fatigue.  HENT: Negative.  Negative for ear discharge, ear pain, hearing loss, nosebleeds and sore throat.   Eyes: Negative.  Negative for blurred vision and pain.  Respiratory: Negative.  Negative for cough, hemoptysis, shortness of breath and wheezing.   Cardiovascular: Negative.  Negative for chest pain, palpitations and leg swelling.  Gastrointestinal: Negative.  Negative for abdominal pain, blood in stool, diarrhea, nausea and vomiting.  Genitourinary: Negative.  Negative for dysuria.  Musculoskeletal: Negative.  Negative for back pain.  Skin: Negative.   Neurological: Negative for  dizziness, tremors, speech change, focal weakness, seizures and headaches.  Endo/Heme/Allergies: Negative.  Does not bruise/bleed easily.  Psychiatric/Behavioral: Negative.  Negative for depression, hallucinations and suicidal ideas.     PHYSICAL EXAMINATION:  GENERAL:  61 y.o.-year-old patient lying in the bed with no acute distress.  NECK:  Supple, no jugular venous distention. No thyroid enlargement, no tenderness.  LUNGS: Normal breath sounds bilaterally, no wheezing, rales,rhonchi  No use of accessory muscles of respiration.  CARDIOVASCULAR: S1, S2 normal. No murmurs, rubs, or gallops.  ABDOMEN: Soft, non-tender, non-distended. Bowel sounds present. No organomegaly or mass.  EXTREMITIES: No pedal edema, cyanosis, or clubbing.  Left elbow decreased swelling  PSYCHIATRIC: The patient is alert and oriented x 3.  SKIN: No obvious rash, lesion, or ulcer.   DATA REVIEW:   CBC  Recent Labs Lab 09/04/16 0355  WBC 7.2  HGB 13.0  HCT 38.1*  PLT 95*  Chemistries   Recent Labs Lab 09/03/16 1822 09/04/16 0355  NA 138 133*  K 3.3* 4.1  CL 109 105  CO2 23 22  GLUCOSE 134* 287*  BUN 15 17  CREATININE 0.77 0.76  CALCIUM 8.3* 8.1*  MG 2.1  --   AST 130*  --   ALT 96*  --   ALKPHOS 163*  --   BILITOT 1.1  --     Cardiac Enzymes  Recent Labs Lab 09/03/16 1822  TROPONINI 0.03*    Microbiology Results  @[email or web address] @  RADIOLOGY:  Dg Chest 2 View  Result Date: 09/03/2016 CLINICAL DATA:  COPD exacerbation with shortness of breath and productive cough. Former long-time smoker who recently quit. EXAM: CHEST  2 VIEW COMPARISON:  07/15/2016, 03/08/2015 and earlier. FINDINGS: Cardiac silhouette normal in size, unchanged. Thoracic aorta atherosclerotic, unchanged. Hilar and mediastinal contours otherwise unremarkable. Patchy airspace opacities throughout the right lung and an associated small to moderate-sized right pleural effusion, new since the May, 2018 exam.  Emphysematous changes and parenchymal scarring involving both lungs. Left lung otherwise clear. No left pleural effusion. Visualized bony thorax intact. IMPRESSION: Pneumonia involving the right upper lobe, right middle lobe and to a lesser degree right lower lobe, superimposed upon COPD/emphysema. Associated small to moderate-sized right pleural effusion. Electronically Signed   By: Evangeline Dakin M.D.   On: 09/03/2016 18:53   US Abdomen Limited  Result Date: 09/04/2016 CLINICAL DATA:  Ascites. EXAM: LIMITED ABDOMEN ULTRASOUND FOR ASCITES TECHNIQUE: Limited ultrasound survey for ascites was performed in all four abdominal quadrants. COMPARISON:  None. FINDINGS: Trace amount of ascites noted.  No focal abnormality identified. IMPRESSION: Trace amount of ascites noted. Electronically Signed   By: Marcello Moores  Register   On: 09/04/2016 14:03      Current Discharge Medication List    START taking these medications   Details  amoxicillin-clavulanate (AUGMENTIN) 875-125 MG tablet Take 1 tablet by mouth 2 (two) times daily. Qty: 16 tablet, Refills: 0    levofloxacin (LEVAQUIN) 750 MG tablet Take 1 tablet (750 mg total) by mouth daily. Qty: 5 tablet, Refills: 0      CONTINUE these medications which have CHANGED   Details  budesonide-formoterol (SYMBICORT) 160-4.5 MCG/ACT inhaler Inhale 2 puffs into the lungs 2 (two) times daily. Qty: 1 Inhaler, Refills: 0    predniSONE (DELTASONE) 50 MG tablet Take 1 tablet (50 mg total) by mouth daily with breakfast. Qty: 5 tablet, Refills: 0    tamsulosin (FLOMAX) 0.4 MG CAPS capsule Take 1 capsule (0.4 mg total) by mouth daily. Qty: 30 capsule, Refills: 0      CONTINUE these medications which have NOT CHANGED   Details  albuterol (PROVENTIL HFA) 108 (90 Base) MCG/ACT inhaler Inhale 2 puffs into the lungs every 4 (four) hours as needed for wheezing or shortness of breath. Qty: 1 Inhaler, Refills: 0    alfuzosin (UROXATRAL) 10 MG 24 hr tablet Take 10 mg  by mouth daily with breakfast.    ALPRAZolam (XANAX) 0.5 MG tablet Take 0.5 mg by mouth every 8 (eight) hours as needed for anxiety.    amLODipine (NORVASC) 5 MG tablet Take 1 tablet (5 mg total) by mouth daily. Qty: 30 tablet, Refills: 1    benzonatate (TESSALON PERLES) 100 MG capsule Take 1 capsule (100 mg total) by mouth 3 (three) times daily as needed for cough (Take 1-2 per dose). Qty: 30 capsule, Refills: 0    furosemide (LASIX) 40 MG tablet Take 40 mg by mouth daily.  ipratropium-albuterol (DUONEB) 0.5-2.5 (3) MG/3ML SOLN Take 3 mLs by nebulization 4 (four) times daily as needed (for shortness of breath).    lactulose (CHRONULAC) 10 GM/15ML solution Take 45 mLs (30 g total) by mouth 2 (two) times daily. Qty: 240 mL, Refills: 2    methadone (DOLOPHINE) 10 MG/ML solution Take 20 mg by mouth every 8 (eight) hours.    metoprolol succinate (TOPROL-XL) 50 MG 24 hr tablet Take 50 mg by mouth daily. Take with or immediately following a meal.    Multiple Vitamins-Minerals (MULTIVITAMIN PO) Take 1 tablet by mouth daily.    rifaximin (XIFAXAN) 550 MG TABS tablet Take 1 tablet (550 mg total) by mouth 2 (two) times daily. Qty: 60 tablet, Refills: 1    tiotropium (SPIRIVA) 18 MCG inhalation capsule Place 18 mcg into inhaler and inhale daily.    diclofenac (VOLTAREN) 75 MG EC tablet Take 1 tablet (75 mg total) by mouth 2 (two) times daily. Qty: 30 tablet, Refills: 0      STOP taking these medications     guaiFENesin (ROBITUSSIN) 100 MG/5ML SOLN      meloxicam (MOBIC) 15 MG tablet      metoprolol (LOPRESSOR) 25 MG tablet      pantoprazole (PROTONIX) 40 MG tablet      traMADol (ULTRAM) 50 MG tablet            Management plans discussed with the patient and he is in agreement. Stable for discharge home  Patient should follow up with pcp  CODE STATUS:     Code Status Orders        Start     Ordered   09/03/16 2152  Full code  Continuous     09/03/16 2152    Code  Status History    Date Active Date Inactive Code Status Order ID Comments User Context   11/06/2014  5:51 AM 11/07/2014  6:18 PM Full Code [id number]  Juluis Mire, MD Inpatient   08/10/2014  8:23 PM 08/12/2014  4:23 PM Full Code [id number]  Theodoro Grist, MD Inpatient      TOTAL TIME TAKING CARE OF THIS PATIENT: 3 minutes.    Note: This dictation was prepared with Dragon dictation along with smaller phrase technology. Any transcriptional errors that result from this process are unintentional.  Layali Freund M.D on 09/05/2016 at 12:05 PM  Between 7am to 6pm - Pager - [phone] After 6pm go to [email or web address] - password [email or web address] [facility]  Office  [phone]  CC: Primary care physician; Theotis Burrow, MD

## 2016-09-08 LAB — CULTURE, BLOOD (ROUTINE X 2)
CULTURE: NO GROWTH
Culture: NO GROWTH
SPECIAL REQUESTS: ADEQUATE

## 2016-09-09 ENCOUNTER — Encounter: Payer: Self-pay | Admitting: Emergency Medicine

## 2016-09-09 ENCOUNTER — Emergency Department: Payer: Medicaid Other

## 2016-09-09 ENCOUNTER — Other Ambulatory Visit: Payer: Self-pay

## 2016-09-09 ENCOUNTER — Inpatient Hospital Stay
Admission: EM | Admit: 2016-09-09 | Discharge: 2016-09-16 | DRG: 871 | Disposition: A | Discharge status: Home Health | Payer: Medicaid Other | Attending: Internal Medicine | Admitting: Internal Medicine

## 2016-09-09 DIAGNOSIS — Z9981 Dependence on supplemental oxygen: Secondary | ICD-10-CM

## 2016-09-09 DIAGNOSIS — R410 Disorientation, unspecified: Secondary | ICD-10-CM | POA: Diagnosis not present

## 2016-09-09 DIAGNOSIS — Y95 Nosocomial condition: Secondary | ICD-10-CM | POA: Diagnosis present

## 2016-09-09 DIAGNOSIS — R0789 Other chest pain: Secondary | ICD-10-CM

## 2016-09-09 DIAGNOSIS — W19XXXA Unspecified fall, initial encounter: Secondary | ICD-10-CM | POA: Diagnosis not present

## 2016-09-09 DIAGNOSIS — E876 Hypokalemia: Secondary | ICD-10-CM | POA: Diagnosis present

## 2016-09-09 DIAGNOSIS — B182 Chronic viral hepatitis C: Secondary | ICD-10-CM | POA: Diagnosis present

## 2016-09-09 DIAGNOSIS — J439 Emphysema, unspecified: Secondary | ICD-10-CM | POA: Diagnosis present

## 2016-09-09 DIAGNOSIS — A419 Sepsis, unspecified organism: Secondary | ICD-10-CM | POA: Diagnosis present

## 2016-09-09 DIAGNOSIS — R0781 Pleurodynia: Secondary | ICD-10-CM

## 2016-09-09 DIAGNOSIS — I700 Atherosclerosis of aorta: Secondary | ICD-10-CM | POA: Diagnosis present

## 2016-09-09 DIAGNOSIS — F329 Major depressive disorder, single episode, unspecified: Secondary | ICD-10-CM | POA: Diagnosis present

## 2016-09-09 DIAGNOSIS — Z87891 Personal history of nicotine dependence: Secondary | ICD-10-CM

## 2016-09-09 DIAGNOSIS — Z841 Family history of disorders of kidney and ureter: Secondary | ICD-10-CM | POA: Diagnosis not present

## 2016-09-09 DIAGNOSIS — Z8042 Family history of malignant neoplasm of prostate: Secondary | ICD-10-CM

## 2016-09-09 DIAGNOSIS — R4182 Altered mental status, unspecified: Secondary | ICD-10-CM

## 2016-09-09 DIAGNOSIS — Z9119 Patient's noncompliance with other medical treatment and regimen: Secondary | ICD-10-CM | POA: Diagnosis not present

## 2016-09-09 DIAGNOSIS — K8020 Calculus of gallbladder without cholecystitis without obstruction: Secondary | ICD-10-CM | POA: Diagnosis present

## 2016-09-09 DIAGNOSIS — G894 Chronic pain syndrome: Secondary | ICD-10-CM | POA: Diagnosis present

## 2016-09-09 DIAGNOSIS — Z7952 Long term (current) use of systemic steroids: Secondary | ICD-10-CM | POA: Diagnosis not present

## 2016-09-09 DIAGNOSIS — I10 Essential (primary) hypertension: Secondary | ICD-10-CM | POA: Diagnosis present

## 2016-09-09 DIAGNOSIS — K7460 Unspecified cirrhosis of liver: Secondary | ICD-10-CM | POA: Diagnosis present

## 2016-09-09 DIAGNOSIS — J188 Other pneumonia, unspecified organism: Secondary | ICD-10-CM | POA: Diagnosis not present

## 2016-09-09 DIAGNOSIS — R531 Weakness: Secondary | ICD-10-CM | POA: Diagnosis not present

## 2016-09-09 DIAGNOSIS — E119 Type 2 diabetes mellitus without complications: Secondary | ICD-10-CM | POA: Diagnosis present

## 2016-09-09 DIAGNOSIS — F419 Anxiety disorder, unspecified: Secondary | ICD-10-CM | POA: Diagnosis present

## 2016-09-09 DIAGNOSIS — M1990 Unspecified osteoarthritis, unspecified site: Secondary | ICD-10-CM | POA: Diagnosis present

## 2016-09-09 DIAGNOSIS — Z79891 Long term (current) use of opiate analgesic: Secondary | ICD-10-CM

## 2016-09-09 DIAGNOSIS — R188 Other ascites: Secondary | ICD-10-CM | POA: Diagnosis present

## 2016-09-09 DIAGNOSIS — J189 Pneumonia, unspecified organism: Secondary | ICD-10-CM

## 2016-09-09 HISTORY — DX: Unspecified asthma, uncomplicated: J45.909

## 2016-09-09 LAB — COMPREHENSIVE METABOLIC PANEL
ALBUMIN: 2.8 g/dL — AB (ref 3.5–5.0)
ALK PHOS: 154 U/L — AB (ref 38–126)
ALT: 102 U/L — AB (ref 17–63)
ANION GAP: 7 (ref 5–15)
AST: 111 U/L — ABNORMAL HIGH (ref 15–41)
BUN: 18 mg/dL (ref 6–20)
CALCIUM: 8.5 mg/dL — AB (ref 8.9–10.3)
CHLORIDE: 106 mmol/L (ref 101–111)
CO2: 26 mmol/L (ref 22–32)
Creatinine, Ser: 0.77 mg/dL (ref 0.61–1.24)
GFR calc Af Amer: >60 mL/min (ref >60)
GFR calc non Af Amer: >60 mL/min (ref >60)
GLUCOSE: 159 mg/dL — AB (ref 65–99)
Potassium: 3.6 mmol/L (ref 3.5–5.1)
SODIUM: 139 mmol/L (ref 135–145)
Total Bilirubin: 1.5 mg/dL — ABNORMAL HIGH (ref 0.3–1.2)
Total Protein: 6.4 g/dL — ABNORMAL LOW (ref 6.5–8.1)

## 2016-09-09 LAB — DIFFERENTIAL
BASOS PCT: 0 %
Basophils Absolute: 0 10*3/uL (ref 0–0.1)
Eosinophils Absolute: 0.2 10*3/uL (ref 0–0.7)
Eosinophils Relative: 2 %
LYMPHS ABS: 1.5 10*3/uL (ref 1.0–3.6)
Lymphocytes Relative: 21 %
MONO ABS: 1.1 10*3/uL — AB (ref 0.2–1.0)
MONOS PCT: 15 %
NEUTROS ABS: 4.5 10*3/uL (ref 1.4–6.5)
Neutrophils Relative %: 62 %

## 2016-09-09 LAB — CBC
HCT: 43.2 % (ref 40.0–52.0)
HEMOGLOBIN: 14.5 g/dL (ref 13.0–18.0)
MCH: 33.1 pg (ref 26.0–34.0)
MCHC: 33.7 g/dL (ref 32.0–36.0)
MCV: 98.4 fL (ref 80.0–100.0)
PLATELETS: 137 10*3/uL — AB (ref 150–440)
RBC: 4.39 MIL/uL — AB (ref 4.40–5.90)
RDW: 13.4 % (ref 11.5–14.5)
WBC: 7.3 10*3/uL (ref 3.8–10.6)

## 2016-09-09 LAB — AMMONIA: Ammonia: 26 umol/L (ref 9–35)

## 2016-09-09 LAB — LACTIC ACID, PLASMA
LACTIC ACID, VENOUS: 1.3 mmol/L (ref 0.5–1.9)
Lactic Acid, Venous: 3.5 mmol/L (ref 0.5–1.9)

## 2016-09-09 LAB — TROPONIN I: Troponin I: <0.03 ng/mL (ref <0.03)

## 2016-09-09 MED ORDER — LORAZEPAM 2 MG/ML IJ SOLN
2.0000 mg | Freq: Once | INTRAMUSCULAR | Status: AC
  Administered 2016-09-09: 2 mg via INTRAVENOUS

## 2016-09-09 MED ORDER — KETOROLAC TROMETHAMINE 15 MG/ML IJ SOLN: 15.0000 mg | Freq: Four times a day (QID) | INTRAMUSCULAR | Status: AC | PRN

## 2016-09-09 MED ORDER — ALPRAZOLAM 0.5 MG PO TABS
0.5000 mg | ORAL_TABLET | Freq: Three times a day (TID) | ORAL | Status: DC | PRN
  Administered 2016-09-14 – 2016-09-16 (×4): 0.5 mg via ORAL
  Filled 2016-09-09 (×4): qty 1

## 2016-09-09 MED ORDER — LACTULOSE 10 GM/15ML PO SOLN
30.0000 g | Freq: Two times a day (BID) | ORAL | Status: DC
  Administered 2016-09-10 (×2): 30 g via ORAL
  Administered 2016-09-12: 20 g via ORAL
  Administered 2016-09-13: 30 g via ORAL
  Administered 2016-09-14: 40 g via ORAL
  Administered 2016-09-15: 20 g via ORAL
  Administered 2016-09-15 – 2016-09-16 (×2): 30 g via ORAL
  Filled 2016-09-09 (×9): qty 60

## 2016-09-09 MED ORDER — IOPAMIDOL (ISOVUE-300) INJECTION 61%
100.0000 mL | Freq: Once | INTRAVENOUS | Status: AC | PRN
  Administered 2016-09-09: 100 mL via INTRAVENOUS

## 2016-09-09 MED ORDER — ACETAMINOPHEN 650 MG RE SUPP: 650.0000 mg | Freq: Four times a day (QID) | RECTAL | Status: DC | PRN

## 2016-09-09 MED ORDER — ALFUZOSIN HCL ER 10 MG PO TB24
10.0000 mg | ORAL_TABLET | Freq: Every day | ORAL | Status: DC
  Administered 2016-09-10 – 2016-09-16 (×7): 10 mg via ORAL
  Filled 2016-09-09 (×8): qty 1

## 2016-09-09 MED ORDER — PIPERACILLIN-TAZOBACTAM 3.375 G IVPB 30 MIN
3.3750 g | Freq: Once | INTRAVENOUS | Status: AC
  Administered 2016-09-09: 3.375 g via INTRAVENOUS
  Filled 2016-09-09: qty 50

## 2016-09-09 MED ORDER — METHADONE HCL 10 MG/ML PO CONC
20.0000 mg | Freq: Three times a day (TID) | ORAL | Status: DC
  Administered 2016-09-10 – 2016-09-16 (×21): 20 mg via ORAL
  Filled 2016-09-09 (×24): qty 2

## 2016-09-09 MED ORDER — TAMSULOSIN HCL 0.4 MG PO CAPS
0.4000 mg | ORAL_CAPSULE | Freq: Every day | ORAL | Status: DC
  Administered 2016-09-10 – 2016-09-16 (×7): 0.4 mg via ORAL
  Filled 2016-09-09 (×7): qty 1

## 2016-09-09 MED ORDER — BENZONATATE 100 MG PO CAPS
100.0000 mg | ORAL_CAPSULE | Freq: Three times a day (TID) | ORAL | Status: DC
  Administered 2016-09-10 – 2016-09-16 (×20): 100 mg via ORAL
  Filled 2016-09-09 (×21): qty 1

## 2016-09-09 MED ORDER — METOPROLOL SUCCINATE ER 50 MG PO TB24
50.0000 mg | ORAL_TABLET | Freq: Every day | ORAL | Status: DC
  Administered 2016-09-10 – 2016-09-16 (×7): 50 mg via ORAL
  Filled 2016-09-09 (×7): qty 1

## 2016-09-09 MED ORDER — ENOXAPARIN SODIUM 40 MG/0.4ML ~~LOC~~ SOLN
40.0000 mg | SUBCUTANEOUS | Status: DC
  Administered 2016-09-10 – 2016-09-15 (×7): 40 mg via SUBCUTANEOUS
  Filled 2016-09-09 (×7): qty 0.4

## 2016-09-09 MED ORDER — ACETAMINOPHEN 325 MG PO TABS: 650.0000 mg | ORAL_TABLET | Freq: Four times a day (QID) | ORAL | Status: DC | PRN

## 2016-09-09 MED ORDER — AMLODIPINE BESYLATE 5 MG PO TABS
5.0000 mg | ORAL_TABLET | Freq: Every day | ORAL | Status: DC
  Administered 2016-09-10 – 2016-09-16 (×7): 5 mg via ORAL
  Filled 2016-09-09 (×7): qty 1

## 2016-09-09 MED ORDER — MOMETASONE FURO-FORMOTEROL FUM 200-5 MCG/ACT IN AERO
2.0000 | INHALATION_SPRAY | Freq: Two times a day (BID) | RESPIRATORY_TRACT | Status: DC
  Administered 2016-09-10 – 2016-09-16 (×14): 2 via RESPIRATORY_TRACT
  Filled 2016-09-09: qty 8.8

## 2016-09-09 MED ORDER — IPRATROPIUM-ALBUTEROL 0.5-2.5 (3) MG/3ML IN SOLN
3.0000 mL | Freq: Four times a day (QID) | RESPIRATORY_TRACT | Status: DC
  Administered 2016-09-10 – 2016-09-14 (×17): 3 mL via RESPIRATORY_TRACT
  Filled 2016-09-09 (×18): qty 3

## 2016-09-09 MED ORDER — RIFAXIMIN 550 MG PO TABS
550.0000 mg | ORAL_TABLET | Freq: Two times a day (BID) | ORAL | Status: DC
  Administered 2016-09-10 – 2016-09-16 (×14): 550 mg via ORAL
  Filled 2016-09-09 (×15): qty 1

## 2016-09-09 MED ORDER — ONDANSETRON HCL 4 MG PO TABS: 4.0000 mg | ORAL_TABLET | Freq: Four times a day (QID) | ORAL | Status: DC | PRN

## 2016-09-09 MED ORDER — LORAZEPAM 2 MG/ML IJ SOLN
INTRAMUSCULAR | Status: AC
  Administered 2016-09-09: 2 mg via INTRAVENOUS
  Filled 2016-09-09: qty 1

## 2016-09-09 MED ORDER — HYDROCODONE-ACETAMINOPHEN 5-325 MG PO TABS
1.0000 | ORAL_TABLET | ORAL | Status: DC | PRN
  Administered 2016-09-14 – 2016-09-15 (×5): 1 via ORAL
  Filled 2016-09-09 (×7): qty 1

## 2016-09-09 MED ORDER — ORAL CARE MOUTH RINSE
15.0000 mL | Freq: Two times a day (BID) | OROMUCOSAL | Status: DC
  Administered 2016-09-10 – 2016-09-15 (×6): 15 mL via OROMUCOSAL

## 2016-09-09 MED ORDER — ONDANSETRON HCL 4 MG/2ML IJ SOLN: 4.0000 mg | Freq: Four times a day (QID) | INTRAMUSCULAR | Status: DC | PRN

## 2016-09-09 NOTE — ED Triage Notes (Addendum)
Pt in via EMS with two falls over the last two days.  Pt with bruising to right and left head/face and RLQ.  Per EMS, pt's mother states "he hasn't been acting right."  Pt A/Ox3 upon arrival, on 2L nasal cannula upon arrival.  Pt only complaint is RUQ abdominal pain.  Vitals WDL.  NAD noted at this time.

## 2016-09-09 NOTE — ED Notes (Signed)
This RN called by CT, notified that pt having seizure like activity in CT.  EDP notified and VO received for ativan.  Pt and EDP heading to CT to assess patient at this time
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
## 2016-09-09 NOTE — ED Notes (Signed)
Date and time results received: 09/09/16 8:21 PM  Test: Lactic Acid Critical Value: 3.5  Name of Provider Notified: Dr. Cinda Quest  Orders Received? Or Actions Taken?: Acknowledged
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
## 2016-09-09 NOTE — ED Notes (Signed)
TTS arrived to assess.  TTS spoke with Dr. Cinda Quest and was informed that the assessment was not needed, but generated due to CIWA protocol.

## 2016-09-10 LAB — BASIC METABOLIC PANEL
ANION GAP: 3 — AB (ref 5–15)
BUN: 16 mg/dL (ref 6–20)
CHLORIDE: 106 mmol/L (ref 101–111)
CO2: 29 mmol/L (ref 22–32)
Calcium: 7.9 mg/dL — ABNORMAL LOW (ref 8.9–10.3)
Creatinine, Ser: 0.67 mg/dL (ref 0.61–1.24)
GFR calc Af Amer: >60 mL/min (ref >60)
GLUCOSE: 105 mg/dL — AB (ref 65–99)
POTASSIUM: 3.2 mmol/L — AB (ref 3.5–5.1)
Sodium: 138 mmol/L (ref 135–145)

## 2016-09-10 LAB — CBC
HEMATOCRIT: 37.8 % — AB (ref 40.0–52.0)
HEMOGLOBIN: 13 g/dL (ref 13.0–18.0)
MCH: 33.9 pg (ref 26.0–34.0)
MCHC: 34.5 g/dL (ref 32.0–36.0)
MCV: 98.3 fL (ref 80.0–100.0)
Platelets: 117 10*3/uL — ABNORMAL LOW (ref 150–440)
RBC: 3.84 MIL/uL — ABNORMAL LOW (ref 4.40–5.90)
RDW: 13.6 % (ref 11.5–14.5)
WBC: 6.4 10*3/uL (ref 3.8–10.6)

## 2016-09-10 MED ORDER — VANCOMYCIN HCL IN DEXTROSE 1-5 GM/200ML-% IV SOLN
1000.0000 mg | Freq: Once | INTRAVENOUS | Status: AC
  Administered 2016-09-10: 1000 mg via INTRAVENOUS
  Filled 2016-09-10: qty 200

## 2016-09-10 MED ORDER — VANCOMYCIN HCL IN DEXTROSE 750-5 MG/150ML-% IV SOLN
750.0000 mg | Freq: Three times a day (TID) | INTRAVENOUS | Status: DC
  Administered 2016-09-10 – 2016-09-12 (×7): 750 mg via INTRAVENOUS
  Filled 2016-09-10 (×9): qty 150

## 2016-09-10 MED ORDER — DEXTROSE 5 % IV SOLN
1.0000 g | Freq: Three times a day (TID) | INTRAVENOUS | Status: DC
  Administered 2016-09-10 – 2016-09-16 (×21): 1 g via INTRAVENOUS
  Filled 2016-09-10 (×23): qty 1

## 2016-09-10 NOTE — Progress Notes (Signed)
PT Cancellation Note  Patient Details Name: [name] MRN: [id number] DOB: 05-30-1955   Cancelled Treatment:    Reason Eval/Treat Not Completed: Patient declined, no reason specified; Pt refused PT eval secondary to pain and fatigue, nursing aware of pt's c/o pain. Will attempt PT eval at a future date/time as appropriate.    Linus Salmons PT, DPT 09/10/16, 11:34 AM

## 2016-09-10 NOTE — Progress Notes (Signed)
Pharmacy Antibiotic Note  [name] is a 61 y.o. male admitted on 09/09/2016 with sepsis.  Pharmacy has been consulted for vanc/cefepime dosing.  Plan: Will give patient vanc 1g and cefepime 1g IV x 1   Will follow up w/ vanc 750 mg q8h w/ 6 hour stack dose. Will draw vanc trough 7/4 @ 0600 prior to 4th dose. Ke 0.0847 T1/2 8 hours  Will start cefepime 1g IV q8h   Height: 5\' 10"  (177.8 cm) Weight: 155 lb 11.2 oz (70.6 kg) IBW/kg (Calculated) : 73  Temp (24hrs), Avg:98.3 F (36.8 C), Min:98.1 F (36.7 C), Max:98.4 F (36.9 C)   Recent Labs Lab 09/03/16 1822 09/03/16 1924 09/04/16 0355 09/09/16 1934 09/09/16 2247  WBC 9.1  --  7.2 7.3  --   CREATININE 0.77  --  0.76 0.77  --   LATICACIDVEN  --  1.4  --  3.5* 1.3    Estimated Creatinine Clearance: 96.8 mL/min (by C-G formula based on SCr of 0.77 mg/dL).    No Known Allergies   Thank you for allowing pharmacy to be a part of this patient's care.  Tobie Lords, PharmD, BCPS Clinical Pharmacist 09/10/2016

## 2016-09-10 NOTE — Progress Notes (Signed)
[facility] [place] NAME: [name]    MR#:  [id number]  DATE OF BIRTH:  October 09, 1955  SUBJECTIVE: Admitted for multiple falls, possible seizure at home, found to have pneumonia. Mother says that he had a seizure and seizure caused him to fall. No tongue biting, mother noticed that he was shaking and have bruises all over the face   CHIEF COMPLAINT:   Chief Complaint  Patient presents with  . Fall    REVIEW OF SYSTEMS:    Review of Systems  Constitutional: Negative for chills and fever.  HENT: Negative for hearing loss.   Eyes: Negative for blurred vision, double vision and photophobia.  Respiratory: Positive for cough, sputum production and shortness of breath. Negative for hemoptysis.   Cardiovascular: Negative for chest pain, palpitations, orthopnea and leg swelling.  Gastrointestinal: Negative for abdominal pain, diarrhea and vomiting.  Genitourinary: Negative for dysuria and urgency.  Musculoskeletal: Negative for myalgias and neck pain.  Skin: Negative for rash.  Neurological: Negative for dizziness, focal weakness, seizures, weakness and headaches.  Psychiatric/Behavioral: Negative for memory loss. The patient does not have insomnia.     Nutrition:  Tolerating Diet: Tolerating PT:      DRUG ALLERGIES:  No Known Allergies  VITALS:  Blood pressure (!) 131/54, pulse 84, temperature 98.6 F (37 C), temperature source Oral, resp. rate 18, height 5\' 10"  (1.778 m), weight 70.6 kg (155 lb 11.2 oz), SpO2 95 %.  PHYSICAL EXAMINATION:   Physical Exam  GENERAL:  61 y.o.-year-old patient lying in the bed with no acute distress.  EYES: Pupils equal, round, reactive to light and accommodation. No scleral icterus. Extraocular muscles intact.  HEENT:, normocephalic. Oropharynx and nasopharynx clear. Noted to have bruise on left side of the face NECK:  Supple, no jugular venous distention. No thyroid enlargement, no  tenderness.  LUNGS: Coarse breath sounds bilaterally.Marland Kitchen  CARDIOVASCULAR: S1, S2 normal. No murmurs, rubs, or gallops.  ABDOMEN: Soft, nontender, nondistended. Bowel sounds present. No organomegaly or mass.  EXTREMITIES: No pedal edema, cyanosis, or clubbing.  NEUROLOGIC: Cranial nerves II through XII are intact. Muscle strength 5/5 in all extremities. Sensation intact. Gait not checked.  PSYCHIATRIC: The patient is alert and oriented x 3.  SKIN: No obvious rash, lesion, or ulcer.    LABORATORY PANEL:   CBC  Recent Labs Lab 09/10/16 0617  WBC 6.4  HGB 13.0  HCT 37.8*  PLT 117*   ------------------------------------------------------------------------------------------------------------------  Chemistries   Recent Labs Lab 09/03/16 1822  09/09/16 1934 09/10/16 0617  NA 138  < > 139 138  K 3.3*  < > 3.6 3.2*  CL 109  < > 106 106  CO2 23  < > 26 29  GLUCOSE 134*  < > 159* 105*  BUN 15  < > 18 16  CREATININE 0.77  < > 0.77 0.67  CALCIUM 8.3*  < > 8.5* 7.9*  MG 2.1  --   --   --   AST 130*  --  111*  --   ALT 96*  --  102*  --   ALKPHOS 163*  --  154*  --   BILITOT 1.1  --  1.5*  --   < > = values in this interval not displayed. ------------------------------------------------------------------------------------------------------------------  Cardiac Enzymes  Recent Labs Lab 09/09/16 1934  TROPONINI <0.03   ------------------------------------------------------------------------------------------------------------------  RADIOLOGY:  Ct Head Wo Contrast  Result Date: 09/09/2016 CLINICAL DATA:  Seizure. Multiple falls over the  last 2 days. Bruising to the right head and face. EXAM: CT HEAD WITHOUT CONTRAST CT CERVICAL SPINE WITHOUT CONTRAST TECHNIQUE: Multidetector CT imaging of the head and cervical spine was performed following the standard protocol without intravenous contrast. Multiplanar CT image reconstructions of the cervical spine were also generated.  COMPARISON:  11/06/2014. FINDINGS: CT HEAD FINDINGS Brain: There is no evidence for acute hemorrhage, hydrocephalus, mass lesion, or abnormal extra-axial fluid collection. No definite CT evidence for acute infarction. Diffuse loss of parenchymal volume is consistent with atrophy. Vascular: No hyperdense vessel or unexpected calcification. Skull: No evidence for fracture. No worrisome lytic or sclerotic lesion. Sinuses/Orbits: The visualized paranasal sinuses and mastoid air cells are clear. Visualized portions of the globes and intraorbital fat are unremarkable. Other: None. CT CERVICAL SPINE FINDINGS Alignment: Straightening of normal cervical lordosis. No subluxation. Skull base and vertebrae: No acute fracture. No primary bone lesion or focal pathologic process. Soft tissues and spinal canal: No prevertebral fluid or swelling. No visible canal hematoma. Disc levels: Loss of disc height with endplate degeneration is seen at C6-7. Remaining intervertebral disc spaces are preserved. Upper chest: Negative. Other: None. IMPRESSION: 1. No acute intracranial abnormality. 2. No evidence for cervical spine fracture. 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 3. Loss of cervical lordosis. This can be related to patient positioning, muscle spasm or soft tissue injury. Electronically Signed   By: Misty Stanley M.D.   On: 09/09/2016 19:52   Ct Cervical Spine Wo Contrast  Result Date: 09/09/2016 CLINICAL DATA:  Seizure. Multiple falls over the last 2 days. Bruising to the right head and face. EXAM: CT HEAD WITHOUT CONTRAST CT CERVICAL SPINE WITHOUT CONTRAST TECHNIQUE: Multidetector CT imaging of the head and cervical spine was performed following the standard protocol without intravenous contrast. Multiplanar CT image reconstructions of the cervical spine were also generated. COMPARISON:  11/06/2014. FINDINGS: CT HEAD FINDINGS Brain: There is no evidence for acute hemorrhage, hydrocephalus, mass lesion, or abnormal extra-axial  fluid collection. No definite CT evidence for acute infarction. Diffuse loss of parenchymal volume is consistent with atrophy. Vascular: No hyperdense vessel or unexpected calcification. Skull: No evidence for fracture. No worrisome lytic or sclerotic lesion. Sinuses/Orbits: The visualized paranasal sinuses and mastoid air cells are clear. Visualized portions of the globes and intraorbital fat are unremarkable. Other: None. CT CERVICAL SPINE FINDINGS Alignment: Straightening of normal cervical lordosis. No subluxation. Skull base and vertebrae: No acute fracture. No primary bone lesion or focal pathologic process. Soft tissues and spinal canal: No prevertebral fluid or swelling. No visible canal hematoma. Disc levels: Loss of disc height with endplate degeneration is seen at C6-7. Remaining intervertebral disc spaces are preserved. Upper chest: Negative. Other: None. IMPRESSION: 1. No acute intracranial abnormality. 2. No evidence for cervical spine fracture. 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 3. Loss of cervical lordosis. This can be related to patient positioning, muscle spasm or soft tissue injury. Electronically Signed   By: Misty Stanley M.D.   On: 09/09/2016 19:52   Ct Abdomen Pelvis W Contrast  Result Date: 09/09/2016 CLINICAL DATA:  61 year old male with abdominal pain, fall and abdominal bruising. EXAM: CT ABDOMEN AND PELVIS WITH CONTRAST TECHNIQUE: Multidetector CT imaging of the abdomen and pelvis was performed using the standard protocol following bolus administration of intravenous contrast. The patient had a seizure during the examination and 80 cc of contrast extravasated in the patient's left arm. ER aware of this extravasation. CONTRAST:  80 cc ISOVUE-300 IOPAMIDOL (ISOVUE-300) INJECTION 61% COMPARISON:  09/04/2016 ultrasound and  08/04/2015 MR FINDINGS: Please note that parenchymal abnormalities may be missed without intravenous contrast, as it extravasated during the study. Lower chest: A  small to moderate right pleural effusion is noted. Hepatobiliary: Cirrhosis identified. Cholelithiasis noted. Gallbladder wall thickening is nonspecific. No definite biliary dilatation. Pancreas: Unremarkable Spleen: Upper limits normal spleen size. Adrenals/Urinary Tract: Punctate nonobstructing bilateral renal calculi noted. The adrenal glands and bladder are unremarkable. Stomach/Bowel: No evidence of bowel obstruction or definite bowel wall thickening. The appendix is normal. Vascular/Lymphatic: Aortic atherosclerosis. Splenic varices noted. No enlarged abdominal or pelvic lymph nodes. Reproductive: Prostate is unremarkable. Other: A moderate amount of ascites is noted. No evidence of pneumoperitoneum. Musculoskeletal: No acute abnormality. Degenerative changes in the lower lumbar spine are present. IMPRESSION: Cirrhosis, upper limits normal spleen size and moderate ascites. Small to moderate right pleural effusion. Cholelithiasis and gallbladder wall thickening. Gallbladder wall thickening is most likely related to hepatic dysfunction/ascites but consider nuclear medicine study if there is strong clinical suspicion for acute cholecystitis. Aortic Atherosclerosis (ICD10-I70.0). Electronically Signed   By: Margarette Canada M.D.   On: 09/09/2016 19:57   Dg Chest Portable 1 View  Result Date: 09/09/2016 CLINICAL DATA:  Weakness.  Recent pneumonia. EXAM: PORTABLE CHEST 1 VIEW COMPARISON:  Radiograph 09/03/2016, 07/15/2016 FINDINGS: Right perihilar opacity is more confluent in the suprahilar region. Surrounding patchy opacity is also seen. Right pleural effusion, grossly similar to prior. Normal heart size with atherosclerosis of the thoracic aorta. Stable left hilar prominence. Underlying emphysema again seen. No pneumothorax. The bones are under mineralized. IMPRESSION: 1. Previous right perihilar opacities are more confluent than exam 1 week prior. This is likely progressive pneumonia, underlying neoplasm not  excluded. Recommend radiographic follow-up to resolution. If there are no clinical signs of pneumonia, consider chest CT now. 2. Right pleural effusion, grossly similar. 3. Emphysema. Electronically Signed   By: Jeb Levering M.D.   On: 09/09/2016 20:02     ASSESSMENT AND PLAN:   Active Problems:   Sepsis ([place])  Sepsis present on admission secondary to healthcare associated pneumonia: Continue vancomycin, cefepime, WBC normal, he says he is feeling better. #2. Possible seizure at home: Likely due to pneumonia . Get neurology consult, follow seizure precautions never had seizure. Head CT unremarkable. 3 hypokalemia replace the potassium #4 hepatitis C, cirrhosis, chronic pain syndrome: Patient uses methadone. Continue lactulose, rifaximin. Ammonia level normal. #5. Questionable noncompliance with medicines at home, recently was discharged with Augmentin, Levaquin for pneumonia but not sure if patient took the medicines as prescribed. 6. COPD: Stable no wheezing. #7. chronic pain syndrome; on methadone.  Multiple falls, physical therapy consulted. All the records are reviewed and case discussed with Care Management/Social Workerr. Management plans discussed with the patient, family and they are in agreement.  CODE STATUS: full  TOTAL TIME TAKING CARE OF THIS PATIENT: 51minutes.   POSSIBLE D/C IN 1-2DAYS, DEPENDING ON CLINICAL CONDITION.   Epifanio Lesches M.D on 09/10/2016 at 2:47 PM  Between 7am to 6pm - Pager - [phone]  After 6pm go to [email or web address] - password [email or web address] [facility]  Office  [phone]  CC: Primary care physician; Theotis Burrow, MD

## 2016-09-11 DIAGNOSIS — W19XXXA Unspecified fall, initial encounter: Secondary | ICD-10-CM

## 2016-09-11 DIAGNOSIS — R531 Weakness: Secondary | ICD-10-CM

## 2016-09-11 DIAGNOSIS — R410 Disorientation, unspecified: Secondary | ICD-10-CM

## 2016-09-11 DIAGNOSIS — J188 Other pneumonia, unspecified organism: Secondary | ICD-10-CM

## 2016-09-11 LAB — BASIC METABOLIC PANEL
ANION GAP: 4 — AB (ref 5–15)
BUN: 12 mg/dL (ref 6–20)
CALCIUM: 8 mg/dL — AB (ref 8.9–10.3)
CO2: 26 mmol/L (ref 22–32)
CREATININE: 0.49 mg/dL — AB (ref 0.61–1.24)
Chloride: 108 mmol/L (ref 101–111)
GLUCOSE: 111 mg/dL — AB (ref 65–99)
Potassium: 3.8 mmol/L (ref 3.5–5.1)
Sodium: 138 mmol/L (ref 135–145)

## 2016-09-11 LAB — CREATININE, SERUM: Creatinine, Ser: 0.54 mg/dL — ABNORMAL LOW (ref 0.61–1.24)

## 2016-09-11 LAB — VANCOMYCIN, TROUGH
VANCOMYCIN TR: 30 ug/mL — AB (ref 15–20)
Vancomycin Tr: 15 ug/mL (ref 15–20)
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
## 2016-09-11 NOTE — Progress Notes (Signed)
Pharmacy Antibiotic Note  [name] is a 61 y.o. male admitted on 09/09/2016 with sepsis.  Pharmacy has been consulted for vanc/cefepime dosing.  VT= 15 mcg/ml  Plan: Continue vancomycin 750 mg iv q 8 hours with trough at goal of 15-20 mcg/ml. Will continue to monitor renal function closely and recheck levels as clinically indicated at least once weekly.   Will continue cefepime 1g IV q8h.  Height: 5\' 10"  (177.8 cm) Weight: 155 lb 11.2 oz (70.6 kg) IBW/kg (Calculated) : 73  Temp (24hrs), Avg:98.5 F (36.9 C), Min:98.4 F (36.9 C), Max:98.6 F (37 C)   Recent Labs Lab 09/09/16 1934 09/09/16 2247 09/10/16 0617 09/11/16 0605 09/11/16 0701 09/11/16 1339  WBC 7.3  --  6.4  --   --   --   CREATININE 0.77  --  0.67 0.54* 0.49*  --   LATICACIDVEN 3.5* 1.3  --   --   --   --   VANCOTROUGH  --   --   --  30*  --  15    Estimated Creatinine Clearance: 96.8 mL/min (A) (by C-G formula based on SCr of 0.49 mg/dL (L)).    No Known Allergies   Thank you for allowing pharmacy to be a part of this patient's care.  Ulice Dash, PharmD Clinical Pharmacist  09/11/2016

## 2016-09-11 NOTE — Progress Notes (Signed)
[place] [facility] [place] NAME: [name]    MR#:  [id number]  DATE OF BIRTH:  [birth year]  SUBJECTIVE: Admitted for multiple falls, possible seizure at home, found to have pneumonia. Mother says that he had a seizure and seizure caused him to fall. No tongue biting, mother noticed that he was shaking and have bruises all over the face  , breathing comfortably, denies any complaints.   CHIEF COMPLAINT:   Chief Complaint  Patient presents with  . Fall    REVIEW OF SYSTEMS:    Review of Systems  Constitutional: Negative for chills and fever.  HENT: Negative for hearing loss.   Eyes: Negative for blurred vision, double vision and photophobia.  Respiratory: Positive for cough and sputum production. Negative for hemoptysis and shortness of breath.   Cardiovascular: Negative for chest pain, palpitations, orthopnea and leg swelling.  Gastrointestinal: Negative for abdominal pain, diarrhea and vomiting.  Genitourinary: Negative for dysuria and urgency.  Musculoskeletal: Negative for myalgias and neck pain.  Skin: Negative for rash.  Neurological: Negative for dizziness, focal weakness, seizures, weakness and headaches.  Psychiatric/Behavioral: Negative for memory loss. The patient does not have insomnia.     Nutrition:  Tolerating Diet: Tolerating PT:      DRUG ALLERGIES:  No Known Allergies  VITALS:  Blood pressure (!) 129/56, pulse 69, temperature 98.4 F (36.9 C), temperature source Oral, resp. rate 16, height 5\' 10"  (1.778 m), weight 70.6 kg (155 lb 11.2 oz), SpO2 95 %.  PHYSICAL EXAMINATION:   Physical Exam  GENERAL:  61 y.o.-year-old patient lying in the bed with no acute distress.  EYES: Pupils equal, round, reactive to light and accommodation. No scleral icterus. Extraocular muscles intact.  HEENT:, normocephalic. Oropharynx and nasopharynx clear. Noted to have bruise on left side of the face NECK:  Supple, no  jugular venous distention. No thyroid enlargement, no tenderness.  LUNGS:Clear to auscultation bilaterally, no wheezes, rales.  CARDIOVASCULAR: S1, S2 normal. No murmurs, rubs, or gallops.  ABDOMEN: Soft, nontender, nondistended. Bowel sounds present. No organomegaly or mass.  EXTREMITIES: No pedal edema, cyanosis, or clubbing.  NEUROLOGIC: Cranial nerves II through XII are intact. Muscle strength 5/5 in all extremities. Sensation intact. Gait not checked.  PSYCHIATRIC: The patient is alert and oriented x 3.  SKIN: No obvious rash, lesion, or ulcer.    LABORATORY PANEL:   CBC  Recent Labs Lab 09/10/16 0617  WBC 6.4  HGB 13.0  HCT 37.8*  PLT 117*   ------------------------------------------------------------------------------------------------------------------  Chemistries   Recent Labs Lab 09/09/16 1934  09/11/16 0701  NA 139  < > 138  K 3.6  < > 3.8  CL 106  < > 108  CO2 26  < > 26  GLUCOSE 159*  < > 111*  BUN 18  < > 12  CREATININE 0.77  < > 0.49*  CALCIUM 8.5*  < > 8.0*  AST 111*  --   --   ALT 102*  --   --   ALKPHOS 154*  --   --   BILITOT 1.5*  --   --   < > = values in this interval not displayed. ------------------------------------------------------------------------------------------------------------------  Cardiac Enzymes  Recent Labs Lab 09/09/16 1934  TROPONINI <0.03   ------------------------------------------------------------------------------------------------------------------  RADIOLOGY:  Ct Head Wo Contrast  Result Date: 09/09/2016 CLINICAL DATA:  Seizure. Multiple falls over the last 2 days. Bruising to the right head and face. EXAM: CT HEAD WITHOUT CONTRAST CT  CERVICAL SPINE WITHOUT CONTRAST TECHNIQUE: Multidetector CT imaging of the head and cervical spine was performed following the standard protocol without intravenous contrast. Multiplanar CT image reconstructions of the cervical spine were also generated. COMPARISON:  11/06/2014.  FINDINGS: CT HEAD FINDINGS Brain: There is no evidence for acute hemorrhage, hydrocephalus, mass lesion, or abnormal extra-axial fluid collection. No definite CT evidence for acute infarction. Diffuse loss of parenchymal volume is consistent with atrophy. Vascular: No hyperdense vessel or unexpected calcification. Skull: No evidence for fracture. No worrisome lytic or sclerotic lesion. Sinuses/Orbits: The visualized paranasal sinuses and mastoid air cells are clear. Visualized portions of the globes and intraorbital fat are unremarkable. Other: None. CT CERVICAL SPINE FINDINGS Alignment: Straightening of normal cervical lordosis. No subluxation. Skull base and vertebrae: No acute fracture. No primary bone lesion or focal pathologic process. Soft tissues and spinal canal: No prevertebral fluid or swelling. No visible canal hematoma. Disc levels: Loss of disc height with endplate degeneration is seen at C6-7. Remaining intervertebral disc spaces are preserved. Upper chest: Negative. Other: None. IMPRESSION: 1. No acute intracranial abnormality. 2. No evidence for cervical spine fracture. 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 3. Loss of cervical lordosis. This can be related to patient positioning, muscle spasm or soft tissue injury. Electronically Signed   By: Misty Stanley M.D.   On: 09/09/2016 19:52   Ct Cervical Spine Wo Contrast  Result Date: 09/09/2016 CLINICAL DATA:  Seizure. Multiple falls over the last 2 days. Bruising to the right head and face. EXAM: CT HEAD WITHOUT CONTRAST CT CERVICAL SPINE WITHOUT CONTRAST TECHNIQUE: Multidetector CT imaging of the head and cervical spine was performed following the standard protocol without intravenous contrast. Multiplanar CT image reconstructions of the cervical spine were also generated. COMPARISON:  11/06/2014. FINDINGS: CT HEAD FINDINGS Brain: There is no evidence for acute hemorrhage, hydrocephalus, mass lesion, or abnormal extra-axial fluid collection. No  definite CT evidence for acute infarction. Diffuse loss of parenchymal volume is consistent with atrophy. Vascular: No hyperdense vessel or unexpected calcification. Skull: No evidence for fracture. No worrisome lytic or sclerotic lesion. Sinuses/Orbits: The visualized paranasal sinuses and mastoid air cells are clear. Visualized portions of the globes and intraorbital fat are unremarkable. Other: None. CT CERVICAL SPINE FINDINGS Alignment: Straightening of normal cervical lordosis. No subluxation. Skull base and vertebrae: No acute fracture. No primary bone lesion or focal pathologic process. Soft tissues and spinal canal: No prevertebral fluid or swelling. No visible canal hematoma. Disc levels: Loss of disc height with endplate degeneration is seen at C6-7. Remaining intervertebral disc spaces are preserved. Upper chest: Negative. Other: None. IMPRESSION: 1. No acute intracranial abnormality. 2. No evidence for cervical spine fracture. 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 3. Loss of cervical lordosis. This can be related to patient positioning, muscle spasm or soft tissue injury. Electronically Signed   By: Misty Stanley M.D.   On: 09/09/2016 19:52   Ct Abdomen Pelvis W Contrast  Result Date: 09/09/2016 CLINICAL DATA:  61 year old male with abdominal pain, fall and abdominal bruising. EXAM: CT ABDOMEN AND PELVIS WITH CONTRAST TECHNIQUE: Multidetector CT imaging of the abdomen and pelvis was performed using the standard protocol following bolus administration of intravenous contrast. The patient had a seizure during the examination and 80 cc of contrast extravasated in the patient's left arm. ER aware of this extravasation. CONTRAST:  80 cc ISOVUE-300 IOPAMIDOL (ISOVUE-300) INJECTION 61% COMPARISON:  09/04/2016 ultrasound and 08/04/2015 MR FINDINGS: Please note that parenchymal abnormalities may be missed without intravenous contrast, as it  extravasated during the study. Lower chest: A small to moderate right  pleural effusion is noted. Hepatobiliary: Cirrhosis identified. Cholelithiasis noted. Gallbladder wall thickening is nonspecific. No definite biliary dilatation. Pancreas: Unremarkable Spleen: Upper limits normal spleen size. Adrenals/Urinary Tract: Punctate nonobstructing bilateral renal calculi noted. The adrenal glands and bladder are unremarkable. Stomach/Bowel: No evidence of bowel obstruction or definite bowel wall thickening. The appendix is normal. Vascular/Lymphatic: Aortic atherosclerosis. Splenic varices noted. No enlarged abdominal or pelvic lymph nodes. Reproductive: Prostate is unremarkable. Other: A moderate amount of ascites is noted. No evidence of pneumoperitoneum. Musculoskeletal: No acute abnormality. Degenerative changes in the lower lumbar spine are present. IMPRESSION: Cirrhosis, upper limits normal spleen size and moderate ascites. Small to moderate right pleural effusion. Cholelithiasis and gallbladder wall thickening. Gallbladder wall thickening is most likely related to hepatic dysfunction/ascites but consider nuclear medicine study if there is strong clinical suspicion for acute cholecystitis. Aortic Atherosclerosis (ICD10-I70.0). Electronically Signed   By: Margarette Canada M.D.   On: 09/09/2016 19:57   Dg Chest Portable 1 View  Result Date: 09/09/2016 CLINICAL DATA:  Weakness.  Recent pneumonia. EXAM: PORTABLE CHEST 1 VIEW COMPARISON:  Radiograph 09/03/2016, 07/15/2016 FINDINGS: Right perihilar opacity is more confluent in the suprahilar region. Surrounding patchy opacity is also seen. Right pleural effusion, grossly similar to prior. Normal heart size with atherosclerosis of the thoracic aorta. Stable left hilar prominence. Underlying emphysema again seen. No pneumothorax. The bones are under mineralized. IMPRESSION: 1. Previous right perihilar opacities are more confluent than exam 1 week prior. This is likely progressive pneumonia, underlying neoplasm not excluded. Recommend  radiographic follow-up to resolution. If there are no clinical signs of pneumonia, consider chest CT now. 2. Right pleural effusion, grossly similar. 3. Emphysema. Electronically Signed   By: Jeb Levering M.D.   On: 09/09/2016 20:02     ASSESSMENT AND PLAN:   Active Problems:   Sepsis ([place])  Sepsis present on admission secondary to healthcare associated pneumonia: Continue vancomycin, cefepime, WBC normal, he says he is feeling better. Blood cultures are negative. WBC normalized. Discontinue IV hydration, continue empiric antibiotics, discharged recently and comes back with pneumonia he needs little longer time this time. #2. Possible seizure at home: Likely due to pneumonia . Get neurology consult, follow seizure precautions never had seizure. Head CT unremarkable. Neurology consult is pending. 3 hypokalemia replaced the potassium, potassium is better.  #4 hepatitis C, cirrhosis, chronic pain syndrome: Patient uses methadone. Continue lactulose, rifaximin. Ammonia level normal. #5. Questionable noncompliance with medicines at home, recently was discharged with Augmentin, Levaquin for pneumonia but not sure if patient took the medicines as prescribed. 6. COPD: Stable no wheezing.  #7. chronic pain syndrome; on methadone.  Multiple falls, physical therapy consulted. Discussed with patient's mother yesterday.  All the records are reviewed and case discussed with Care Management/Social Workerr. Management plans discussed with the patient, family and they are in agreement.  CODE STATUS: full  TOTAL TIME TAKING CARE OF THIS PATIENT: 26minutes.   POSSIBLE D/C IN 1-2DAYS, DEPENDING ON CLINICAL CONDITION.   Epifanio Lesches M.D on 09/11/2016 at 9:00 AM  Between 7am to 6pm - Pager - [phone]  After 6pm go to [email or web address] - password [email or web address] [facility]  Office  [phone]  CC: Primary care physician; Theotis Burrow, MD

## 2016-09-11 NOTE — Progress Notes (Signed)
Pharmacy Antibiotic Note  Azan Maneri is a 61 y.o. male admitted on 09/09/2016 with sepsis.  Pharmacy has been consulted for vanc/cefepime dosing.  Plan: Will give patient vanc 1g and cefepime 1g IV x 1   Will follow up w/ vanc 750 mg q8h w/ 6 hour stack dose. Will draw vanc trough 7/4 @ 0600 prior to 4th dose. Ke 0.0847 T1/2 8 hours  7/4 @ 0600 VT 30 mcg/mL. Level was drawn 0605 dose was hung 0601, so level may have been incorrectly drawn. Drawing stat bmp to ensure renal function. Rescheduled VT for today @ 1300.  Will start cefepime 1g IV q8h   Height: 5\' 10"  (177.8 cm) Weight: 155 lb 11.2 oz (70.6 kg) IBW/kg (Calculated) : 73  Temp (24hrs), Avg:98.5 F (36.9 C), Min:98.4 F (36.9 C), Max:98.6 F (37 C)   Recent Labs Lab 09/09/16 1934 09/09/16 2247 09/10/16 0617 09/11/16 0605  WBC 7.3  --  6.4  --   CREATININE 0.77  --  0.67 0.54*  LATICACIDVEN 3.5* 1.3  --   --   VANCOTROUGH  --   --   --  30*    Estimated Creatinine Clearance: 96.8 mL/min (A) (by C-G formula based on SCr of 0.54 mg/dL (L)).    No Known Allergies   Thank you for allowing pharmacy to be a part of this patient's care.  Tobie Lords, PharmD, BCPS Clinical Pharmacist 09/11/2016

## 2016-09-11 NOTE — Evaluation (Signed)
Physical Therapy Evaluation Patient Details Name: [name] MRN: [id number] DOB: [birth year] Today's Date: 09/11/2016   History of Present Illness  Pt is a 61 y.o. male presenting to hospital s/p multiple falls with bruising to head and abdomen; pt also with weakness and confusion.  Recent dx of PNA.  Pt with tonic clonic seizure in CT scanner and was postictal.  Pt admitted with health care acquired PNA.  PMH includes h/o polysubstance abuse, COPD, DM, Hep C, htn, and chronic back pain.  Clinical Impression  Prior to hospital admission, pt reports being independent with functional mobility (multiple falls prior to admission).  Pt lives with his mom in 1 level home with 3 steps to enter with L railing.  Currently pt is SBA supine to/from sit; CGA with transfers; and CGA with ambulation around nursing loop.  Pt mildly unsteady ambulating without UE support but no overt loss of balance noted.  Pt's O2 sats 91% on 3 L O2 via nasal cannula at rest and O2 decreased to 84% post ambulation (nursing notified) requiring sitting rest break to improve O2 sats.  Impulsive behavior noted with functional mobility.  Pt would benefit from skilled PT to address noted impairments and functional limitations (see below for any additional details).  Upon hospital discharge, recommend pt discharge to home with support of family.    Follow Up Recommendations Home health PT;Supervision for mobility/OOB    Equipment Recommendations       Recommendations for Other Services       Precautions / Restrictions Precautions Precautions: Fall Restrictions Weight Bearing Restrictions: No      Mobility  Bed Mobility Overal bed mobility: Needs Assistance Bed Mobility: Supine to Sit;Sit to Supine     Supine to sit: Supervision;HOB elevated Sit to supine: Supervision;HOB elevated   General bed mobility comments: SBA for safety d/t impulsiveness  Transfers Overall transfer level: Needs assistance Equipment used:  None Transfers: Sit to/from Stand Sit to Stand: Min guard         General transfer comment: CGA for safety d/t impulsiveness but no overt loss of balance noted  Ambulation/Gait Ambulation/Gait assistance: Min guard Ambulation Distance (Feet): 200 Feet Assistive device:  (IV pole and no UE support) Gait Pattern/deviations: Step-through pattern Gait velocity: increased cadence   General Gait Details: first 100 feet pt ambulating with B UE and then single UE support on IV pole (pt steady without loss of balance); 2nd 100 feet pt ambulating no UE support (mildly unsteady but no overt loss of balance noted)  Stairs            Wheelchair Mobility    Modified Rankin (Stroke Patients Only)       Balance Overall balance assessment: Needs assistance;History of Falls Sitting-balance support: No upper extremity supported;Feet supported Sitting balance-Leahy Scale: Good Sitting balance - Comments: sitting reaching within BOS   Standing balance support: During functional activity;No upper extremity supported Standing balance-Leahy Scale: Good Standing balance comment: with ambulation no UE support                             Pertinent Vitals/Pain Pain Assessment: No/denies pain  HR WFL during session.    Home Living Family/patient expects to be discharged to:: Private residence Living Arrangements: Parent (Mom) Available Help at Discharge: Family Type of Home: House Home Access: Stairs to enter Entrance Stairs-Rails: Left Entrance Stairs-Number of Steps: 3 Home Layout: One level Home Equipment: None  Prior Function Level of Independence: Independent         Comments: Pt reports multiple recent falls (not sure why).     Hand Dominance        Extremity/Trunk Assessment   Upper Extremity Assessment Upper Extremity Assessment: Generalized weakness    Lower Extremity Assessment Lower Extremity Assessment: Generalized weakness        Communication   Communication: No difficulties  Cognition Arousal/Alertness: Awake/alert Behavior During Therapy: Impulsive;Restless Overall Cognitive Status: No family/caregiver present to determine baseline cognitive functioning (Oriented to person and place)                                        General Comments General comments (skin integrity, edema, etc.): Pt resting in bed upon PT arrival.  Nursing cleared pt for participation in physical therapy.  Pt agreeable to PT session.    Exercises     Assessment/Plan    PT Assessment Patient needs continued PT services  PT Problem List Decreased strength;Decreased balance;Decreased safety awareness       PT Treatment Interventions DME instruction;Gait training;Stair training;Functional mobility training;Therapeutic activities;Therapeutic exercise;Balance training;Patient/family education    PT Goals (Current goals can be found in the Care Plan section)  Acute Rehab PT Goals Patient Stated Goal: to go home PT Goal Formulation: With patient Time For Goal Achievement: 09/25/16 Potential to Achieve Goals: Good    Frequency Min 2X/week   Barriers to discharge        Co-evaluation               AM-PAC PT "6 Clicks" Daily Activity  Outcome Measure Difficulty turning over in bed (including adjusting bedclothes, sheets and blankets)?: None Difficulty moving from lying on back to sitting on the side of the bed? : A Little Difficulty sitting down on and standing up from a chair with arms (e.g., wheelchair, bedside commode, etc,.)?: A Little Help needed moving to and from a bed to chair (including a wheelchair)?: A Little Help needed walking in hospital room?: A Little Help needed climbing 3-5 steps with a railing? : A Little 6 Click Score: 19    End of Session Equipment Utilized During Treatment: Gait belt;Oxygen (3 L O2 via nasal cannula) Activity Tolerance: Patient tolerated treatment well Patient  left: in bed;with call bell/phone within reach;with bed alarm set;with family/visitor present Nurse Communication: Mobility status;Precautions (Pt's O2 status during session) PT Visit Diagnosis: Unsteadiness on feet (R26.81);Repeated falls (R29.6);Muscle weakness (generalized) (M62.81);Difficulty in walking, not elsewhere classified (R26.2)    Time: 1420-1440 PT Time Calculation (min) (ACUTE ONLY): 20 min   Charges:   PT Evaluation $PT Eval Low Complexity: 1 Procedure     PT G CodesLeitha Bleak, PT 09/11/16, 2:55 PM [phone]

## 2016-09-12 LAB — MRSA PCR SCREENING: MRSA by PCR: NEGATIVE

## 2016-09-12 NOTE — Progress Notes (Signed)
[place] at [place] NAME: [name]    MR#:  [id number]  DATE OF BIRTH:  February 15, 1956  SUBJECTIVE:  patient denies shortness of breath or cough. Feeling  better today.   CHIEF COMPLAINT:   Chief Complaint  Patient presents with  . Fall    REVIEW OF SYSTEMS:    Review of Systems  Constitutional: Negative for chills and fever.  HENT: Negative for hearing loss.   Eyes: Negative for blurred vision, double vision and photophobia.  Respiratory: Negative for cough, hemoptysis, sputum production and shortness of breath.   Cardiovascular: Negative for chest pain, palpitations, orthopnea and leg swelling.  Gastrointestinal: Negative for abdominal pain, diarrhea and vomiting.  Genitourinary: Negative for dysuria and urgency.  Musculoskeletal: Negative for myalgias and neck pain.  Skin: Negative for rash.  Neurological: Negative for dizziness, focal weakness, seizures, weakness and headaches.  Psychiatric/Behavioral: Negative for memory loss. The patient does not have insomnia.     Nutrition:  Tolerating Diet: Tolerating PT:      DRUG ALLERGIES:  No Known Allergies  VITALS:  Blood pressure 140/62, pulse 73, temperature 98.6 F (37 C), resp. rate 18, height 5\' 10"  (1.778 m), weight 70.6 kg (155 lb 11.2 oz), SpO2 95 %.  PHYSICAL EXAMINATION:   Physical Exam  GENERAL:  61 y.o.-year-old patient lying in the bed with no acute distress.  EYES: Pupils equal, round, reactive to light and accommodation. No scleral icterus. Extraocular muscles intact.  HEENT:, normocephalic. Oropharynx and nasopharynx clear. Noted to have bruise on left side of the face NECK:  Supple, no jugular venous distention. No thyroid enlargement, no tenderness.  LUNGS:Clear to auscultation bilaterally, no wheezes, rales.  CARDIOVASCULAR: S1, S2 normal. No murmurs, rubs, or gallops.  ABDOMEN: Soft, nontender, nondistended. Bowel sounds present. No  organomegaly or mass.  EXTREMITIES: No pedal edema, cyanosis, or clubbing.  NEUROLOGIC: Cranial nerves II through XII are intact. Muscle strength 5/5 in all extremities. Sensation intact. Gait not checked.  PSYCHIATRIC: The patient is alert and oriented x 3.  SKIN: No obvious rash, lesion, or ulcer.    LABORATORY PANEL:   CBC  Recent Labs Lab 09/10/16 0617  WBC 6.4  HGB 13.0  HCT 37.8*  PLT 117*   ------------------------------------------------------------------------------------------------------------------  Chemistries   Recent Labs Lab 09/09/16 1934  09/11/16 0701  NA 139  < > 138  K 3.6  < > 3.8  CL 106  < > 108  CO2 26  < > 26  GLUCOSE 159*  < > 111*  BUN 18  < > 12  CREATININE 0.77  < > 0.49*  CALCIUM 8.5*  < > 8.0*  AST 111*  --   --   ALT 102*  --   --   ALKPHOS 154*  --   --   BILITOT 1.5*  --   --   < > = values in this interval not displayed. ------------------------------------------------------------------------------------------------------------------  Cardiac Enzymes  Recent Labs Lab 09/09/16 1934  TROPONINI <0.03   ------------------------------------------------------------------------------------------------------------------  RADIOLOGY:  No results found.   ASSESSMENT AND PLAN:   Active Problems:   Sepsis ([place])  Sepsis present on admission secondary to healthcare associated pneumonia: Continue vancomycin, cefepime, WBC normal, he says he is feeling better. Blood cultures are negative. WBC normalized. Discontinue IV hydration, continue empiric antibiotics, discharged recently and comes back with pneumonia he needs little longer time this time. Wean off o2. #2. 3 hypokalemia replaced the potassium, potassium is better.  #  4 hepatitis C, cirrhosis, chronic pain syndrome: Patient uses methadone. Continue lactulose, rifaximin. Ammonia level normal. #5. Questionable noncompliance with medicines at home, recently was discharged with  Augmentin, Levaquin for pneumonia but not sure if patient took the medicines as prescribed. 6. COPD: Stable no wheezing.  #7. chronic pain syndrome; on methadone.  Multiple falls, physical therapy commence home health physical therapy, likely discharge tomorrow. Discussed with patient's mother yesterday.  All the records are reviewed and case discussed with Care Management/Social Workerr. Management plans discussed with the patient, family and they are in agreement.  CODE STATUS: full  TOTAL TIME TAKING CARE OF THIS PATIENT: 30minutes.   POSSIBLE D/C IN 1-2DAYS, DEPENDING ON CLINICAL CONDITION.   Epifanio Lesches M.D on 09/12/2016 at 12:27 PM  Between 7am to 6pm - Pager - [phone]  After 6pm go to [email or web address] - password [email or web address] [facility]  Office  [phone]  CC: Primary care physician; Theotis Burrow, MD

## 2016-09-13 NOTE — Progress Notes (Addendum)
Patient complaining of left rib pain and a popping sensation with deep breathing and cough. Raquel Sarna, Physical Therapist stated that she indeed felt popping on left rib area when patient takes deep breath. Dr. Vianne Bulls notified.

## 2016-09-13 NOTE — Progress Notes (Signed)
[place] at [place] NAME: [name]    MR#:  [id number]  DATE OF BIRTH:  Jan 26, 1956  SUBJECTIVE:  Complains of left pleuritic chest pain, he says is not new. No shortness of breath. But feels like he cannot go home today.   CHIEF COMPLAINT:   Chief Complaint  Patient presents with  . Fall    REVIEW OF SYSTEMS:    Review of Systems  Constitutional: Negative for chills and fever.  HENT: Negative for hearing loss.   Eyes: Negative for blurred vision, double vision and photophobia.  Respiratory: Negative for cough, hemoptysis, sputum production and shortness of breath.   Cardiovascular: Negative for chest pain, palpitations, orthopnea and leg swelling.  Gastrointestinal: Negative for abdominal pain, diarrhea and vomiting.  Genitourinary: Negative for dysuria and urgency.  Musculoskeletal: Negative for myalgias and neck pain.  Skin: Negative for rash.  Neurological: Negative for dizziness, focal weakness, seizures, weakness and headaches.  Psychiatric/Behavioral: Negative for memory loss. The patient does not have insomnia.     Nutrition:  Tolerating Diet: Tolerating PT:      DRUG ALLERGIES:  No Known Allergies  VITALS:  Blood pressure 127/64, pulse 68, temperature 98.2 F (36.8 C), temperature source Oral, resp. rate 16, height 5\' 10"  (1.778 m), weight 70.6 kg (155 lb 11.2 oz), SpO2 97 %.  PHYSICAL EXAMINATION:   Physical Exam  GENERAL:  61 y.o.-year-old patient lying in the bed with no acute distress.  EYES: Pupils equal, round, reactive to light and accommodation. No scleral icterus. Extraocular muscles intact.  HEENT:, normocephalic. Oropharynx and nasopharynx clear. Noted to have bruise on left side of the face NECK:  Supple, no jugular venous distention. No thyroid enlargement, no tenderness.  LUNGS:Clear to auscultation bilaterally, no wheezes, rales.  CARDIOVASCULAR: S1, S2 normal. No murmurs, rubs, or  gallops.  ABDOMEN: Soft, nontender, nondistended. Bowel sounds present. No organomegaly or mass.  EXTREMITIES: No pedal edema, cyanosis, or clubbing.  NEUROLOGIC: Cranial nerves II through XII are intact. Muscle strength 5/5 in all extremities. Sensation intact. Gait not checked.  PSYCHIATRIC: The patient is alert and oriented x 3.  SKIN: No obvious rash, lesion, or ulcer.    LABORATORY PANEL:   CBC  Recent Labs Lab 09/10/16 0617  WBC 6.4  HGB 13.0  HCT 37.8*  PLT 117*   ------------------------------------------------------------------------------------------------------------------  Chemistries   Recent Labs Lab 09/09/16 1934  09/11/16 0701  NA 139  < > 138  K 3.6  < > 3.8  CL 106  < > 108  CO2 26  < > 26  GLUCOSE 159*  < > 111*  BUN 18  < > 12  CREATININE 0.77  < > 0.49*  CALCIUM 8.5*  < > 8.0*  AST 111*  --   --   ALT 102*  --   --   ALKPHOS 154*  --   --   BILITOT 1.5*  --   --   < > = values in this interval not displayed. ------------------------------------------------------------------------------------------------------------------  Cardiac Enzymes  Recent Labs Lab 09/09/16 1934  TROPONINI <0.03   ------------------------------------------------------------------------------------------------------------------  RADIOLOGY:  No results found.   ASSESSMENT AND PLAN:   Active Problems:   Sepsis ([place])  Sepsis present on admission secondary to healthcare associated pneumonia: Continue vancomycin, cefepime, WBC normal, he says he is feeling better. Blood cultures are negative. WBC normalized. Discontinue IV hydration, continue empiric antibiotics, discharged recently and comes back with pneumonia he needs little longer  time this time. Wean off o2.Discharge home tomorrow with Levaquin. MRSA  screen is negative. #2. 3 hypokalemia replaced the potassium, potassium is better.  #4 hepatitis C, cirrhosis, chronic pain syndrome: Patient uses methadone.  Continue lactulose, rifaximin. Ammonia level normal. #5. Questionable noncompliance with medicines at home, recently was discharged with Augmentin, Levaquin for pneumonia but not sure if patient took the medicines as prescribed. 6. COPD: Stable no wheezing.continue home dose inhalers. .  #7. chronic pain syndrome; on methadone.  Multiple falls, physical therapy commence home health physical therapy, Discussed with patient's mother yesterday.  All the records are reviewed and case discussed with Care Management/Social Workerr. Management plans discussed with the patient, family and they are in agreement.  CODE STATUS: full  TOTAL TIME TAKING CARE OF THIS PATIENT: 7minutes.   POSSIBLE D/C IN 1-2DAYS, DEPENDING ON CLINICAL CONDITION.   Epifanio Lesches M.D on 09/13/2016 at 12:44 PM  Between 7am to 6pm - Pager - [phone]  After 6pm go to [email or web address] - password [email or web address] [facility]  Office  [phone]  CC: Primary care physician; Theotis Burrow, MD

## 2016-09-13 NOTE — Progress Notes (Signed)
Physical Therapy Treatment Patient Details Name: [name] MRN: [id number] DOB: 04/29/1955 Today's Date: 09/13/2016    History of Present Illness Pt is a 61 y.o. male presenting to hospital s/p multiple falls with bruising to head and abdomen; pt also with weakness and confusion.  Recent dx of PNA.  Pt with tonic clonic seizure in CT scanner and was postictal.  Pt admitted with health care acquired PNA.  PMH includes h/o polysubstance abuse, COPD, DM, Hep C, htn, and chronic back pain.    PT Comments    Pt able to ambulate 200 ft (no AD) with high level balance incorporated (turning head to wave at nursing staff) with no overt loss of balance. Pt also able to reach outside of base of support with functional reaching activity with no loss of balance (in sitting and standing). Pt reports pain and "cracking/popping" in his L rib area that he states has been present since his admission/recent fall but pt reports not notifying staff about cracking/popping until today (palpable popping sensation felt in L rib area; nursing notified after session). Pt will continue to benefit from skilled PT to work on higher level balance activities and to increase his endurance.    Follow Up Recommendations  Home health PT;Supervision for mobility/OOB     Equipment Recommendations       Recommendations for Other Services       Precautions / Restrictions Precautions Precautions: Fall Restrictions Weight Bearing Restrictions: No    Mobility  Bed Mobility Overal bed mobility: Modified Independent Bed Mobility: Supine to Sit;Sit to Supine     Supine to sit: HOB elevated Sit to supine: HOB elevated      Transfers Overall transfer level: Needs assistance Equipment used: None Transfers: Sit to/from Stand Sit to Stand: Min guard         General transfer comment: CGA for safety d/t impulsiveness but no overt loss of balance during today's session  Ambulation/Gait Ambulation/Gait assistance:  Min guard Ambulation Distance (Feet): 200 Feet   Gait Pattern/deviations: Step-through pattern Gait velocity: increased cadence   General Gait Details: first 100 feet pt ambulating with minimal single UE support on IV pole (pt steady with no loss of balance); standing rest break for pulse ox check; second 100 ft pt ambulating with no UE support (some unsteadiness noted when turning to wave at nursing staff but no overt loss of balance)   Stairs            Wheelchair Mobility    Modified Rankin (Stroke Patients Only)       Balance Overall balance assessment: Needs assistance;History of Falls Sitting-balance support: No upper extremity supported;Feet supported Sitting balance-Leahy Scale: Good Sitting balance - Comments: able to reach outside of BOS to adjust one sock at a time    Standing balance support: No upper extremity supported Standing balance-Leahy Scale: Good Standing balance comment: able to reach outside of BOS with CGA for safety Single Leg Stance - Right Leg: 2 Single Leg Stance - Left Leg: 0         High level balance activites: Head turns High Level Balance Comments: during functional activity pt able to walk and turn his head to wave at nurses while walking around the nursing station            Cognition Arousal/Alertness: Awake/alert Behavior During Therapy: Restless;Impulsive Overall Cognitive Status: No family/caregiver present to determine baseline cognitive functioning  Exercises Total Joint Exercises Marching in Standing: AROM;Strengthening;Both;10 reps;Standing (with CGA for safety)    General Comments        Pertinent Vitals/Pain Pain Assessment: 0-10 Pain Score: 9  Pain Location: L (Ribs) Pain Descriptors / Indicators: Sharp;Stabbing Pain Intervention(s): Limited activity within patient's tolerance;Monitored during session;Repositioned (nursing notified; pt states he has had  the L rib pain since fall/admission)  O2 level dropped to 90% during activity, but increased to 95% once pt returned to bed (pt on 3 L O2 via nasal cannula throughout today's session)    Home Living                      Prior Function            PT Goals (current goals can now be found in the care plan section) Acute Rehab PT Goals Patient Stated Goal: to return home PT Goal Formulation: With patient Time For Goal Achievement: 09/25/16 Potential to Achieve Goals: Good Additional Goals Additional Goal #1: Perform objective balance assessment. Progress towards PT goals: Progressing toward goals    Frequency    Min 2X/week      PT Plan Current plan remains appropriate    Co-evaluation              AM-PAC PT "6 Clicks" Daily Activity  Outcome Measure  Difficulty turning over in bed (including adjusting bedclothes, sheets and blankets)?: None Difficulty moving from lying on back to sitting on the side of the bed? : None Difficulty sitting down on and standing up from a chair with arms (e.g., wheelchair, bedside commode, etc,.)?: A Little Help needed moving to and from a bed to chair (including a wheelchair)?: A Little Help needed walking in hospital room?: A Little Help needed climbing 3-5 steps with a railing? : A Little 6 Click Score: 20    End of Session Equipment Utilized During Treatment: Gait belt;Oxygen Activity Tolerance: Patient tolerated treatment well Patient left: in bed;with bed alarm set;with call bell/phone within reach Nurse Communication: Mobility status;Precautions;Other (comment) (pt reported pain in ribs that he says has been present since his fall) PT Visit Diagnosis: Unsteadiness on feet (R26.81);Repeated falls (R29.6);Muscle weakness (generalized) (M62.81);Difficulty in walking, not elsewhere classified (R26.2)     Time: 1530-1605 PT Time Calculation (min) (ACUTE ONLY): 35 min  Charges:                       G CodesLaqueta Carina, SPT 2016/09/29,4:48 PM [phone]

## 2016-09-13 NOTE — Progress Notes (Signed)
Pharmacy Antibiotic Note  [name] is a 60 y.o. male admitted on 09/09/2016 with sepsis.  Pharmacy has been consulted for Cefepime   Plan: Will continue cefepime 1g IV q8h.  Height: 5\' 10"  (177.8 cm) Weight: 155 lb 11.2 oz (70.6 kg) IBW/kg (Calculated) : 73  Temp (24hrs), Avg:98.7 F (37.1 C), Min:98.5 F (36.9 C), Max:98.9 F (37.2 C)   Recent Labs Lab 09/09/16 1934 09/09/16 2247 09/10/16 0617 09/11/16 0605 09/11/16 0701 09/11/16 1339  WBC 7.3  --  6.4  --   --   --   CREATININE 0.77  --  0.67 0.54* 0.49*  --   LATICACIDVEN 3.5* 1.3  --   --   --   --   VANCOTROUGH  --   --   --  30*  --  15    Estimated Creatinine Clearance: 96.8 mL/min (A) (by C-G formula based on SCr of 0.49 mg/dL (L)).    No Known Allergies   Thank you for allowing pharmacy to be a part of this patient's care.  Larene Beach, PharmD  Clinical Pharmacist  09/13/2016

## 2016-09-14 ENCOUNTER — Inpatient Hospital Stay: Payer: Medicaid Other

## 2016-09-14 LAB — CULTURE, BLOOD (ROUTINE X 2)
CULTURE: NO GROWTH
Culture: NO GROWTH
SPECIAL REQUESTS: ADEQUATE
Special Requests: ADEQUATE

## 2016-09-14 MED ORDER — IPRATROPIUM-ALBUTEROL 0.5-2.5 (3) MG/3ML IN SOLN
3.0000 mL | Freq: Two times a day (BID) | RESPIRATORY_TRACT | Status: DC
  Administered 2016-09-14 – 2016-09-16 (×4): 3 mL via RESPIRATORY_TRACT
  Filled 2016-09-14 (×4): qty 3

## 2016-09-14 NOTE — Progress Notes (Signed)
Resident alert and verbally abusive ti staff; ripped gown off and toss in floor; offered comfort measures; refused some this shit; Will continue to monitor

## 2016-09-14 NOTE — Progress Notes (Signed)
[place] at [place] NAME: [name]    MR#:  [id number]  DATE OF BIRTH:  1955/12/24  SUBJECTIVE:  heard a pop sound in the left rib area for which we treated chest x-ray which did not show any acute fractures. He says he is still having pain in the left rib area more when she he takes deep breath. X-ray shows persistent pneumonia in the right side.. No fever.   CHIEF COMPLAINT:   Chief Complaint  Patient presents with  . Fall    REVIEW OF SYSTEMS:    Review of Systems  Constitutional: Negative for chills and fever.  HENT: Negative for hearing loss.   Eyes: Negative for blurred vision, double vision and photophobia.  Respiratory: Negative for cough, hemoptysis, sputum production and shortness of breath.   Cardiovascular: Positive for chest pain. Negative for palpitations, orthopnea and leg swelling.  Gastrointestinal: Negative for abdominal pain, diarrhea and vomiting.  Genitourinary: Negative for dysuria and urgency.  Musculoskeletal: Negative for myalgias and neck pain.  Skin: Negative for rash.  Neurological: Negative for dizziness, focal weakness, seizures, weakness and headaches.  Psychiatric/Behavioral: Negative for memory loss. The patient does not have insomnia.   c/o of pleuritic chest pain on the left side.  Nutrition:  Tolerating Diet: Tolerating PT:      DRUG ALLERGIES:  No Known Allergies  VITALS:  Blood pressure (!) 147/68, pulse 71, temperature 98.7 F (37.1 C), temperature source Oral, resp. rate 18, height 5\' 10"  (1.778 m), weight 70.6 kg (155 lb 11.2 oz), SpO2 92 %.  PHYSICAL EXAMINATION:   Physical Exam  GENERAL:  61 y.o.-year-old patient lying in the bed with no acute distress.  EYES: Pupils equal, round, reactive to light and accommodation. No scleral icterus. Extraocular muscles intact.  HEENT:, normocephalic. Oropharynx and nasopharynx clear. Noted to have bruise on left side of the  face NECK:  Supple, no jugular venous distention. No thyroid enlargement, no tenderness.  LUNGS:Clear to auscultation bilaterally, no wheezes, rales.  CARDIOVASCULAR: S1, S2 normal. No murmurs, rubs, or gallops.  ABDOMEN: Soft, nontender, nondistended. Bowel sounds present. No organomegaly or mass.  EXTREMITIES: No pedal edema, cyanosis, or clubbing.  NEUROLOGIC: Cranial nerves II through XII are intact. Muscle strength 5/5 in all extremities. Sensation intact. Gait not checked.  PSYCHIATRIC: The patient is alert and oriented x 3.  SKIN: No obvious rash, lesion, or ulcer.    LABORATORY PANEL:   CBC  Recent Labs Lab 09/10/16 0617  WBC 6.4  HGB 13.0  HCT 37.8*  PLT 117*   ------------------------------------------------------------------------------------------------------------------  Chemistries   Recent Labs Lab 09/09/16 1934  09/11/16 0701  NA 139  < > 138  K 3.6  < > 3.8  CL 106  < > 108  CO2 26  < > 26  GLUCOSE 159*  < > 111*  BUN 18  < > 12  CREATININE 0.77  < > 0.49*  CALCIUM 8.5*  < > 8.0*  AST 111*  --   --   ALT 102*  --   --   ALKPHOS 154*  --   --   BILITOT 1.5*  --   --   < > = values in this interval not displayed. ------------------------------------------------------------------------------------------------------------------  Cardiac Enzymes  Recent Labs Lab 09/09/16 1934  TROPONINI <0.03   ------------------------------------------------------------------------------------------------------------------  RADIOLOGY:  Dg Chest 1 View  Result Date: 09/14/2016 CLINICAL DATA:  Multiple falls. EXAM: CHEST 1 VIEW COMPARISON:  07/02/201806/26/2018.  07/15/2016. FINDINGS: Right suprahilar opacity, new since 07/15/2016 is stable since 09/09/2016. This likely reflects collapse/consolidation. Neoplasm considered less likely given this was not present 2 months ago. There is also collapse/ consolidation at the right base new since the study from 2 months ago  with small right pleural effusion. The right base airspace disease in right effusion is new since 09/09/2016. Left lung remains clear. The cardiopericardial silhouette is within normal limits for size. The visualized bony structures of the thorax are intact. IMPRESSION: Progressive airspace disease in the right lower lung with new small right pleural effusion. Right suprahilar opacity is similar to the study from 5 days ago. Given these changes are new since 07/15/2016 and markedly progressive since 09/03/2016, infectious/inflammatory etiology is favored, however close continued follow-up recommended. Electronically Signed   By: Misty Stanley M.D.   On: 09/14/2016 09:06     ASSESSMENT AND PLAN:   Active Problems:   Sepsis ([place])  Sepsis present on admission secondary to healthcare associated pneumonia: Continue vancomycin, cefepime, WBC normal, Having the pleuritic pain on the left side, x-ray chest showed a stone pneumonia on the right side. Continue incentive spirometry, antibiotics, monitor closely. says he is feeling better. Blood cultures are negative. WBC normalized. Discontinue IV hydration, continue empiric antibiotics, discharged recently and comes back with pneumonia he needs little longer time this time. Wean off o2. MRSA  screen is negative. Pleuritic chest pain on the left side likely due to muscular skeletal.  Rib Refracture*not seen on chest x-ray.  #2. hypokalemia replaced the potassium, potassium is better.  #4 hepatitis C, cirrhosis, chronic pain syndrome: Patient uses methadone. Continue lactulose, rifaximin. Ammonia level normal. #5. Questionable noncompliance with medicines at home, recently was discharged with Augmentin, Levaquin for pneumonia but not sure if patient took the medicines as prescribed. 6. COPD: Stable no wheezing.continue home dose inhalers. .  #7. chronic pain syndrome; on methadone.  Multiple falls, physical therapy commence home health physical therapy,  All  the records are reviewed and case discussed with Care Management/Social Workerr. Management plans discussed with the patient, family and they are in agreement.  CODE STATUS: full  TOTAL TIME TAKING CARE OF THIS PATIENT: 24minutes.   POSSIBLE D/C IN 1-2DAYS, DEPENDING ON CLINICAL CONDITION.   Epifanio Lesches M.D on 09/14/2016 at 10:54 AM  Between 7am to 6pm - Pager - [phone]  After 6pm go to [email or web address] - password [email or web address] [facility]  Office  [phone]  CC: Primary care physician; Theotis Burrow, MD

## 2016-09-15 LAB — CREATININE, SERUM
CREATININE: 0.6 mg/dL — AB (ref 0.61–1.24)
GFR calc Af Amer: >60 mL/min (ref >60)
GFR calc non Af Amer: >60 mL/min (ref >60)

## 2016-09-15 NOTE — Progress Notes (Signed)
[place] [facility] [place] NAME: [name]    MR#:  [id number]  DATE OF BIRTH:  12-05-1955  SUBJECTIVE:  heard a pop sound in the left rib area for which we treated chest x-ray which did not show any acute fractures. He says he is still having pain in the left rib area more when she he takes deep breath. X-ray shows persistent pneumonia in the right side.. No fever.   CHIEF COMPLAINT:   Chief Complaint  Patient presents with  . Fall    REVIEW OF SYSTEMS:    Review of Systems  Constitutional: Negative for chills and fever.  HENT: Negative for hearing loss.   Eyes: Negative for blurred vision, double vision and photophobia.  Respiratory: Negative for cough, hemoptysis, sputum production and shortness of breath.   Cardiovascular: Positive for chest pain. Negative for palpitations, orthopnea and leg swelling.  Gastrointestinal: Negative for abdominal pain, diarrhea and vomiting.  Genitourinary: Negative for dysuria and urgency.  Musculoskeletal: Negative for myalgias and neck pain.  Skin: Negative for rash.  Neurological: Negative for dizziness, focal weakness, seizures, weakness and headaches.  Psychiatric/Behavioral: Negative for memory loss. The patient does not have insomnia.   c/o of pleuritic chest pain on the left side.  Nutrition:  Tolerating Diet: Tolerating PT:      DRUG ALLERGIES:  No Known Allergies  VITALS:  Blood pressure (!) 141/82, pulse 81, temperature 98.6 F (37 C), temperature source Oral, resp. rate 20, height 5\' 10"  (1.778 m), weight 70.6 kg (155 lb 11.2 oz), SpO2 92 %.  PHYSICAL EXAMINATION:   Physical Exam  GENERAL:  61 y.o.-year-old patient lying in the bed with no acute distress.  EYES: Pupils equal, round, reactive to light and accommodation. No scleral icterus. Extraocular muscles intact.  HEENT:, normocephalic. Oropharynx and nasopharynx clear. Noted to have bruise on left side of the  face NECK:  Supple, no jugular venous distention. No thyroid enlargement, no tenderness.  LUNGS:Clear to auscultation bilaterally, no wheezes, rales.  CARDIOVASCULAR: S1, S2 normal. No murmurs, rubs, or gallops.  ABDOMEN: Soft, nontender, nondistended. Bowel sounds present. No organomegaly or mass.  EXTREMITIES: No pedal edema, cyanosis, or clubbing.  NEUROLOGIC: Cranial nerves II through XII are intact. Muscle strength 5/5 in all extremities. Sensation intact. Gait not checked.  PSYCHIATRIC: The patient is alert and oriented x 3.  SKIN: No obvious rash, lesion, or ulcer.    LABORATORY PANEL:   CBC  Recent Labs Lab 09/10/16 0617  WBC 6.4  HGB 13.0  HCT 37.8*  PLT 117*   ------------------------------------------------------------------------------------------------------------------  Chemistries   Recent Labs Lab 09/09/16 1934  09/11/16 0701 09/15/16 0501  NA 139  < > 138  --   K 3.6  < > 3.8  --   CL 106  < > 108  --   CO2 26  < > 26  --   GLUCOSE 159*  < > 111*  --   BUN 18  < > 12  --   CREATININE 0.77  < > 0.49* 0.60*  CALCIUM 8.5*  < > 8.0*  --   AST 111*  --   --   --   ALT 102*  --   --   --   ALKPHOS 154*  --   --   --   BILITOT 1.5*  --   --   --   < > = values in this interval not displayed. ------------------------------------------------------------------------------------------------------------------  Cardiac Enzymes  Recent Labs Lab 09/09/16 1934  TROPONINI <0.03   ------------------------------------------------------------------------------------------------------------------  RADIOLOGY:  Dg Chest 1 View  Result Date: 09/14/2016 CLINICAL DATA:  Multiple falls. EXAM: CHEST 1 VIEW COMPARISON:  07/02/201806/26/2018.  07/15/2016. FINDINGS: Right suprahilar opacity, new since 07/15/2016 is stable since 09/09/2016. This likely reflects collapse/consolidation. Neoplasm considered less likely given this was not present 2 months ago. There is also  collapse/ consolidation at the right base new since the study from 2 months ago with small right pleural effusion. The right base airspace disease in right effusion is new since 09/09/2016. Left lung remains clear. The cardiopericardial silhouette is within normal limits for size. The visualized bony structures of the thorax are intact. IMPRESSION: Progressive airspace disease in the right lower lung with new small right pleural effusion. Right suprahilar opacity is similar to the study from 5 days ago. Given these changes are new since 07/15/2016 and markedly progressive since 09/03/2016, infectious/inflammatory etiology is favored, however close continued follow-up recommended. Electronically Signed   By: Misty Stanley M.D.   On: 09/14/2016 09:06     ASSESSMENT AND PLAN:   Active Problems:   Sepsis ([place])  Sepsis present on admission secondary to healthcare associated pneumonia: Continue vancomycin, cefepime, WBC normal, Having the pleuritic pain on the left side, x-ray chest showed a stone pneumonia on the right side. Continue incentive spirometry, antibiotics, monitor closely. says he is feeling better. Blood cultures are negative. WBC normalized. Discontinue IV hydration, continue empiric antibiotics, discharged recently and comes back with pneumonia he needs little longer time this time. Wean off o2. MRSA  screen is negative. Pleuritic chest pain on the left side likely due to muscular skeletal.  Rib fracture*not seen on chest x-ray.  #2. hypokalemia replaced the potassium, potassium is better.  #4 hepatitis C, cirrhosis, chronic pain syndrome: Patient uses methadone. Continue lactulose, rifaximin. Ammonia level normal. #5. Questionable noncompliance with medicines at home, recently was discharged with Augmentin, Levaquin for pneumonia but not sure if patient took the medicines as prescribed. 6. COPD: Stable no wheezing.continue home dose inhalers. .  #7. chronic pain syndrome; on methadone.   Multiple falls, physical therapy commence home health physical therapy, disCharge him tomorrow. All the records are reviewed and case discussed with Care Management/Social Workerr. Management plans discussed with the patient, family and they are in agreement.  CODE STATUS: full  TOTAL TIME TAKING CARE OF THIS PATIENT: 66minutes.   POSSIBLE D/C IN 1-2DAYS, DEPENDING ON CLINICAL CONDITION.   Epifanio Lesches M.D on 09/15/2016 at 10:30 AM  Between 7am to 6pm - Pager - [phone]  After 6pm go to [email or web address] - password [email or web address] [facility]  Office  [phone]  CC: Primary care physician; Theotis Burrow, MD

## 2016-09-16 MED ORDER — LEVOFLOXACIN 750 MG PO TABS: 750.0000 mg | ORAL_TABLET | Freq: Every day | ORAL | 0 refills | Status: AC

## 2016-09-16 NOTE — Progress Notes (Signed)
Pharmacy Antibiotic Note  [name] is a 61 y.o. male admitted on 09/09/2016 with sepsis.  Pharmacy has been consulted for Cefepime   Plan: Will continue cefepime 1g IV q8h.  Height: 5\' 10"  (177.8 cm) Weight: 155 lb 11.2 oz (70.6 kg) IBW/kg (Calculated) : 73  Temp (24hrs), Avg:98.3 F (36.8 C), Min:98 F (36.7 C), Max:98.6 F (37 C)   Recent Labs Lab 09/09/16 1934 09/09/16 2247 09/10/16 0617 09/11/16 0605 09/11/16 0701 09/11/16 1339 09/15/16 0501  WBC 7.3  --  6.4  --   --   --   --   CREATININE 0.77  --  0.67 0.54* 0.49*  --  0.60*  LATICACIDVEN 3.5* 1.3  --   --   --   --   --   VANCOTROUGH  --   --   --  30*  --  15  --     Estimated Creatinine Clearance: 96.8 mL/min (A) (by C-G formula based on SCr of 0.6 mg/dL (L)).    No Known Allergies   Thank you for allowing pharmacy to be a part of this patient's care.  Larene Beach, PharmD  Clinical Pharmacist  09/16/2016

## 2016-09-16 NOTE — Progress Notes (Signed)
[place] [facility] [place] NAME: [name]    MR#:  [id number]  DATE OF BIRTH:  [birth year]  SUBJECTIVE:  Stable for discharge with antibiotics for pneumonia. 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   CHIEF COMPLAINT:   Chief Complaint  Patient presents with  . Fall    REVIEW OF SYSTEMS:    Review of Systems  Constitutional: Negative for chills and fever.  HENT: Negative for hearing loss.   Eyes: Negative for blurred vision, double vision and photophobia.  Respiratory: Negative for cough, hemoptysis, sputum production and shortness of breath.   Cardiovascular: Negative for chest pain, palpitations, orthopnea and leg swelling.  Gastrointestinal: Negative for abdominal pain, diarrhea and vomiting.  Genitourinary: Negative for dysuria and urgency.  Musculoskeletal: Negative for myalgias and neck pain.  Skin: Negative for rash.  Neurological: Negative for dizziness, focal weakness, seizures, weakness and headaches.  Psychiatric/Behavioral: Negative for memory loss. The patient does not have insomnia.   c/o of pleuritic chest pain on the left side.  Nutrition:  Tolerating Diet: Tolerating PT:      DRUG ALLERGIES:  No Known Allergies  VITALS:  Blood pressure (!) 145/71, pulse 71, temperature 98.6 F (37 C), temperature source Oral, resp. rate 20, height 5\' 10"  (1.778 m), weight 70.6 kg (155 lb 11.2 oz), SpO2 91 %.  PHYSICAL EXAMINATION:   Physical Exam  GENERAL:  61 y.o.-year-old patient lying in the bed with no acute distress.  EYES: Pupils equal, round, reactive to light and accommodation. No scleral icterus. Extraocular muscles intact.  HEENT:, normocephalic. Oropharynx and nasopharynx clear. Noted to have bruise on left side of the face NECK:  Supple, no jugular venous distention. No thyroid enlargement, no tenderness.  LUNGS:Clear to auscultation bilaterally, no wheezes, rales.  CARDIOVASCULAR: S1, S2 normal. No murmurs, rubs,  or gallops.  ABDOMEN: Soft, nontender, nondistended. Bowel sounds present. No organomegaly or mass.  EXTREMITIES: No pedal edema, cyanosis, or clubbing.  NEUROLOGIC: Cranial nerves II through XII are intact. Muscle strength 5/5 in all extremities. Sensation intact. Gait not checked.  PSYCHIATRIC: The patient is alert and oriented x 3.  SKIN: No obvious rash, lesion, or ulcer.    LABORATORY PANEL:   CBC  Recent Labs Lab 09/10/16 0617  WBC 6.4  HGB 13.0  HCT 37.8*  PLT 117*   ------------------------------------------------------------------------------------------------------------------  Chemistries   Recent Labs Lab 09/09/16 1934  09/11/16 0701 09/15/16 0501  NA 139  < > 138  --   K 3.6  < > 3.8  --   CL 106  < > 108  --   CO2 26  < > 26  --   GLUCOSE 159*  < > 111*  --   BUN 18  < > 12  --   CREATININE 0.77  < > 0.49* 0.60*  CALCIUM 8.5*  < > 8.0*  --   AST 111*  --   --   --   ALT 102*  --   --   --   ALKPHOS 154*  --   --   --   BILITOT 1.5*  --   --   --   < > = values in this interval not displayed. ------------------------------------------------------------------------------------------------------------------  Cardiac Enzymes  Recent Labs Lab 09/09/16 1934  TROPONINI <0.03   ------------------------------------------------------------------------------------------------------------------  RADIOLOGY:  No results found.   ASSESSMENT AND PLAN:   Active Problems:   Sepsis ([place])  Sepsis present on admission secondary to healthcare associated pneumonia:  Discharge home with [facility]. Discharging home with nurse to make sure she is complaint with theanti biotics. Pleuritic chest pain on the left side likely due to muscular skeletal.  Rib fracture*not seen on chest x-ray.  #2. hypokalemia replaced the potassium, potassium is better.   #4 hepatitis C, cirrhosis, chronic pain syndrome: Patient uses methadone. Continue lactulose, rifaximin. Ammonia  level normal.  #5. Questionable noncompliance with medicines at home,  recently was discharged with Augmentin, Levaquin for pneumonia but not sure if patient took the medicines as prescribed. Discharging him with this time with the health nursing. 6. COPD: Stable no wheezing.continue home dose inhalers. .  #7. chronic pain syndrome; on methadone.  Multiple falls, physical therapy commence home health physical therapy,  All the records are reviewed and case discussed with Care Management/Social Workerr. Management plans discussed with the patient, family and they are in agreement.  CODE STATUS: full  TOTAL TIME TAKING CARE OF THIS PATIENT: 58minutes.   Discharge home today. [name] M.D on 09/16/2016 at 12:26 PM  Between 7am to 6pm - Pager - [phone]  After 6pm go to [email or web address] - password [email or web address] [facility]  Office  [phone]  CC: Primary care physician; Tommy Burrow, MD

## 2016-09-16 NOTE — Progress Notes (Signed)
Spoke with legal guardian, Allison Silva. Guardian okay with pt returning home via pts sister. Requested that paperwork be reviewed with pts sister. All questions answered to satisfaction. IV removed.
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
## 2016-10-14 ENCOUNTER — Ambulatory Visit: Admission: RE | Admit: 2016-10-14 | Payer: Medicaid Other | Source: Ambulatory Visit | Admitting: Gastroenterology

## 2016-10-14 ENCOUNTER — Encounter: Admission: RE | Payer: Self-pay | Source: Ambulatory Visit

## 2016-10-14 SURGERY — ESOPHAGOGASTRODUODENOSCOPY (EGD) WITH PROPOFOL
Anesthesia: General

## 2016-11-07 ENCOUNTER — Other Ambulatory Visit: Payer: Self-pay | Admitting: Gastroenterology

## 2016-11-07 DIAGNOSIS — R945 Abnormal results of liver function studies: Principal | ICD-10-CM

## 2016-11-07 DIAGNOSIS — R7989 Other specified abnormal findings of blood chemistry: Secondary | ICD-10-CM

## 2016-11-13 ENCOUNTER — Other Ambulatory Visit
Admission: RE | Admit: 2016-11-13 | Discharge: 2016-11-13 | Disposition: A | Discharge status: Home / Self Care | Payer: Medicaid Other | Source: Ambulatory Visit | Attending: Gastroenterology | Admitting: Gastroenterology

## 2016-11-13 ENCOUNTER — Other Ambulatory Visit: Payer: Self-pay | Admitting: Radiology

## 2016-11-13 DIAGNOSIS — R945 Abnormal results of liver function studies: Secondary | ICD-10-CM | POA: Diagnosis present

## 2016-11-13 LAB — APTT: APTT: 27 s (ref 24–36)

## 2016-11-14 ENCOUNTER — Other Ambulatory Visit: Payer: Self-pay | Admitting: General Surgery

## 2016-11-15 ENCOUNTER — Other Ambulatory Visit: Payer: Self-pay | Admitting: Gastroenterology

## 2016-11-15 ENCOUNTER — Ambulatory Visit
Admission: RE | Admit: 2016-11-15 | Discharge: 2016-11-15 | Disposition: A | Discharge status: Home / Self Care | Payer: Medicaid Other | Source: Ambulatory Visit | Attending: Gastroenterology | Admitting: Gastroenterology

## 2016-11-15 DIAGNOSIS — R7989 Other specified abnormal findings of blood chemistry: Secondary | ICD-10-CM | POA: Diagnosis present

## 2016-11-15 DIAGNOSIS — R945 Abnormal results of liver function studies: Secondary | ICD-10-CM

## 2016-11-15 HISTORY — DX: Dyspnea, unspecified: R06.00

## 2016-11-15 HISTORY — DX: Gastro-esophageal reflux disease without esophagitis: K21.9

## 2016-11-15 MED ORDER — FENTANYL CITRATE (PF) 100 MCG/2ML IJ SOLN
INTRAMUSCULAR | Status: AC
  Filled 2016-11-15: qty 4

## 2016-11-15 MED ORDER — SODIUM CHLORIDE 0.9 % IV SOLN
INTRAVENOUS | Status: DC
  Administered 2016-11-15: 11:00:00 via INTRAVENOUS

## 2016-11-15 MED ORDER — FENTANYL CITRATE (PF) 100 MCG/2ML IJ SOLN
INTRAMUSCULAR | Status: AC | PRN
  Administered 2016-11-15: 25 ug via INTRAVENOUS
  Administered 2016-11-15: 50 ug via INTRAVENOUS

## 2016-11-15 MED ORDER — MIDAZOLAM HCL 5 MG/5ML IJ SOLN
INTRAMUSCULAR | Status: AC | PRN
  Administered 2016-11-15 (×2): 1 mg via INTRAVENOUS

## 2016-11-15 MED ORDER — MIDAZOLAM HCL 5 MG/5ML IJ SOLN
INTRAMUSCULAR | Status: AC
  Filled 2016-11-15: qty 5
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
## 2016-11-15 NOTE — Procedures (Signed)
Pre Procedure Dx: Elevated LFTs Post Procedural Dx: Same  Technically successful US guided biopsy of right lobe of the liver.  EBL: None  No immediate complications.   Jay Neave Lenger, MD Pager #: [phone]    

## 2016-11-18 LAB — SURGICAL PATHOLOGY

## 2016-12-21 ENCOUNTER — Encounter: Payer: Self-pay | Admitting: Internal Medicine

## 2016-12-21 ENCOUNTER — Emergency Department: Payer: Medicaid Other

## 2016-12-21 ENCOUNTER — Observation Stay
Admission: EM | Admit: 2016-12-21 | Discharge: 2016-12-23 | Disposition: A | Discharge status: Home / Self Care | Payer: Medicaid Other | Attending: Internal Medicine | Admitting: Internal Medicine

## 2016-12-21 DIAGNOSIS — K7460 Unspecified cirrhosis of liver: Secondary | ICD-10-CM | POA: Insufficient documentation

## 2016-12-21 DIAGNOSIS — R197 Diarrhea, unspecified: Secondary | ICD-10-CM | POA: Diagnosis not present

## 2016-12-21 DIAGNOSIS — F419 Anxiety disorder, unspecified: Secondary | ICD-10-CM | POA: Diagnosis not present

## 2016-12-21 DIAGNOSIS — Y92003 Bedroom of unspecified non-institutional (private) residence as the place of occurrence of the external cause: Secondary | ICD-10-CM | POA: Diagnosis not present

## 2016-12-21 DIAGNOSIS — M1990 Unspecified osteoarthritis, unspecified site: Secondary | ICD-10-CM | POA: Insufficient documentation

## 2016-12-21 DIAGNOSIS — R296 Repeated falls: Secondary | ICD-10-CM

## 2016-12-21 DIAGNOSIS — F329 Major depressive disorder, single episode, unspecified: Secondary | ICD-10-CM | POA: Insufficient documentation

## 2016-12-21 DIAGNOSIS — W19XXXA Unspecified fall, initial encounter: Secondary | ICD-10-CM | POA: Diagnosis not present

## 2016-12-21 DIAGNOSIS — G8929 Other chronic pain: Secondary | ICD-10-CM | POA: Diagnosis not present

## 2016-12-21 DIAGNOSIS — M549 Dorsalgia, unspecified: Secondary | ICD-10-CM | POA: Diagnosis not present

## 2016-12-21 DIAGNOSIS — Z7951 Long term (current) use of inhaled steroids: Secondary | ICD-10-CM | POA: Diagnosis not present

## 2016-12-21 DIAGNOSIS — J449 Chronic obstructive pulmonary disease, unspecified: Secondary | ICD-10-CM | POA: Insufficient documentation

## 2016-12-21 DIAGNOSIS — F1410 Cocaine abuse, uncomplicated: Secondary | ICD-10-CM | POA: Diagnosis not present

## 2016-12-21 DIAGNOSIS — R0602 Shortness of breath: Secondary | ICD-10-CM

## 2016-12-21 DIAGNOSIS — R338 Other retention of urine: Secondary | ICD-10-CM | POA: Insufficient documentation

## 2016-12-21 DIAGNOSIS — K7200 Acute and subacute hepatic failure without coma: Secondary | ICD-10-CM | POA: Diagnosis present

## 2016-12-21 DIAGNOSIS — Z9119 Patient's noncompliance with other medical treatment and regimen: Secondary | ICD-10-CM | POA: Insufficient documentation

## 2016-12-21 DIAGNOSIS — K7682 Hepatic encephalopathy: Secondary | ICD-10-CM

## 2016-12-21 DIAGNOSIS — K7290 Hepatic failure, unspecified without coma: Secondary | ICD-10-CM | POA: Diagnosis not present

## 2016-12-21 DIAGNOSIS — F1910 Other psychoactive substance abuse, uncomplicated: Secondary | ICD-10-CM | POA: Diagnosis not present

## 2016-12-21 DIAGNOSIS — I10 Essential (primary) hypertension: Secondary | ICD-10-CM | POA: Diagnosis not present

## 2016-12-21 DIAGNOSIS — N401 Enlarged prostate with lower urinary tract symptoms: Secondary | ICD-10-CM | POA: Insufficient documentation

## 2016-12-21 DIAGNOSIS — G92 Toxic encephalopathy: Secondary | ICD-10-CM | POA: Diagnosis not present

## 2016-12-21 DIAGNOSIS — Z87891 Personal history of nicotine dependence: Secondary | ICD-10-CM | POA: Insufficient documentation

## 2016-12-21 DIAGNOSIS — S92515A Nondisplaced fracture of proximal phalanx of left lesser toe(s), initial encounter for closed fracture: Secondary | ICD-10-CM | POA: Diagnosis not present

## 2016-12-21 DIAGNOSIS — K219 Gastro-esophageal reflux disease without esophagitis: Secondary | ICD-10-CM | POA: Diagnosis not present

## 2016-12-21 DIAGNOSIS — B182 Chronic viral hepatitis C: Secondary | ICD-10-CM | POA: Insufficient documentation

## 2016-12-21 DIAGNOSIS — Z9981 Dependence on supplemental oxygen: Secondary | ICD-10-CM | POA: Diagnosis not present

## 2016-12-21 DIAGNOSIS — S92502A Displaced unspecified fracture of left lesser toe(s), initial encounter for closed fracture: Secondary | ICD-10-CM

## 2016-12-21 DIAGNOSIS — R55 Syncope and collapse: Secondary | ICD-10-CM

## 2016-12-21 DIAGNOSIS — E119 Type 2 diabetes mellitus without complications: Secondary | ICD-10-CM | POA: Insufficient documentation

## 2016-12-21 LAB — URINALYSIS, COMPLETE (UACMP) WITH MICROSCOPIC
BACTERIA UA: NONE SEEN
BILIRUBIN URINE: NEGATIVE
GLUCOSE, UA: NEGATIVE mg/dL
HGB URINE DIPSTICK: NEGATIVE
KETONES UR: 5 mg/dL — AB
LEUKOCYTES UA: NEGATIVE
NITRITE: NEGATIVE
PROTEIN: NEGATIVE mg/dL
Specific Gravity, Urine: 1.018 (ref 1.005–1.030)
pH: 6 (ref 5.0–8.0)

## 2016-12-21 LAB — COMPREHENSIVE METABOLIC PANEL
ALBUMIN: 3.3 g/dL — AB (ref 3.5–5.0)
ALT: 63 U/L (ref 17–63)
ANION GAP: 7 (ref 5–15)
AST: 95 U/L — ABNORMAL HIGH (ref 15–41)
Alkaline Phosphatase: 125 U/L (ref 38–126)
BUN: 17 mg/dL (ref 6–20)
CHLORIDE: 112 mmol/L — AB (ref 101–111)
CO2: 23 mmol/L (ref 22–32)
CREATININE: 0.84 mg/dL (ref 0.61–1.24)
Calcium: 8.5 mg/dL — ABNORMAL LOW (ref 8.9–10.3)
GFR calc non Af Amer: >60 mL/min (ref >60)
GLUCOSE: 140 mg/dL — AB (ref 65–99)
Potassium: 3.5 mmol/L (ref 3.5–5.1)
SODIUM: 142 mmol/L (ref 135–145)
Total Bilirubin: 1.5 mg/dL — ABNORMAL HIGH (ref 0.3–1.2)
Total Protein: 6.9 g/dL (ref 6.5–8.1)

## 2016-12-21 LAB — CBC WITH DIFFERENTIAL/PLATELET
BASOS ABS: 0 10*3/uL (ref 0–0.1)
Basophils Relative: 0 %
Eosinophils Absolute: 0.2 10*3/uL (ref 0–0.7)
Eosinophils Relative: 3 %
HEMATOCRIT: 37.5 % — AB (ref 40.0–52.0)
HEMOGLOBIN: 12.8 g/dL — AB (ref 13.0–18.0)
LYMPHS ABS: 1.2 10*3/uL (ref 1.0–3.6)
LYMPHS PCT: 23 %
MCH: 33.6 pg (ref 26.0–34.0)
MCHC: 34.2 g/dL (ref 32.0–36.0)
MCV: 98.1 fL (ref 80.0–100.0)
MONO ABS: 0.7 10*3/uL (ref 0.2–1.0)
Monocytes Relative: 13 %
NEUTROS PCT: 61 %
Neutro Abs: 3.1 10*3/uL (ref 1.4–6.5)
Platelets: 106 10*3/uL — ABNORMAL LOW (ref 150–440)
RBC: 3.82 MIL/uL — ABNORMAL LOW (ref 4.40–5.90)
RDW: 13.8 % (ref 11.5–14.5)
WBC: 5.2 10*3/uL (ref 3.8–10.6)

## 2016-12-21 LAB — AMMONIA: AMMONIA: 51 umol/L — AB (ref 9–35)

## 2016-12-21 LAB — TROPONIN I
Troponin I: 0.04 ng/mL (ref <0.03)
Troponin I: <0.03 ng/mL (ref <0.03)

## 2016-12-21 LAB — PROTIME-INR
INR: 1.35
Prothrombin Time: 16.6 seconds — ABNORMAL HIGH (ref 11.4–15.2)

## 2016-12-21 MED ORDER — TIOTROPIUM BROMIDE MONOHYDRATE 18 MCG IN CAPS
18.0000 ug | ORAL_CAPSULE | Freq: Every day | RESPIRATORY_TRACT | Status: DC
  Administered 2016-12-22 – 2016-12-23 (×2): 18 ug via RESPIRATORY_TRACT
  Filled 2016-12-21: qty 5

## 2016-12-21 MED ORDER — DOCUSATE SODIUM 100 MG PO CAPS
100.0000 mg | ORAL_CAPSULE | Freq: Two times a day (BID) | ORAL | Status: DC
  Administered 2016-12-21 – 2016-12-23 (×3): 100 mg via ORAL
  Filled 2016-12-21 (×3): qty 1

## 2016-12-21 MED ORDER — ACETAMINOPHEN 325 MG PO TABS: 650.0000 mg | ORAL_TABLET | Freq: Four times a day (QID) | ORAL | Status: DC | PRN

## 2016-12-21 MED ORDER — BISACODYL 10 MG RE SUPP: 10.0000 mg | Freq: Every day | RECTAL | Status: DC | PRN

## 2016-12-21 MED ORDER — LACTULOSE 10 GM/15ML PO SOLN
30.0000 g | Freq: Three times a day (TID) | ORAL | Status: DC
  Administered 2016-12-22 – 2016-12-23 (×3): 30 g via ORAL
  Filled 2016-12-21 (×6): qty 60

## 2016-12-21 MED ORDER — ACETAMINOPHEN 650 MG RE SUPP: 650.0000 mg | Freq: Four times a day (QID) | RECTAL | Status: DC | PRN

## 2016-12-21 MED ORDER — LORAZEPAM 2 MG/ML IJ SOLN: 1.0000 mg | INTRAMUSCULAR | Status: DC | PRN

## 2016-12-21 MED ORDER — METHADONE HCL 10 MG PO TABS
20.0000 mg | ORAL_TABLET | Freq: Three times a day (TID) | ORAL | Status: DC
  Administered 2016-12-21 – 2016-12-22 (×3): 20 mg via ORAL
  Filled 2016-12-21 (×3): qty 2

## 2016-12-21 MED ORDER — CLONIDINE HCL 0.1 MG PO TABS
0.2000 mg | ORAL_TABLET | Freq: Three times a day (TID) | ORAL | Status: DC
  Administered 2016-12-21 (×2): 0.2 mg via ORAL
  Filled 2016-12-21 (×2): qty 2

## 2016-12-21 MED ORDER — POTASSIUM CHLORIDE IN NACL 20-0.45 MEQ/L-% IV SOLN
INTRAVENOUS | Status: DC
  Administered 2016-12-21 – 2016-12-22 (×2): via INTRAVENOUS
  Filled 2016-12-21 (×3): qty 1000

## 2016-12-21 MED ORDER — ONDANSETRON HCL 4 MG PO TABS: 4.0000 mg | ORAL_TABLET | Freq: Four times a day (QID) | ORAL | Status: DC | PRN

## 2016-12-21 MED ORDER — ALPRAZOLAM 1 MG PO TABS
1.0000 mg | ORAL_TABLET | Freq: Three times a day (TID) | ORAL | Status: DC
  Administered 2016-12-21 – 2016-12-23 (×5): 1 mg via ORAL
  Filled 2016-12-21: qty 2
  Filled 2016-12-21 (×5): qty 1

## 2016-12-21 MED ORDER — ONDANSETRON HCL 4 MG/2ML IJ SOLN: 4.0000 mg | Freq: Four times a day (QID) | INTRAMUSCULAR | Status: DC | PRN

## 2016-12-21 MED ORDER — IPRATROPIUM-ALBUTEROL 0.5-2.5 (3) MG/3ML IN SOLN
3.0000 mL | Freq: Four times a day (QID) | RESPIRATORY_TRACT | Status: DC
  Administered 2016-12-21 – 2016-12-22 (×2): 3 mL via RESPIRATORY_TRACT
  Filled 2016-12-21 (×2): qty 3

## 2016-12-21 MED ORDER — LACTULOSE 10 GM/15ML PO SOLN: 30.0000 g | Freq: Once | ORAL | Status: DC

## 2016-12-21 MED ORDER — HYDRALAZINE HCL 20 MG/ML IJ SOLN: 10.0000 mg | INTRAMUSCULAR | Status: DC | PRN

## 2016-12-21 MED ORDER — RIFAXIMIN 550 MG PO TABS
550.0000 mg | ORAL_TABLET | Freq: Two times a day (BID) | ORAL | Status: DC
  Administered 2016-12-21 – 2016-12-23 (×5): 550 mg via ORAL
  Filled 2016-12-21 (×6): qty 1

## 2016-12-21 MED ORDER — LACTULOSE 10 GM/15ML PO SOLN
45.0000 g | Freq: Once | ORAL | Status: AC
Start: 2016-12-21 — End: 2016-12-21
  Administered 2016-12-21: 45 g via ORAL

## 2016-12-21 MED ORDER — PANTOPRAZOLE SODIUM 40 MG IV SOLR
40.0000 mg | Freq: Two times a day (BID) | INTRAVENOUS | Status: DC
  Administered 2016-12-21 – 2016-12-23 (×5): 40 mg via INTRAVENOUS
  Filled 2016-12-21 (×5): qty 40

## 2016-12-21 MED ORDER — HEPARIN SODIUM (PORCINE) 5000 UNIT/ML IJ SOLN
5000.0000 [IU] | Freq: Three times a day (TID) | INTRAMUSCULAR | Status: DC
  Administered 2016-12-21 – 2016-12-23 (×5): 5000 [IU] via SUBCUTANEOUS
  Filled 2016-12-21 (×5): qty 1

## 2016-12-21 MED ORDER — MOMETASONE FURO-FORMOTEROL FUM 200-5 MCG/ACT IN AERO
2.0000 | INHALATION_SPRAY | Freq: Two times a day (BID) | RESPIRATORY_TRACT | Status: DC
  Administered 2016-12-22 – 2016-12-23 (×3): 2 via RESPIRATORY_TRACT
  Filled 2016-12-21: qty 8.8

## 2016-12-21 NOTE — ED Notes (Signed)
Pt states he has been an IV drug user for 20 years.  This RN attempted an IV in the [facility] and was unsuccessful.

## 2016-12-21 NOTE — ED Triage Notes (Addendum)
Pt arrived via EMS from home with complaints of a fall. Pt denies hitting his head. Pt complaining of abdominal region hurting and pt is continuously picking at scabs on arms and legs. Pt has a powdery/pink substance all over body. EMS stated that mom called them because he fell and she couldn't get him up by herself. She says that he "acts like this when he takes too many xannies". VS per EMS were BP-160/90 HR-107 O2sat-95% RA BS-141. Pt has Hx of Diabetes and psoriasis of the liver. EMS stated that pt took some Metformin. Pt has bruising inner/outer left thigh, and finger print bruising on this abdomin and left arm. PT states that he takes "Xanax and Methasone every day".
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
## 2016-12-21 NOTE — ED Notes (Signed)
Manuela Schwartz, RN aware of bed assigned

## 2016-12-21 NOTE — ED Notes (Signed)
Report called to 1c - pt ready for transport.
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
## 2016-12-21 NOTE — Clinical Social Work Note (Signed)
CSW received consult for SNF placement. CSW will assess pending PT recommendations.   Santiago Bumpers, MSW, Latanya Presser [phone]

## 2016-12-21 NOTE — ED Notes (Addendum)
Report received. Pt to be admitted and bed is not assigned at this time. Pt previously medicated as dr ordered.

## 2016-12-22 ENCOUNTER — Inpatient Hospital Stay: Payer: Medicaid Other

## 2016-12-22 DIAGNOSIS — K7290 Hepatic failure, unspecified without coma: Secondary | ICD-10-CM | POA: Diagnosis not present

## 2016-12-22 DIAGNOSIS — F1410 Cocaine abuse, uncomplicated: Secondary | ICD-10-CM | POA: Diagnosis present

## 2016-12-22 DIAGNOSIS — F419 Anxiety disorder, unspecified: Secondary | ICD-10-CM | POA: Diagnosis not present

## 2016-12-22 LAB — AMMONIA: AMMONIA: 60 umol/L — AB (ref 9–35)

## 2016-12-22 LAB — COMPREHENSIVE METABOLIC PANEL
ALT: 53 U/L (ref 17–63)
ANION GAP: 8 (ref 5–15)
AST: 77 U/L — ABNORMAL HIGH (ref 15–41)
Albumin: 2.7 g/dL — ABNORMAL LOW (ref 3.5–5.0)
Alkaline Phosphatase: 93 U/L (ref 38–126)
BUN: 11 mg/dL (ref 6–20)
CALCIUM: 8 mg/dL — AB (ref 8.9–10.3)
CHLORIDE: 107 mmol/L (ref 101–111)
CO2: 24 mmol/L (ref 22–32)
CREATININE: 0.81 mg/dL (ref 0.61–1.24)
GFR calc non Af Amer: >60 mL/min (ref >60)
Glucose, Bld: 100 mg/dL — ABNORMAL HIGH (ref 65–99)
Potassium: 3.1 mmol/L — ABNORMAL LOW (ref 3.5–5.1)
Sodium: 139 mmol/L (ref 135–145)
Total Bilirubin: 2.1 mg/dL — ABNORMAL HIGH (ref 0.3–1.2)
Total Protein: 5.6 g/dL — ABNORMAL LOW (ref 6.5–8.1)

## 2016-12-22 LAB — URINE DRUG SCREEN, QUALITATIVE (ARMC ONLY)
Amphetamines, Ur Screen: NOT DETECTED
BARBITURATES, UR SCREEN: NOT DETECTED
BENZODIAZEPINE, UR SCRN: POSITIVE — AB
Cannabinoid 50 Ng, Ur ~~LOC~~: NOT DETECTED
Cocaine Metabolite,Ur ~~LOC~~: POSITIVE — AB
MDMA (Ecstasy)Ur Screen: NOT DETECTED
METHADONE SCREEN, URINE: POSITIVE — AB
Opiate, Ur Screen: NOT DETECTED
Phencyclidine (PCP) Ur S: NOT DETECTED
TRICYCLIC, UR SCREEN: NOT DETECTED

## 2016-12-22 LAB — CBC
HCT: 36.2 % — ABNORMAL LOW (ref 40.0–52.0)
Hemoglobin: 12.4 g/dL — ABNORMAL LOW (ref 13.0–18.0)
MCH: 34.1 pg — ABNORMAL HIGH (ref 26.0–34.0)
MCHC: 34.2 g/dL (ref 32.0–36.0)
MCV: 99.6 fL (ref 80.0–100.0)
PLATELETS: 91 10*3/uL — AB (ref 150–440)
RBC: 3.63 MIL/uL — AB (ref 4.40–5.90)
RDW: 13.7 % (ref 11.5–14.5)
WBC: 4.4 10*3/uL (ref 3.8–10.6)

## 2016-12-22 LAB — GLUCOSE, CAPILLARY: Glucose-Capillary: 96 mg/dL (ref 65–99)

## 2016-12-22 MED ORDER — IPRATROPIUM-ALBUTEROL 0.5-2.5 (3) MG/3ML IN SOLN
3.0000 mL | Freq: Four times a day (QID) | RESPIRATORY_TRACT | Status: DC | PRN
Start: 2016-12-22 — End: 2016-12-23

## 2016-12-22 MED ORDER — SODIUM CHLORIDE 0.9 % IV BOLUS (SEPSIS)
250.0000 mL | Freq: Once | INTRAVENOUS | Status: AC
  Administered 2016-12-22: 250 mL via INTRAVENOUS

## 2016-12-22 MED ORDER — IPRATROPIUM-ALBUTEROL 0.5-2.5 (3) MG/3ML IN SOLN: 3.0000 mL | Freq: Two times a day (BID) | RESPIRATORY_TRACT | Status: DC

## 2016-12-22 MED ORDER — TAMSULOSIN HCL 0.4 MG PO CAPS
0.4000 mg | ORAL_CAPSULE | Freq: Every day | ORAL | Status: DC
  Administered 2016-12-22 – 2016-12-23 (×2): 0.4 mg via ORAL
  Filled 2016-12-22 (×2): qty 1

## 2016-12-22 MED ORDER — CLONIDINE HCL 0.1 MG PO TABS: 0.1000 mg | ORAL_TABLET | Freq: Three times a day (TID) | ORAL | Status: DC

## 2016-12-22 MED ORDER — METHADONE HCL 10 MG PO TABS
20.0000 mg | ORAL_TABLET | Freq: Three times a day (TID) | ORAL | Status: DC
  Administered 2016-12-22 – 2016-12-23 (×3): 20 mg via ORAL
  Filled 2016-12-22 (×3): qty 2

## 2016-12-22 NOTE — Discharge Instructions (Signed)
Resume diet and activity as before ° ° °
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
## 2016-12-22 NOTE — Progress Notes (Signed)
Post-op shoe delivered and placed on pt's L foot.

## 2016-12-22 NOTE — Progress Notes (Signed)
Pt complaining about pain to his rib cage that is chronic and is currently at an 8/10. He does not wish to try tylenol and is asking for his scheduled methadone. His early morning dose was held due to hypotension per mar notes. His current BP is still soft at 92/54 (64). Dr. Darvin Neighbours paged, he stated that the pt can have his methadone now. Also, pt is stating he is having a difficult time starting a stream of urine. He tells me he takes a medication for his prostate at home, but is unable to recall the name of it. I can not identify a medication on his PTA list that he may be taking to help with urination. Dr. Darvin Neighbours also aware, and he stated he will look at ordering a medication to help the pt with starting a stream of urine.

## 2016-12-22 NOTE — Progress Notes (Signed)
Pt to be discharged today, but has no ride to go home. His sister states she will be able to come get him tomorrow. Dr. Darvin Neighbours aware and states he can go once his sister is able to come get him.

## 2016-12-22 NOTE — Progress Notes (Signed)
I spoke to [place] the case Freight forwarder and she stated that his discharge order will need to be discontinued if he is going to stay past midnight. Dr. Darvin Neighbours notified and he stated to discontinue discharge order. He also stated that the q4 neuro check order could be discontinued as well. Orders updated in epic.

## 2016-12-22 NOTE — Progress Notes (Signed)
Due to pt's difficulty with starting a stream I performed a bladder scan that showed 830mls of urine in his bladder. I asked if he would like me to ask the doctor about performing and in and out catheterization to empty his bladder, but he stated he would rather first try to void on his own. He was able to void 313mls of urine and stated he feels like his bladder emptied. Dr. Darvin Neighbours also added flomax to pt's medication list. Flomax just given to pt. He stated he would like to see if the flomax helps before attempting catheterization.

## 2016-12-22 NOTE — Progress Notes (Addendum)
After voiding, bladder scan performed and 58mls of urine was seen in bladder. Pt denies pain or pressure to bladder/abdomen even after I palpated his lower abdomen. I explained to him that he is not emptying his bladder fully which places him at risk for potential infection, renal damage, and possible bladder rupture which can be lethal. Dr. Darvin Neighbours notified via text page system and he asked that I perform an in & out catheterization. I explained this to the pt and he is refusing to allow me to perform an in & catheterization. When asked why he states that he is urinating, I re-educated him and explained that while he is urinating, his bladder is not emptying completely, and this will put him at risk for the negative outcomes listed above. He is still not willing to allow me to perform an in&out catheterization. Will continue to encourage him to allow a one-time in and out catheterization.   Update 1750: Pt agreed to allow me to perform in & out catheterization. This was performed and 774mls of urine obtained from in&out. Dr. Darvin Neighbours called and he stated to check bladder scan next time he voids and if he has >562mls of urine to place foley catheter.
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
## 2016-12-22 NOTE — Progress Notes (Signed)
Pt with BP 94/45 this am. Order obtained for 244ml NS bolus X1 . After bolus BP remained low. MD made aware. NO new orders. Continue to monitor.
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
## 2016-12-23 DIAGNOSIS — K7682 Hepatic encephalopathy: Secondary | ICD-10-CM | POA: Diagnosis present

## 2016-12-23 DIAGNOSIS — K7200 Acute and subacute hepatic failure without coma: Secondary | ICD-10-CM | POA: Diagnosis present

## 2016-12-23 LAB — GLUCOSE, CAPILLARY: GLUCOSE-CAPILLARY: 117 mg/dL — AB (ref 65–99)

## 2016-12-23 MED ORDER — POTASSIUM CHLORIDE CRYS ER 20 MEQ PO TBCR: 40.0000 meq | EXTENDED_RELEASE_TABLET | Freq: Once | ORAL | Status: DC

## 2016-12-23 MED ORDER — POTASSIUM CHLORIDE CRYS ER 20 MEQ PO TBCR: 20.0000 meq | EXTENDED_RELEASE_TABLET | Freq: Every day | ORAL | 0 refills | Status: DC

## 2016-12-23 NOTE — Progress Notes (Signed)
[place] at [place] NAME: [name]    MR#:  [id number]  DATE OF BIRTH:  02-Jul-1955  SUBJECTIVE:  CHIEF COMPLAINT:   Chief Complaint  Patient presents with  . Fall   Awake and alert. Diarrhea due to lactulose  REVIEW OF SYSTEMS:    Review of Systems  Constitutional: Positive for malaise/fatigue. Negative for chills and fever.  HENT: Negative for sore throat.   Eyes: Negative for blurred vision, double vision and pain.  Respiratory: Negative for cough, hemoptysis, shortness of breath and wheezing.   Cardiovascular: Negative for chest pain, palpitations, orthopnea and leg swelling.  Gastrointestinal: Negative for abdominal pain, constipation, diarrhea, heartburn, nausea and vomiting.  Genitourinary: Negative for dysuria and hematuria.  Musculoskeletal: Positive for joint pain. Negative for back pain.  Skin: Negative for rash.  Neurological: Negative for sensory change, speech change, focal weakness and headaches.  Endo/Heme/Allergies: Does not bruise/bleed easily.  Psychiatric/Behavioral: Negative for depression. The patient is nervous/anxious.     DRUG ALLERGIES:  No Known Allergies  VITALS:  Blood pressure (!) 117/44, pulse 79, temperature 98.4 F (36.9 C), temperature source Oral, resp. rate 18, height 5\' 10"  (1.778 m), weight 64.2 kg (141 lb 7 oz), SpO2 93 %.  PHYSICAL EXAMINATION:   Physical Exam  GENERAL:  61 y.o.-year-old patient lying in the bed with no acute distress.  EYES: Pupils equal, round, reactive to light and accommodation. No scleral icterus. Extraocular muscles intact.  HEENT: Head atraumatic, normocephalic. Oropharynx and nasopharynx clear.  NECK:  Supple, no jugular venous distention. No thyroid enlargement, no tenderness.  LUNGS: Normal breath sounds bilaterally, no wheezing, rales, rhonchi. No use of accessory muscles of respiration.  CARDIOVASCULAR: S1, S2 normal. No murmurs, rubs, or gallops.   ABDOMEN: Soft, nontender, nondistended. Bowel sounds present. No organomegaly or mass.  EXTREMITIES: No cyanosis, clubbing or edema b/l.    NEUROLOGIC: Cranial nerves II through XII are intact. No focal Motor or sensory deficits b/l.   PSYCHIATRIC: The patient is alert and oriented x 3.  SKIN: left fifth toe bruise  LABORATORY PANEL:   CBC  Recent Labs Lab 12/22/16 0543  WBC 4.4  HGB 12.4*  HCT 36.2*  PLT 91*   ------------------------------------------------------------------------------------------------------------------ Chemistries   Recent Labs Lab 12/22/16 0543  NA 139  K 3.1*  CL 107  CO2 24  GLUCOSE 100*  BUN 11  CREATININE 0.81  CALCIUM 8.0*  AST 77*  ALT 53  ALKPHOS 93  BILITOT 2.1*   ------------------------------------------------------------------------------------------------------------------  Cardiac Enzymes  Recent Labs Lab 12/21/16 1129  TROPONINI <0.03   ------------------------------------------------------------------------------------------------------------------  RADIOLOGY:  Dg Chest 2 View  Result Date: 12/21/2016 CLINICAL DATA:  Fall. EXAM: CHEST  2 VIEW COMPARISON:  09/14/2016 FINDINGS: The heart size and mediastinal contours are within normal limits. Chronic lung disease with prominent bilateral interstitial prominence and scarring. Resolution of right upper lobe airspace disease since the prior study. There is no evidence of pulmonary edema, consolidation, pneumothorax, nodule or pleural fluid. Increased density associated with the distal aspects of the left fifth, sixth and seventh ribs near the costochondral junctions may be secondary to interval injury. These do not have the appearance of acute fractures. IMPRESSION: Stable chronic lung disease. Interval increased density within the distal aspects of the left fifth through seventh ribs near costochondral junctions. This may be secondary to injury but do not appear to represent  acute fractures. Electronically Signed   By: Aletta Edouard M.D.   On: 12/21/2016  14:18   Dg Toe 5th Left  Result Date: 12/22/2016 CLINICAL DATA:  Blow to the left little toe on a bed 12/21/2016. Initial encounter. EXAM: DG TOE 5TH LEFT COMPARISON:  None. FINDINGS: The patient has a nondisplaced fracture of the proximal metaphysis of the proximal phalanx little toe with associated soft tissue swelling. No other acute abnormality is identified. IMPRESSION: Nondisplaced fracture proximal metaphysis proximal phalanx left little toe. Electronically Signed   By: Inge Rise M.D.   On: 12/22/2016 11:19     ASSESSMENT AND PLAN:   * Acute toxic encephalopathy due to cocaine abuse Some component of hepatic encephalopathy also On lactulose and rafiximin. Back to baseline counseled  * Left fifth toe pain Xray ordered and shows non displaced phalanx fracture Discussed with Dr. Vickki Muff and advised post-op shoe and FWB.  * urinary retention In and Out once. Foley if > 500 ml on bladder scan again. Flomax.  * HTN  * COPD  D/c in AM if no further urinary retention  All the records are reviewed and case discussed with Care Management/Social Workerr. Management plans discussed with the patient, family and they are in agreement.  CODE STATUS: FULL CODE  DVT Prophylaxis: SCDs  TOTAL TIME TAKING CARE OF THIS PATIENT: 35 minutes.   POSSIBLE D/C IN 1-2 DAYS, DEPENDING ON CLINICAL CONDITION.  Hillary Bow R M.D on 12/23/2016 at 10:35 AM  Between 7am to 6pm - Pager - [phone]  After 6pm go to [email or web address] - password [email or web address] [facility]  Office  [phone]  CC: Primary care physician; Theotis Burrow, MD  Note: This dictation was prepared with Dragon dictation along with smaller phrase technology. Any transcriptional errors that result from this process are unintentional.

## 2016-12-23 NOTE — Care Management Note (Signed)
Case Management Note  Patient Details  Name: [name] MRN: [id number] Date of Birth: 11-Dec-1955  Subjective/Objective:   Admitted to [facility] with the diagnosis of hepatic encephalopathy. Lives with his mother, [name] [phone]). Last seen Dr. William Hamburger 3 weeks ago. Prescriptions ar filled at [facility],   No Home Health. No skilled facility. Takes care of all basic activities of daily living himself, drives. Last fall was 4 months ago. Fair appetite. Sister will transport.     Discharge to home today per Dr, Darvin Neighbours.            Action/Plan: Physical therapy evaluation completed. Recommending outpatient therapy, Declining these services at this time   Expected Discharge Date:  12/23/16               Expected Discharge Plan:     In-House Referral:     Discharge planning Services     Post Acute Care Choice:    Choice offered to:     DME Arranged:    DME Agency:     HH Arranged:    [place] Agency:     Status of Service:     If discussed at [employer], dates discussed:    Additional Comments:  Shelbie Ammons, [place] Management [phone] 12/23/2016, 12:57 PM

## 2016-12-24 NOTE — Discharge Summary (Signed)
[facility] [place] NAME: [name]    MR#:  [id number]  DATE OF BIRTH:  07-28-1955  DATE OF ADMISSION:  12/21/2016 ADMITTING PHYSICIAN: Idelle Crouch, MD  DATE OF DISCHARGE: 12/23/2016  1:28 PM  PRIMARY CARE PHYSICIAN: Theotis Burrow, MD   ADMISSION DIAGNOSIS:  Hepatic encephalopathy ([place]) [K72.90] SOB (shortness of breath) [R06.02] Unwitnessed fall [R29.6]  DISCHARGE DIAGNOSIS:  Principal Problem:   Hepatic encephalopathy ([name]) Active Problems:   HTN (hypertension)   Polysubstance abuse ([name])   Syncope   Cocaine abuse ([place])   Acute hepatic encephalopathy   SECONDARY DIAGNOSIS:   Past Medical History:  Diagnosis Date  . Anxiety unk  . Arthritis   . Asthma   . Chronic back pain unk  . COPD (chronic obstructive pulmonary disease) ([place])    now on 2L home o2  . Depression   . Diabetes mellitus without complication ([place])   . Dyspnea   . GERD (gastroesophageal reflux disease)   . Hep C w/o coma, chronic ([place])   . Hepatitis C, chronic ([place])   . Hepatitis C, chronic ([place])   . Hypertension      ADMITTING HISTORY  HPI: [name] is a 61 y.o. male has a past medical history significant for COPD, DM, IV drug abuse, and chronic pain now with syncope after "snorting cocaine". In ER, pt is confused. LFT's and ammonia elevated. He is now admitted. No family present. Pt is unable to provide hx. Lives with his elderly mother  HOSPITAL COURSE:   * Acute toxic encephalopathy due to cocaine abuse * Acute hepatic encephalopathy * Left fifth toe nondisplaced phalanx fracture * Urinary retention due to BPH. Resolved * Hypertension. * COPD * Noncompliant  * Polysubstance use * Syncope  Patient was admitted to the medical floor and started on lactulose. Slowly he improved. His confusion and syncope were due to cocaine.  Seen y GI. Advised to continue lactulose and rifaximin. Patient was seen by psychiatry and  advice changes to his medications. But he did not want his medications changed.  Patient had urinary retention during the hospital stay. Flomax was started. He need it in and out catheterization once. Patient wanted 3 times on the day of discharge without any problems.  Patient is stable for discharge. Counseled on multiple occasions to be compliant with medications and not use any further cocaine.   CONSULTS OBTAINED:  Treatment Team:  Leotis Pain, MD Ramond Dial, MD  DRUG ALLERGIES:  No Known Allergies  DISCHARGE MEDICATIONS:   Discharge Medication List as of 12/23/2016 10:39 AM    CONTINUE these medications which have NOT CHANGED   Details  ALPRAZolam (XANAX) 1 MG tablet Take 1 mg by mouth 3 (three) times daily as needed for anxiety., Historical Med    budesonide-formoterol (SYMBICORT) 160-4.5 MCG/ACT inhaler Inhale 2 puffs into the lungs 2 (two) times daily., Starting Thu 09/05/2016, Print    lactulose (CHRONULAC) 10 GM/15ML solution Take 45 mLs (30 g total) by mouth 2 (two) times daily., Starting Fri 08/12/2014, Print    methadone (DOLOPHINE) 10 MG tablet Take 20 mg by mouth every 8 (eight) hours., Historical Med    rifaximin (XIFAXAN) 550 MG TABS tablet Take 1 tablet (550 mg total) by mouth 2 (two) times daily., Starting Fri 08/12/2014, Print    tiotropium (SPIRIVA) 18 MCG inhalation capsule Place 18 mcg into inhaler and inhale daily., Historical Med        Today  VITAL SIGNS:  Blood pressure (!) 141/59, pulse 79, temperature 98.1 F (36.7 C), temperature source Oral, resp. rate 18, height 5\' 10"  (1.778 m), weight 64.2 kg (141 lb 7 oz), SpO2 93 %.  I/O:  No intake or output data in the 24 hours ending 12/24/16 1323  PHYSICAL EXAMINATION:  Physical Exam  GENERAL:  61 y.o.-year-old patient lying in the bed with no acute distress.  LUNGS: Normal breath sounds bilaterally, no wheezing, rales,rhonchi or crepitation. No use of accessory muscles of respiration.   CARDIOVASCULAR: S1, S2 normal. No murmurs, rubs, or gallops.  ABDOMEN: Soft, non-tender, non-distended. Bowel sounds present. No organomegaly or mass.  NEUROLOGIC: Moves all 4 extremities. PSYCHIATRIC: The patient is alert and oriented x 3.  SKIN: No obvious rash, lesion, or ulcer.   DATA REVIEW:   CBC  Recent Labs Lab 12/22/16 0543  WBC 4.4  HGB 12.4*  HCT 36.2*  PLT 91*    Chemistries   Recent Labs Lab 12/22/16 0543  NA 139  K 3.1*  CL 107  CO2 24  GLUCOSE 100*  BUN 11  CREATININE 0.81  CALCIUM 8.0*  AST 77*  ALT 53  ALKPHOS 93  BILITOT 2.1*    Cardiac Enzymes  Recent Labs Lab 12/21/16 1129  TROPONINI <0.03    Microbiology Results  Results for orders placed or performed during the hospital encounter of 09/09/16  Culture, blood (routine x 2)     Status: None   Collection Time: 09/09/16  8:33 PM  Result Value Ref Range Status   Specimen Description BLOOD BLOOD RIGHT ARM  Final   Special Requests   Final    BOTTLES DRAWN AEROBIC AND ANAEROBIC Blood Culture adequate volume   Culture NO GROWTH 5 DAYS  Final   Report Status 09/14/2016 FINAL  Final  Culture, blood (routine x 2)     Status: None   Collection Time: 09/09/16  8:33 PM  Result Value Ref Range Status   Specimen Description BLOOD BLOOD RIGHT FOREARM  Final   Special Requests   Final    BOTTLES DRAWN AEROBIC AND ANAEROBIC Blood Culture adequate volume   Culture NO GROWTH 5 DAYS  Final   Report Status 09/14/2016 FINAL  Final  MRSA PCR Screening     Status: None   Collection Time: 09/12/16  2:30 PM  Result Value Ref Range Status   MRSA by PCR NEGATIVE NEGATIVE Final    Comment:        The GeneXpert MRSA Assay (FDA approved for NASAL specimens only), is one component of a comprehensive MRSA colonization surveillance program. It is not intended to diagnose MRSA infection nor to guide or monitor treatment for MRSA infections.     RADIOLOGY:  No results found.  Follow up with PCP in  1 week.  Management plans discussed with the patient, family and they are in agreement.  CODE STATUS:  Code Status History    Date Active Date Inactive Code Status Order ID Comments User Context   12/21/2016  9:59 PM 12/23/2016  5:15 PM Full Code [id number]  Idelle Crouch, MD Inpatient   09/09/2016 10:50 PM 09/16/2016  5:27 PM Full Code [id number]  Gladstone Lighter, MD Inpatient   09/09/2016  7:34 PM 09/09/2016 10:50 PM Full Code [id number]  Nena Polio, MD ED   09/03/2016  9:52 PM 09/05/2016  6:44 PM Full Code [id number]  Henreitta Leber, MD Inpatient   11/06/2014  5:51 AM 11/07/2014  6:18 PM Full Code [id number]  Juluis Mire, MD Inpatient   08/10/2014  8:23 PM 08/12/2014  4:23 PM Full Code [id number]  Theodoro Grist, MD Inpatient      TOTAL TIME TAKING CARE OF THIS PATIENT ON DAY OF DISCHARGE: more than 30 minutes.   Hillary Bow R M.D on 12/24/2016 at 1:23 PM  Between 7am to 6pm - Pager - [phone]  After 6pm go to [email or web address] - password [email or web address] [facility]  Office  [phone]  CC: Primary care physician; Theotis Burrow, MD  Note: This dictation was prepared with Dragon dictation along with smaller phrase technology. Any transcriptional errors that result from this process are unintentional.

## 2017-03-06 ENCOUNTER — Encounter: Payer: Self-pay | Admitting: *Deleted

## 2017-03-07 ENCOUNTER — Ambulatory Visit: Admission: RE | Admit: 2017-03-07 | Payer: Medicaid Other | Source: Ambulatory Visit | Admitting: Gastroenterology

## 2017-03-07 ENCOUNTER — Encounter: Admission: RE | Payer: Self-pay | Source: Ambulatory Visit

## 2017-03-07 HISTORY — DX: Encephalopathy, unspecified: G93.40

## 2017-03-07 HISTORY — DX: Acute respiratory failure, unspecified whether with hypoxia or hypercapnia: J96.00

## 2017-03-07 HISTORY — DX: Encounter for screening for malignant neoplasm of colon: Z12.11

## 2017-03-07 HISTORY — DX: Unspecified cirrhosis of liver: K74.60

## 2017-03-07 SURGERY — ESOPHAGOGASTRODUODENOSCOPY (EGD) WITH PROPOFOL
Anesthesia: General

## 2017-03-19 ENCOUNTER — Encounter: Payer: Self-pay | Admitting: *Deleted

## 2017-03-20 ENCOUNTER — Ambulatory Visit: Payer: Medicaid Other | Admitting: Anesthesiology

## 2017-03-20 ENCOUNTER — Ambulatory Visit
Admission: RE | Admit: 2017-03-20 | Discharge: 2017-03-20 | Disposition: A | Discharge status: Home / Self Care | Payer: Medicaid Other | Source: Ambulatory Visit | Attending: Gastroenterology | Admitting: Gastroenterology

## 2017-03-20 ENCOUNTER — Encounter: Payer: Self-pay | Admitting: Emergency Medicine

## 2017-03-20 ENCOUNTER — Encounter
Admission: RE | Disposition: A | Discharge status: Home / Self Care | Payer: Self-pay | Source: Ambulatory Visit | Attending: Gastroenterology

## 2017-03-20 DIAGNOSIS — B1920 Unspecified viral hepatitis C without hepatic coma: Secondary | ICD-10-CM | POA: Diagnosis not present

## 2017-03-20 DIAGNOSIS — K3189 Other diseases of stomach and duodenum: Secondary | ICD-10-CM | POA: Diagnosis not present

## 2017-03-20 DIAGNOSIS — I8500 Esophageal varices without bleeding: Secondary | ICD-10-CM | POA: Diagnosis not present

## 2017-03-20 DIAGNOSIS — K766 Portal hypertension: Secondary | ICD-10-CM | POA: Diagnosis not present

## 2017-03-20 DIAGNOSIS — Z9981 Dependence on supplemental oxygen: Secondary | ICD-10-CM | POA: Diagnosis not present

## 2017-03-20 DIAGNOSIS — I10 Essential (primary) hypertension: Secondary | ICD-10-CM | POA: Diagnosis not present

## 2017-03-20 DIAGNOSIS — F419 Anxiety disorder, unspecified: Secondary | ICD-10-CM | POA: Diagnosis not present

## 2017-03-20 DIAGNOSIS — F329 Major depressive disorder, single episode, unspecified: Secondary | ICD-10-CM | POA: Insufficient documentation

## 2017-03-20 DIAGNOSIS — K209 Esophagitis, unspecified: Secondary | ICD-10-CM | POA: Insufficient documentation

## 2017-03-20 DIAGNOSIS — Z79899 Other long term (current) drug therapy: Secondary | ICD-10-CM | POA: Insufficient documentation

## 2017-03-20 DIAGNOSIS — J449 Chronic obstructive pulmonary disease, unspecified: Secondary | ICD-10-CM | POA: Insufficient documentation

## 2017-03-20 DIAGNOSIS — K2970 Gastritis, unspecified, without bleeding: Secondary | ICD-10-CM | POA: Diagnosis not present

## 2017-03-20 DIAGNOSIS — E119 Type 2 diabetes mellitus without complications: Secondary | ICD-10-CM | POA: Insufficient documentation

## 2017-03-20 DIAGNOSIS — K7460 Unspecified cirrhosis of liver: Secondary | ICD-10-CM | POA: Diagnosis present

## 2017-03-20 DIAGNOSIS — G8929 Other chronic pain: Secondary | ICD-10-CM | POA: Diagnosis not present

## 2017-03-20 DIAGNOSIS — N400 Enlarged prostate without lower urinary tract symptoms: Secondary | ICD-10-CM | POA: Diagnosis not present

## 2017-03-20 HISTORY — PX: ESOPHAGOGASTRODUODENOSCOPY (EGD) WITH PROPOFOL: SHX5813

## 2017-03-20 LAB — URINE DRUG SCREEN, QUALITATIVE (ARMC ONLY)
AMPHETAMINES, UR SCREEN: NOT DETECTED
Barbiturates, Ur Screen: NOT DETECTED
Benzodiazepine, Ur Scrn: POSITIVE — AB
Cannabinoid 50 Ng, Ur ~~LOC~~: NOT DETECTED
Cocaine Metabolite,Ur ~~LOC~~: NOT DETECTED
MDMA (ECSTASY) UR SCREEN: NOT DETECTED
Methadone Scn, Ur: POSITIVE — AB
OPIATE, UR SCREEN: NOT DETECTED
PHENCYCLIDINE (PCP) UR S: NOT DETECTED
Tricyclic, Ur Screen: NOT DETECTED

## 2017-03-20 LAB — CBC WITH DIFFERENTIAL/PLATELET
BASOS PCT: 1 %
Basophils Absolute: 0.1 10*3/uL (ref 0–0.1)
EOS PCT: 3 %
Eosinophils Absolute: 0.2 10*3/uL (ref 0–0.7)
HEMATOCRIT: 42.4 % (ref 40.0–52.0)
Hemoglobin: 14.4 g/dL (ref 13.0–18.0)
LYMPHS PCT: 26 %
Lymphs Abs: 1.4 10*3/uL (ref 1.0–3.6)
MCH: 34.1 pg — ABNORMAL HIGH (ref 26.0–34.0)
MCHC: 33.8 g/dL (ref 32.0–36.0)
MCV: 100.9 fL — AB (ref 80.0–100.0)
MONO ABS: 0.6 10*3/uL (ref 0.2–1.0)
MONOS PCT: 11 %
NEUTROS ABS: 3 10*3/uL (ref 1.4–6.5)
Neutrophils Relative %: 59 %
Platelets: 113 10*3/uL — ABNORMAL LOW (ref 150–440)
RBC: 4.2 MIL/uL — ABNORMAL LOW (ref 4.40–5.90)
RDW: 13.1 % (ref 11.5–14.5)
WBC: 5.2 10*3/uL (ref 3.8–10.6)

## 2017-03-20 LAB — GLUCOSE, CAPILLARY: Glucose-Capillary: 126 mg/dL — ABNORMAL HIGH (ref 65–99)

## 2017-03-20 LAB — PROTIME-INR
INR: 1.15
Prothrombin Time: 14.6 seconds (ref 11.4–15.2)

## 2017-03-20 SURGERY — ESOPHAGOGASTRODUODENOSCOPY (EGD) WITH PROPOFOL
Anesthesia: General

## 2017-03-20 MED ORDER — MIDAZOLAM HCL 2 MG/2ML IJ SOLN
INTRAMUSCULAR | Status: DC | PRN
  Administered 2017-03-20: 2 mg via INTRAVENOUS

## 2017-03-20 MED ORDER — LIDOCAINE HCL (PF) 2 % IJ SOLN
INTRAMUSCULAR | Status: AC
  Filled 2017-03-20: qty 10

## 2017-03-20 MED ORDER — PROPOFOL 500 MG/50ML IV EMUL
INTRAVENOUS | Status: DC | PRN
  Administered 2017-03-20: 160 ug/kg/min via INTRAVENOUS

## 2017-03-20 MED ORDER — MIDAZOLAM HCL 2 MG/2ML IJ SOLN
INTRAMUSCULAR | Status: AC
  Filled 2017-03-20: qty 2

## 2017-03-20 MED ORDER — PROPOFOL 10 MG/ML IV BOLUS
INTRAVENOUS | Status: DC | PRN
  Administered 2017-03-20: 60 mg via INTRAVENOUS

## 2017-03-20 MED ORDER — LIDOCAINE HCL (CARDIAC) 20 MG/ML IV SOLN
INTRAVENOUS | Status: DC | PRN
  Administered 2017-03-20: 70 mg via INTRAVENOUS

## 2017-03-20 MED ORDER — SODIUM CHLORIDE 0.9 % IV SOLN
INTRAVENOUS | Status: DC
  Administered 2017-03-20: 1000 mL via INTRAVENOUS
  Administered 2017-03-20: 10:00:00 via INTRAVENOUS

## 2017-03-20 MED ORDER — SODIUM CHLORIDE 0.9 % IV SOLN: INTRAVENOUS | Status: DC

## 2017-03-20 NOTE — Transfer of Care (Signed)
Immediate Anesthesia Transfer of Care Note  Patient: Breken Nazari  Procedure(s) Performed: ESOPHAGOGASTRODUODENOSCOPY (EGD) WITH PROPOFOL (N/A )  Patient Location: Endoscopy Unit  Anesthesia Type:General  Level of Consciousness: drowsy and patient cooperative  Airway & Oxygen Therapy: Patient Spontanous Breathing and Patient connected to nasal cannula oxygen  Post-op Assessment: Report given to RN and Post -op Vital signs reviewed and stable  Post vital signs: Reviewed and stable  Last Vitals:  Vitals:   03/20/17 0833 03/20/17 1048  BP: (!) 147/85   Pulse: (!) 115   Resp: 18   Temp: 36.7 C (!) (P) 36.2 C  SpO2: 94% (P) 95%    Last Pain:  Vitals:   03/20/17 0833  TempSrc: Oral         Complications: No apparent anesthesia complications
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
## 2017-03-20 NOTE — H&P (Signed)
Outpatient short stay form Pre-procedure 03/20/2017 10:04 AM Tommy Sails MD  Primary Physician: Dr. Aletta Edouard  Reason for visit:  EGD  History of present illness:  Patient is a 62 year old male presenting today as above. 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 He is presenting today for EGD for variceal screening. He did have a CBC and pro time today. A urine drug screen today was negative for drugs of abuse. His INR today was 1.15 platelet count was 113 and his neutrophil count was 3.0. He denies use of any blood thinning agents or aspirin products.    Current Facility-Administered Medications:  .  0.9 %  sodium chloride infusion, , Intravenous, Continuous, Tommy Sails, MD, Last Rate: 20 mL/hr at 03/20/17 1002, 1,000 mL at 03/20/17 1002 .  0.9 %  sodium chloride infusion, , Intravenous, Continuous, Tommy Sails, MD  Medications Prior to Admission  Medication Sig Dispense Refill Last Dose  . alfuzosin (UROXATRAL) 10 MG 24 hr tablet Take 10 mg by mouth daily with breakfast.   Past Week at Unknown time  . ALPRAZolam (XANAX) 1 MG tablet Take 1 mg by mouth 3 (three) times daily as needed for anxiety.   03/19/2017 at Unknown time  . budesonide-formoterol (SYMBICORT) 160-4.5 MCG/ACT inhaler Inhale 2 puffs into the lungs 2 (two) times daily. 1 Inhaler 0 03/20/2017 at Unknown time  . ipratropium-albuterol (DUONEB) 0.5-2.5 (3) MG/3ML SOLN Take 3 mLs by nebulization every 6 (six) hours as needed.   Past Month at Unknown time  . lactulose (CHRONULAC) 10 GM/15ML solution Take 45 mLs (30 g total) by mouth 2 (two) times daily. (Patient taking differently: Take 10 g by mouth 2 (two) times daily. ) 240 mL 2 03/19/2017 at Unknown time  . methadone (DOLOPHINE) 10 MG tablet Take 20 mg by mouth every 12 (twelve) hours.    03/20/2017 at Unknown time  . tiotropium (SPIRIVA) 18 MCG inhalation capsule Place 18 mcg into inhaler and  inhale daily.   03/20/2017 at Unknown time  . potassium chloride SA (K-DUR,KLOR-CON) 20 MEQ tablet Take 1 tablet (20 mEq total) by mouth daily. (Patient not taking: Reported on 03/19/2017) 30 tablet 0 Not Taking at Unknown time  . rifaximin (XIFAXAN) 550 MG TABS tablet Take 1 tablet (550 mg total) by mouth 2 (two) times daily. (Patient not taking: Reported on 03/19/2017) 60 tablet 1 Not Taking at Unknown time     No Known Allergies   Past Medical History:  Diagnosis Date  . Acute respiratory failure ([place])   . Anxiety unk  . Arthritis   . Asthma   . BPH (benign prostatic hyperplasia)   . Chronic back pain unk  . Cirrhosis ([place])   . COPD (chronic obstructive pulmonary disease) ([place])    now on 2L home o2  . Depression   . Diabetes mellitus without complication ([place])   . Dyspnea    DOE  . Encephalopathy   . Encephalopathy   . GERD (gastroesophageal reflux disease)   . Hep C w/o coma, chronic ([place])   . Hepatitis C, chronic ([place])   . Hepatitis C, chronic ([name])    TO START MEDICATION AFTER ENDOSCOPY  . History of hiatal hernia   . Hypertension    NO MEDS NOW  . Screening for malignant neoplasm of colon     Review of systems:      Physical Exam    Heart and lungs: Regular  rate and rhythm without rub or gallop, lungs are bilaterally clear.    HEENT: Normocephalic atraumatic eyes are anicteric, edentulous    Other:     Pertinant exam for procedure: Soft mild discomfort in the right upper quadrant. This is chronic. There is a palpable liver edge with some mild hepatomegaly. There are no masses or rebound. Bowel sounds are positive normoactive    Planned proceedures: EGD and indicated procedures. I have discussed the risks benefits and complications of procedures to include not limited to bleeding, infection, perforation and the risk of sedation and the patient wishes to proceed.    Tommy Sails, MD Gastroenterology 03/20/2017  10:04 AM

## 2017-03-20 NOTE — Anesthesia Post-op Follow-up Note (Signed)
Anesthesia QCDR form completed.        

## 2017-03-20 NOTE — Op Note (Signed)
[facility] Gastroenterology Patient Name: [name] Procedure Date: 03/20/2017 9:11 AM MRN: [id number] Account #: [id number] Date of Birth: Mar 05, 1956 Admit Type: Outpatient Age: 62 Room: [facility] ENDO ROOM 1 Gender: Male Note Status: Finalized Procedure:            Upper GI endoscopy Indications:          Cirrhosis rule out esophageal varices Providers:            [name], MD Referring MD:         No Local Md, MD (Referring MD) Medicines:            Monitored Anesthesia Care Complications:        No immediate complications. Procedure:            Pre-Anesthesia Assessment:                       - ASA Grade Assessment: III - A patient with severe                        systemic disease.                       After obtaining informed consent, the endoscope was                        passed under direct vision. Throughout the procedure,                        the patient's blood pressure, pulse, and oxygen                        saturations were monitored continuously. The Endoscope                        was introduced through the mouth, and advanced to the                        third part of duodenum. The upper GI endoscopy was                        accomplished without difficulty. The patient tolerated                        the procedure well. Findings:      LA Grade B (one or more mucosal breaks greater than 5 mm, not extending       between the tops of two mucosal folds) esophagitis with no bleeding was       found.      Grade I, small (< 5 mm) varices were found in the lower third of the       esophagus. They were 1 mm in largest diameter.      Mild portal hypertensive gastropathy was found in the gastric body.      Patchy minimal inflammation characterized by congestion (edema) and       erythema was found at the incisura and in the gastric antrum. Biopsies       were taken with a cold forceps for histology. Biopsies were taken with a   cold forceps for Helicobacter pylori testing.      The cardia and gastric fundus were normal on retroflexion.  The examined duodenum was normal. Impression:           - LA Grade B esophagitis.                       - Grade I and small (< 5 mm) esophageal varices.                       - Portal hypertensive gastropathy.                       - Gastritis. Biopsied.                       - Normal examined duodenum. Recommendation:       - Use Prilosec (omeprazole) 20 mg PO daily daily.                       - Return to GI clinic in 6 weeks. Procedure Code(s):    --- Professional ---                       [phone], Esophagogastroduodenoscopy, flexible, transoral;                        with biopsy, single or multiple Diagnosis Code(s):    --- Professional ---                       K20.9, Esophagitis, unspecified                       K74.60, Unspecified cirrhosis of liver                       I85.10, Secondary esophageal varices without bleeding                       K76.6, Portal hypertension                       K31.89, Other diseases of stomach and duodenum                       K29.70, Gastritis, unspecified, without bleeding CPT copyright 2016 American Medical Association. All rights reserved. The codes documented in this report are preliminary and upon coder review may  be revised to meet current compliance requirements. [name], MD 03/20/2017 10:46:53 AM This report has been signed electronically. Number of Addenda: 0 Note Initiated On: 03/20/2017 9:11 AM      [facility]

## 2017-03-21 LAB — SURGICAL PATHOLOGY

## 2017-03-23 NOTE — Anesthesia Postprocedure Evaluation (Signed)
Anesthesia Post Note  Patient: Nolan Tuazon  Procedure(s) Performed: ESOPHAGOGASTRODUODENOSCOPY (EGD) WITH PROPOFOL (N/A )  Patient location during evaluation: PACU Anesthesia Type: General Level of consciousness: awake and alert Pain management: pain level controlled Vital Signs Assessment: post-procedure vital signs reviewed and stable Respiratory status: spontaneous breathing, nonlabored ventilation, respiratory function stable and patient connected to nasal cannula oxygen Cardiovascular status: blood pressure returned to baseline and stable Postop Assessment: no apparent nausea or vomiting Anesthetic complications: no     Last Vitals:  Vitals:   03/20/17 1108 03/20/17 1118  BP: 121/89   Pulse: 95 91  Resp: (!) 22 14  Temp:    SpO2: 95% 95%    Last Pain:  Vitals:   03/21/17 0752  TempSrc:   PainSc: 0-No pain                 Molli Barrows

## 2017-03-24 ENCOUNTER — Encounter: Payer: Self-pay | Admitting: Gastroenterology

## 2017-03-28 ENCOUNTER — Other Ambulatory Visit: Payer: Self-pay | Admitting: Gastroenterology

## 2017-03-28 DIAGNOSIS — B1920 Unspecified viral hepatitis C without hepatic coma: Secondary | ICD-10-CM

## 2017-04-01 ENCOUNTER — Ambulatory Visit
Admission: RE | Admit: 2017-04-01 | Discharge: 2017-04-01 | Disposition: A | Discharge status: Home / Self Care | Payer: Medicaid Other | Source: Ambulatory Visit | Attending: Gastroenterology | Admitting: Gastroenterology

## 2017-04-01 DIAGNOSIS — K839 Disease of biliary tract, unspecified: Secondary | ICD-10-CM | POA: Diagnosis not present

## 2017-04-01 DIAGNOSIS — K7460 Unspecified cirrhosis of liver: Secondary | ICD-10-CM | POA: Insufficient documentation

## 2017-04-01 DIAGNOSIS — B1920 Unspecified viral hepatitis C without hepatic coma: Secondary | ICD-10-CM

## 2017-04-03 ENCOUNTER — Other Ambulatory Visit: Payer: Self-pay

## 2017-04-03 ENCOUNTER — Encounter
Admission: RE | Disposition: A | Discharge status: Home / Self Care | Payer: Self-pay | Source: Ambulatory Visit | Attending: Ophthalmology

## 2017-04-03 ENCOUNTER — Ambulatory Visit: Payer: Medicaid Other | Admitting: Anesthesiology

## 2017-04-03 ENCOUNTER — Ambulatory Visit
Admission: RE | Admit: 2017-04-03 | Discharge: 2017-04-03 | Disposition: A | Discharge status: Home / Self Care | Payer: Medicaid Other | Source: Ambulatory Visit | Attending: Ophthalmology | Admitting: Ophthalmology

## 2017-04-03 ENCOUNTER — Encounter: Payer: Self-pay | Admitting: *Deleted

## 2017-04-03 DIAGNOSIS — E119 Type 2 diabetes mellitus without complications: Secondary | ICD-10-CM | POA: Diagnosis not present

## 2017-04-03 DIAGNOSIS — B182 Chronic viral hepatitis C: Secondary | ICD-10-CM | POA: Diagnosis not present

## 2017-04-03 DIAGNOSIS — F419 Anxiety disorder, unspecified: Secondary | ICD-10-CM | POA: Insufficient documentation

## 2017-04-03 DIAGNOSIS — Z79899 Other long term (current) drug therapy: Secondary | ICD-10-CM | POA: Insufficient documentation

## 2017-04-03 DIAGNOSIS — I10 Essential (primary) hypertension: Secondary | ICD-10-CM | POA: Diagnosis not present

## 2017-04-03 DIAGNOSIS — K219 Gastro-esophageal reflux disease without esophagitis: Secondary | ICD-10-CM | POA: Diagnosis not present

## 2017-04-03 DIAGNOSIS — Z87891 Personal history of nicotine dependence: Secondary | ICD-10-CM | POA: Diagnosis not present

## 2017-04-03 DIAGNOSIS — H2512 Age-related nuclear cataract, left eye: Secondary | ICD-10-CM | POA: Diagnosis present

## 2017-04-03 DIAGNOSIS — K7460 Unspecified cirrhosis of liver: Secondary | ICD-10-CM | POA: Insufficient documentation

## 2017-04-03 DIAGNOSIS — M1990 Unspecified osteoarthritis, unspecified site: Secondary | ICD-10-CM | POA: Diagnosis not present

## 2017-04-03 DIAGNOSIS — F329 Major depressive disorder, single episode, unspecified: Secondary | ICD-10-CM | POA: Diagnosis not present

## 2017-04-03 DIAGNOSIS — J449 Chronic obstructive pulmonary disease, unspecified: Secondary | ICD-10-CM | POA: Insufficient documentation

## 2017-04-03 DIAGNOSIS — N400 Enlarged prostate without lower urinary tract symptoms: Secondary | ICD-10-CM | POA: Insufficient documentation

## 2017-04-03 HISTORY — DX: Benign prostatic hyperplasia without lower urinary tract symptoms: N40.0

## 2017-04-03 HISTORY — DX: Personal history of other diseases of the digestive system: Z87.19

## 2017-04-03 HISTORY — PX: CATARACT EXTRACTION W/PHACO: SHX586

## 2017-04-03 LAB — URINE DRUG SCREEN, QUALITATIVE (ARMC ONLY)
AMPHETAMINES, UR SCREEN: NOT DETECTED
BENZODIAZEPINE, UR SCRN: POSITIVE — AB
Barbiturates, Ur Screen: NOT DETECTED
CANNABINOID 50 NG, UR ~~LOC~~: NOT DETECTED
Cocaine Metabolite,Ur ~~LOC~~: NOT DETECTED
MDMA (ECSTASY) UR SCREEN: NOT DETECTED
Methadone Scn, Ur: POSITIVE — AB
OPIATE, UR SCREEN: NOT DETECTED
PHENCYCLIDINE (PCP) UR S: NOT DETECTED
Tricyclic, Ur Screen: NOT DETECTED

## 2017-04-03 LAB — GLUCOSE, CAPILLARY: GLUCOSE-CAPILLARY: 107 mg/dL — AB (ref 65–99)

## 2017-04-03 SURGERY — PHACOEMULSIFICATION, CATARACT, WITH IOL INSERTION
Anesthesia: Monitor Anesthesia Care | Laterality: Left

## 2017-04-03 MED ORDER — FENTANYL CITRATE (PF) 100 MCG/2ML IJ SOLN
INTRAMUSCULAR | Status: AC
  Filled 2017-04-03: qty 2

## 2017-04-03 MED ORDER — MIDAZOLAM HCL 2 MG/2ML IJ SOLN
INTRAMUSCULAR | Status: AC
  Filled 2017-04-03: qty 2

## 2017-04-03 MED ORDER — MOXIFLOXACIN HCL 0.5 % OP SOLN
OPHTHALMIC | Status: AC
  Filled 2017-04-03: qty 3

## 2017-04-03 MED ORDER — LIDOCAINE HCL (PF) 4 % IJ SOLN
INTRAMUSCULAR | Status: AC
  Filled 2017-04-03: qty 5

## 2017-04-03 MED ORDER — POVIDONE-IODINE 5 % OP SOLN
OPHTHALMIC | Status: DC | PRN
  Administered 2017-04-03: 1 via OPHTHALMIC

## 2017-04-03 MED ORDER — MOXIFLOXACIN HCL 0.5 % OP SOLN
OPHTHALMIC | Status: DC | PRN
  Administered 2017-04-03: 0.2 mL

## 2017-04-03 MED ORDER — BSS IO SOLN
INTRAOCULAR | Status: DC | PRN
  Administered 2017-04-03: 1 via INTRAOCULAR

## 2017-04-03 MED ORDER — MOXIFLOXACIN HCL 0.5 % OP SOLN: 1.0000 [drp] | OPHTHALMIC | Status: DC | PRN

## 2017-04-03 MED ORDER — MIDAZOLAM HCL 2 MG/2ML IJ SOLN
INTRAMUSCULAR | Status: DC | PRN
  Administered 2017-04-03: 2 mg via INTRAVENOUS

## 2017-04-03 MED ORDER — SODIUM HYALURONATE 23 MG/ML IO SOLN
INTRAOCULAR | Status: DC | PRN
  Administered 2017-04-03: 0.6 mL via INTRAOCULAR

## 2017-04-03 MED ORDER — POVIDONE-IODINE 5 % OP SOLN
OPHTHALMIC | Status: AC
  Filled 2017-04-03: qty 30

## 2017-04-03 MED ORDER — SODIUM CHLORIDE 0.9 % IV SOLN
INTRAVENOUS | Status: DC
  Administered 2017-04-03: 10:00:00 via INTRAVENOUS

## 2017-04-03 MED ORDER — ARMC OPHTHALMIC DILATING DROPS
OPHTHALMIC | Status: AC
  Administered 2017-04-03: 1 via OPHTHALMIC
  Filled 2017-04-03: qty 0.4

## 2017-04-03 MED ORDER — SODIUM HYALURONATE 23 MG/ML IO SOLN
INTRAOCULAR | Status: AC
  Filled 2017-04-03: qty 0.6

## 2017-04-03 MED ORDER — FENTANYL CITRATE (PF) 250 MCG/5ML IJ SOLN
INTRAMUSCULAR | Status: AC
  Filled 2017-04-03: qty 5

## 2017-04-03 MED ORDER — LIDOCAINE HCL (PF) 4 % IJ SOLN
INTRAOCULAR | Status: DC | PRN
  Administered 2017-04-03: .2 mL via OPHTHALMIC

## 2017-04-03 MED ORDER — EPINEPHRINE PF 1 MG/ML IJ SOLN
INTRAMUSCULAR | Status: AC
  Filled 2017-04-03: qty 1

## 2017-04-03 MED ORDER — SODIUM HYALURONATE 10 MG/ML IO SOLN
INTRAOCULAR | Status: DC | PRN
  Administered 2017-04-03: 0.55 mL via INTRAOCULAR

## 2017-04-03 MED ORDER — ARMC OPHTHALMIC DILATING DROPS
1.0000 "application " | OPHTHALMIC | Status: AC
  Administered 2017-04-03 (×3): 1 via OPHTHALMIC

## 2017-04-03 SURGICAL SUPPLY — 18 items
DISSECTOR HYDRO NUCLEUS 50X22 (MISCELLANEOUS) ×3 IMPLANT
GLOVE BIO SURGEON STRL SZ8 (GLOVE) ×3 IMPLANT
GLOVE BIOGEL M 6.5 STRL (GLOVE) ×3 IMPLANT
GLOVE SURG LX 7.5 STRW (GLOVE) ×2
GLOVE SURG LX STRL 7.5 STRW (GLOVE) ×1 IMPLANT
GOWN STRL REUS W/ TWL LRG LVL3 (GOWN DISPOSABLE) ×2 IMPLANT
GOWN STRL REUS W/TWL LRG LVL3 (GOWN DISPOSABLE) ×6
LABEL CATARACT MEDS ST (LABEL) ×3 IMPLANT
LENS IOL TECNIS ITEC 19.5 (Intraocular Lens) ×2 IMPLANT
PACK CATARACT (MISCELLANEOUS) ×3 IMPLANT
PACK CATARACT KING (MISCELLANEOUS) ×3 IMPLANT
PACK EYE AFTER SURG (MISCELLANEOUS) ×3 IMPLANT
SOL BAL SALT 15ML (MISCELLANEOUS) ×3
SOL BSS BAG (MISCELLANEOUS) ×3
SOLUTION BAL SALT 15ML (MISCELLANEOUS) IMPLANT
SOLUTION BSS BAG (MISCELLANEOUS) ×1 IMPLANT
WATER STERILE IRR 250ML POUR (IV SOLUTION) ×3 IMPLANT
WIPE NON LINTING 3.25X3.25 (MISCELLANEOUS) ×3 IMPLANT
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
## 2017-04-03 NOTE — Discharge Instructions (Signed)
Eye Surgery Discharge Instructions  Expect mild scratchy sensation or mild soreness. DO NOT RUB YOUR EYE!  The day of surgery:  Minimal physical activity, but bed rest is not required  No reading, computer work, or close hand work  No bending, lifting, or straining.  May watch TV  For 24 hours:  No driving, legal decisions, or alcoholic beverages  Safety precautions  Eat anything you prefer: It is better to start with liquids, then soup then solid foods.  _____ Eye patch should be worn until postoperative exam tomorrow.  ____ Solar shield eyeglasses should be worn for comfort in the sunlight/patch while sleeping  Resume all regular medications including aspirin or Coumadin if these were discontinued prior to surgery. You may shower, bathe, shave, or wash your hair. Tylenol may be taken for mild discomfort.  Call your doctor if you experience significant pain, nausea, or vomiting, fever > 101 or other signs of infection. [phone] or [phone] Specific instructions:  Follow-up Information    Eulogio Bear, MD Follow up.   Specialty:  Ophthalmology Why:  January 25 at 9:35am Contact information: [place] [address] [phone]

## 2017-04-03 NOTE — Anesthesia Postprocedure Evaluation (Signed)
Anesthesia Post Note  Patient: [name]  Procedure(s) Performed: CATARACT EXTRACTION PHACO AND INTRAOCULAR LENS PLACEMENT (IOC) (Left )  Patient location during evaluation: PACU Anesthesia Type: MAC Level of consciousness: awake and alert and oriented Pain management: pain level controlled Vital Signs Assessment: post-procedure vital signs reviewed and stable Respiratory status: spontaneous breathing Cardiovascular status: blood pressure returned to baseline Anesthetic complications: no     Last Vitals:  Vitals:   04/03/17 0927  BP: (!) 180/92  Pulse: (!) 106  Resp: 16  Temp: (!) 35.8 C    Last Pain:  Vitals:   04/03/17 0927  TempSrc: Tympanic  PainSc: 0-No pain                 Melis Trochez

## 2017-04-03 NOTE — Anesthesia Postprocedure Evaluation (Signed)
Anesthesia Post Note  Patient: [name]  Procedure(s) Performed: CATARACT EXTRACTION PHACO AND INTRAOCULAR LENS PLACEMENT (IOC) (Left )  Patient location during evaluation: Short Stay Anesthesia Type: MAC Level of consciousness: awake and alert and awake Pain management: pain level controlled Vital Signs Assessment: post-procedure vital signs reviewed and stable Respiratory status: spontaneous breathing Cardiovascular status: blood pressure returned to baseline Postop Assessment: no headache Anesthetic complications: no     Last Vitals:  Vitals:   04/03/17 0927  BP: (!) 180/92  Pulse: (!) 106  Resp: 16  Temp: (!) 35.8 C    Last Pain:  Vitals:   04/03/17 0927  TempSrc: Tympanic  PainSc: 0-No pain                 Philbert Riser

## 2017-04-03 NOTE — Transfer of Care (Signed)
Immediate Anesthesia Transfer of Care Note  Patient: [name]  Procedure(s) Performed: CATARACT EXTRACTION PHACO AND INTRAOCULAR LENS PLACEMENT (IOC) (Left )  Patient Location: PACU  Anesthesia Type:MAC  Level of Consciousness: awake and alert   Airway & Oxygen Therapy: Patient Spontanous Breathing  Post-op Assessment: Report given to RN  Post vital signs: Reviewed and stable  Last Vitals:  Vitals:   04/03/17 0927  BP: (!) 180/92  Pulse: (!) 106  Resp: 16  Temp: (!) 35.8 C    Last Pain:  Vitals:   04/03/17 0927  TempSrc: Tympanic  PainSc: 0-No pain         Complications: No apparent anesthesia complications

## 2017-04-03 NOTE — H&P (Signed)
The History and Physical notes are on paper, have been signed, and are to be scanned.   I have examined the patient and there are no changes to the H&P.   Benay Pillow 04/03/2017 10:49 AM

## 2017-04-03 NOTE — Op Note (Signed)
OPERATIVE NOTE  Zaid Tomes [id number] 04/03/2017   PREOPERATIVE DIAGNOSIS:  Nuclear sclerotic cataract left eye.  H25.12   POSTOPERATIVE DIAGNOSIS:    Nuclear sclerotic cataract left eye.     PROCEDURE:  Phacoemusification with posterior chamber intraocular lens placement of the left eye   LENS:   Implant Name Type Inv. Item Serial No. Manufacturer Lot No. LRB No. Used  LENS IOL DIOP 19.5 - [id number] Intraocular Lens LENS IOL DIOP 19.5 [id number] AMO  Left 1       PCB00 +19.5   ULTRASOUND TIME: 0 minutes 49 seconds.  CDE 8.86   SURGEON:  Benay Pillow, MD, MPH   ANESTHESIA:  Topical with tetracaine drops augmented with 1% preservative-free intracameral lidocaine.  ESTIMATED BLOOD LOSS: <1 mL   COMPLICATIONS:  None.   DESCRIPTION OF PROCEDURE:  The patient was identified in the holding room and transported to the operating room and placed in the supine position under the operating microscope.  The left eye was identified as the operative eye and it was prepped and draped in the usual sterile ophthalmic fashion.   A 1.0 millimeter clear-corneal paracentesis was made at the 5:00 position. 0.5 ml of preservative-free 1% lidocaine with epinephrine was injected into the anterior chamber.  The anterior chamber was filled with Healon 5 viscoelastic.  A 2.4 millimeter keratome was used to make a near-clear corneal incision at the 2:00 position.  A curvilinear capsulorrhexis was made with a cystotome and capsulorrhexis forceps.  Balanced salt solution was used to hydrodissect and hydrodelineate the nucleus.   Phacoemulsification was then used in stop and chop fashion to remove the lens nucleus and epinucleus.  The remaining cortex was then removed using the irrigation and aspiration handpiece. Healon was then placed into the capsular bag to distend it for lens placement.  A lens was then injected into the capsular bag.  The remaining viscoelastic was aspirated.   Wounds were hydrated  with balanced salt solution.  The anterior chamber was inflated to a physiologic pressure with balanced salt solution.  Intracameral vigamox 0.1 mL undiltued was injected into the eye and a drop placed onto the ocular surface.  No wound leaks were noted.  The patient was taken to the recovery room in stable condition without complications of anesthesia or surgery  Benay Pillow 04/03/2017, 11:28 AM

## 2017-04-03 NOTE — Anesthesia Post-op Follow-up Note (Signed)
Anesthesia QCDR form completed.        

## 2017-04-23 MED ORDER — ONDANSETRON HCL 4 MG/2ML IJ SOLN
4.0000 mg | Freq: Once | INTRAMUSCULAR | Status: DC | PRN
Start: 2017-04-23 — End: 2017-04-24

## 2017-04-23 MED ORDER — FENTANYL CITRATE (PF) 100 MCG/2ML IJ SOLN: 25.0000 ug | INTRAMUSCULAR | Status: DC | PRN

## 2017-04-24 ENCOUNTER — Ambulatory Visit: Payer: Medicaid Other | Admitting: Anesthesiology

## 2017-04-24 ENCOUNTER — Ambulatory Visit
Admission: RE | Admit: 2017-04-24 | Discharge: 2017-04-24 | Disposition: A | Discharge status: Home / Self Care | Payer: Medicaid Other | Source: Ambulatory Visit | Attending: Ophthalmology | Admitting: Ophthalmology

## 2017-04-24 ENCOUNTER — Encounter
Admission: RE | Disposition: A | Discharge status: Home / Self Care | Payer: Self-pay | Source: Ambulatory Visit | Attending: Ophthalmology

## 2017-04-24 ENCOUNTER — Encounter: Payer: Self-pay | Admitting: Emergency Medicine

## 2017-04-24 DIAGNOSIS — E119 Type 2 diabetes mellitus without complications: Secondary | ICD-10-CM | POA: Diagnosis not present

## 2017-04-24 DIAGNOSIS — N400 Enlarged prostate without lower urinary tract symptoms: Secondary | ICD-10-CM | POA: Insufficient documentation

## 2017-04-24 DIAGNOSIS — B1920 Unspecified viral hepatitis C without hepatic coma: Secondary | ICD-10-CM | POA: Insufficient documentation

## 2017-04-24 DIAGNOSIS — Z9849 Cataract extraction status, unspecified eye: Secondary | ICD-10-CM | POA: Diagnosis not present

## 2017-04-24 DIAGNOSIS — K219 Gastro-esophageal reflux disease without esophagitis: Secondary | ICD-10-CM | POA: Diagnosis not present

## 2017-04-24 DIAGNOSIS — F419 Anxiety disorder, unspecified: Secondary | ICD-10-CM | POA: Insufficient documentation

## 2017-04-24 DIAGNOSIS — K449 Diaphragmatic hernia without obstruction or gangrene: Secondary | ICD-10-CM | POA: Insufficient documentation

## 2017-04-24 DIAGNOSIS — Z87891 Personal history of nicotine dependence: Secondary | ICD-10-CM | POA: Diagnosis not present

## 2017-04-24 DIAGNOSIS — I10 Essential (primary) hypertension: Secondary | ICD-10-CM | POA: Diagnosis not present

## 2017-04-24 DIAGNOSIS — F329 Major depressive disorder, single episode, unspecified: Secondary | ICD-10-CM | POA: Diagnosis not present

## 2017-04-24 DIAGNOSIS — R062 Wheezing: Secondary | ICD-10-CM | POA: Insufficient documentation

## 2017-04-24 DIAGNOSIS — H2511 Age-related nuclear cataract, right eye: Secondary | ICD-10-CM | POA: Diagnosis present

## 2017-04-24 DIAGNOSIS — K7460 Unspecified cirrhosis of liver: Secondary | ICD-10-CM | POA: Insufficient documentation

## 2017-04-24 HISTORY — PX: CATARACT EXTRACTION W/PHACO: SHX586

## 2017-04-24 LAB — URINE DRUG SCREEN, QUALITATIVE (ARMC ONLY)
AMPHETAMINES, UR SCREEN: NOT DETECTED
Barbiturates, Ur Screen: NOT DETECTED
Benzodiazepine, Ur Scrn: POSITIVE — AB
Cannabinoid 50 Ng, Ur ~~LOC~~: NOT DETECTED
Cocaine Metabolite,Ur ~~LOC~~: NOT DETECTED
MDMA (ECSTASY) UR SCREEN: NOT DETECTED
Methadone Scn, Ur: POSITIVE — AB
OPIATE, UR SCREEN: NOT DETECTED
PHENCYCLIDINE (PCP) UR S: NOT DETECTED
Tricyclic, Ur Screen: NOT DETECTED

## 2017-04-24 LAB — GLUCOSE, CAPILLARY: GLUCOSE-CAPILLARY: 90 mg/dL (ref 65–99)

## 2017-04-24 SURGERY — PHACOEMULSIFICATION, CATARACT, WITH IOL INSERTION
Anesthesia: Monitor Anesthesia Care | Site: Eye | Laterality: Right | Wound class: Clean

## 2017-04-24 MED ORDER — POVIDONE-IODINE 5 % OP SOLN
OPHTHALMIC | Status: DC | PRN
  Administered 2017-04-24: 1 via OPHTHALMIC

## 2017-04-24 MED ORDER — MIDAZOLAM HCL 2 MG/2ML IJ SOLN
INTRAMUSCULAR | Status: AC
  Filled 2017-04-24: qty 2

## 2017-04-24 MED ORDER — MOXIFLOXACIN HCL 0.5 % OP SOLN
OPHTHALMIC | Status: AC
  Filled 2017-04-24: qty 3

## 2017-04-24 MED ORDER — ARMC OPHTHALMIC DILATING DROPS
1.0000 "application " | OPHTHALMIC | Status: DC | PRN
  Administered 2017-04-24 (×3): 1 via OPHTHALMIC

## 2017-04-24 MED ORDER — LIDOCAINE HCL (PF) 4 % IJ SOLN
INTRAMUSCULAR | Status: AC
  Filled 2017-04-24: qty 5

## 2017-04-24 MED ORDER — ONDANSETRON HCL 4 MG/2ML IJ SOLN
INTRAMUSCULAR | Status: DC | PRN
  Administered 2017-04-24: 4 mg via INTRAVENOUS

## 2017-04-24 MED ORDER — ARMC OPHTHALMIC DILATING DROPS
OPHTHALMIC | Status: AC
  Administered 2017-04-24: 1 via OPHTHALMIC
  Filled 2017-04-24: qty 0.4

## 2017-04-24 MED ORDER — FENTANYL CITRATE (PF) 100 MCG/2ML IJ SOLN
INTRAMUSCULAR | Status: AC
  Filled 2017-04-24: qty 2

## 2017-04-24 MED ORDER — POVIDONE-IODINE 5 % OP SOLN
OPHTHALMIC | Status: AC
  Filled 2017-04-24: qty 30

## 2017-04-24 MED ORDER — SODIUM HYALURONATE 23 MG/ML IO SOLN
INTRAOCULAR | Status: AC
  Filled 2017-04-24: qty 0.6

## 2017-04-24 MED ORDER — SODIUM HYALURONATE 23 MG/ML IO SOLN
INTRAOCULAR | Status: DC | PRN
  Administered 2017-04-24: 0.6 mL via INTRAOCULAR

## 2017-04-24 MED ORDER — CARBACHOL 0.01 % IO SOLN
INTRAOCULAR | Status: DC | PRN
  Administered 2017-04-24: 0.5 mL via INTRAOCULAR

## 2017-04-24 MED ORDER — FENTANYL CITRATE (PF) 100 MCG/2ML IJ SOLN
INTRAMUSCULAR | Status: DC | PRN
  Administered 2017-04-24: 50 ug via INTRAVENOUS

## 2017-04-24 MED ORDER — ONDANSETRON HCL 4 MG/2ML IJ SOLN
INTRAMUSCULAR | Status: AC
  Filled 2017-04-24: qty 2

## 2017-04-24 MED ORDER — MOXIFLOXACIN HCL 0.5 % OP SOLN
OPHTHALMIC | Status: DC | PRN
  Administered 2017-04-24: 0.2 mL via OPHTHALMIC

## 2017-04-24 MED ORDER — BSS IO SOLN
INTRAOCULAR | Status: DC | PRN
  Administered 2017-04-24: 12:00:00 via OPHTHALMIC

## 2017-04-24 MED ORDER — EPINEPHRINE PF 1 MG/ML IJ SOLN
INTRAMUSCULAR | Status: AC
  Filled 2017-04-24: qty 2

## 2017-04-24 MED ORDER — SODIUM HYALURONATE 10 MG/ML IO SOLN
INTRAOCULAR | Status: DC | PRN
  Administered 2017-04-24: 0.55 mL via INTRAOCULAR

## 2017-04-24 MED ORDER — MIDAZOLAM HCL 2 MG/2ML IJ SOLN
INTRAMUSCULAR | Status: DC | PRN
  Administered 2017-04-24: 1 mg via INTRAVENOUS

## 2017-04-24 MED ORDER — MOXIFLOXACIN HCL 0.5 % OP SOLN: 1.0000 [drp] | OPHTHALMIC | Status: DC | PRN

## 2017-04-24 MED ORDER — LIDOCAINE HCL (PF) 4 % IJ SOLN
INTRAOCULAR | Status: DC | PRN
  Administered 2017-04-24: 4 mL via OPHTHALMIC

## 2017-04-24 MED ORDER — SODIUM CHLORIDE 0.9 % IV SOLN
INTRAVENOUS | Status: DC
  Administered 2017-04-24: 11:00:00 via INTRAVENOUS

## 2017-04-24 SURGICAL SUPPLY — 16 items
DISSECTOR HYDRO NUCLEUS 50X22 (MISCELLANEOUS) ×3 IMPLANT
GLOVE BIO SURGEON STRL SZ8 (GLOVE) ×3 IMPLANT
GLOVE BIOGEL M 6.5 STRL (GLOVE) ×3 IMPLANT
GLOVE SURG LX 7.5 STRW (GLOVE) ×2
GLOVE SURG LX STRL 7.5 STRW (GLOVE) ×1 IMPLANT
GOWN STRL REUS W/ TWL LRG LVL3 (GOWN DISPOSABLE) ×2 IMPLANT
GOWN STRL REUS W/TWL LRG LVL3 (GOWN DISPOSABLE) ×6
LABEL CATARACT MEDS ST (LABEL) ×3 IMPLANT
LENS IOL TECNIS ITEC 20.5 (Intraocular Lens) ×2 IMPLANT
PACK CATARACT (MISCELLANEOUS) ×3 IMPLANT
PACK CATARACT KING (MISCELLANEOUS) ×3 IMPLANT
PACK EYE AFTER SURG (MISCELLANEOUS) ×3 IMPLANT
SOL BSS BAG (MISCELLANEOUS) ×3
SOLUTION BSS BAG (MISCELLANEOUS) ×1 IMPLANT
WATER STERILE IRR 250ML POUR (IV SOLUTION) ×3 IMPLANT
WIPE NON LINTING 3.25X3.25 (MISCELLANEOUS) ×3 IMPLANT

## 2017-04-24 NOTE — Op Note (Signed)
OPERATIVE NOTE  [name] [id number] 04/24/2017   PREOPERATIVE DIAGNOSIS:  Nuclear sclerotic cataract right eye.  H25.11   POSTOPERATIVE DIAGNOSIS:    Nuclear sclerotic cataract right eye.     PROCEDURE:  Phacoemusification with posterior chamber intraocular lens placement of the right eye   LENS:   Implant Name Type Inv. Item Serial No. Manufacturer Lot No. LRB No. Used  LENS IOL DIOP 20.5 - [id number] Intraocular Lens LENS IOL DIOP 20.5 [id number] AMO  Right 1       PCB00 +20.5   ULTRASOUND TIME: 0 minutes 45 seconds.  CDE 7.60   SURGEON:  Benay Pillow, MD, MPH  ANESTHESIOLOGIST: Anesthesiologist: Piscitello, Precious Haws, MD CRNA: Doreen Salvage, CRNA   ANESTHESIA:  Topical with tetracaine drops augmented with 1% preservative-free intracameral lidocaine.  ESTIMATED BLOOD LOSS: less than 1 mL.   COMPLICATIONS:  None.   DESCRIPTION OF PROCEDURE:  The patient was identified in the holding room and transported to the operating room and placed in the supine position under the operating microscope.  The right eye was identified as the operative eye and it was prepped and draped in the usual sterile ophthalmic fashion.   A 1.0 millimeter clear-corneal paracentesis was made at the 10:30 position. 0.5 ml of preservative-free 1% lidocaine with epinephrine was injected into the anterior chamber.  The anterior chamber was filled with Healon 5 viscoelastic.  A 2.4 millimeter keratome was used to make a near-clear corneal incision at the 8:00 position.  A curvilinear capsulorrhexis was made with a cystotome and capsulorrhexis forceps.  Balanced salt solution was used to hydrodissect and hydrodelineate the nucleus.   Phacoemulsification was then used in stop and chop fashion to remove the lens nucleus and epinucleus.  The remaining cortex was then removed using the irrigation and aspiration handpiece. Healon was then placed into the capsular bag to distend it for lens placement.  A lens  was then injected into the capsular bag.  The remaining viscoelastic was aspirated.   Wounds were hydrated with balanced salt solution.  The anterior chamber was inflated to a physiologic pressure with balanced salt solution.   Intracameral vigamox 0.1 mL undiluted was injected into the eye and a drop placed onto the ocular surface.  No wound leaks were noted.  The patient was taken to the recovery room in stable condition without complications of anesthesia or surgery  Benay Pillow 04/24/2017, 12:05 PM

## 2017-04-24 NOTE — Anesthesia Procedure Notes (Signed)
Procedure Name: MAC Date/Time: 04/24/2017 11:43 AM Performed by: Doreen Salvage, CRNA Pre-anesthesia Checklist: Patient identified, Emergency Drugs available, Suction available and Patient being monitored Patient Re-evaluated:Patient Re-evaluated prior to induction Oxygen Delivery Method: Nasal cannula
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
## 2017-04-24 NOTE — Transfer of Care (Signed)
Immediate Anesthesia Transfer of Care Note  Patient: [name]  Procedure(s) Performed: Procedure(s) with comments: CATARACT EXTRACTION PHACO AND INTRAOCULAR LENS PLACEMENT (IOC) (Right) - [place] 00:45.3 AP% 16.8 CDE 7.60 Fluid Pack Lot # [id number]  Patient Location: PHASE II  Anesthesia Type:MAC  Level of Consciousness: Awake, Alert, Oriented  Airway & Oxygen Therapy: Patient Spontanous Breathing and Patient on room air   Post-op Assessment: Report given to RN and Post -op Vital signs reviewed and stable  Post vital signs: Reviewed and stable  Last Vitals:  Vitals:   04/24/17 1207 04/24/17 1208  BP: (!) 163/84 (!) 163/84  Pulse: 91 91  Resp: 16 12  Temp: 36.7 C 36.7 C  SpO2: 24% 58%    Complications: No apparent anesthesia complications

## 2017-04-24 NOTE — H&P (Signed)
The History and Physical notes are on paper, have been signed, and are to be scanned.   I have examined the patient and there are no changes to the H&P.   [name] 04/24/2017 11:38 AM

## 2017-04-24 NOTE — Discharge Instructions (Signed)
FOLLOW DR. Melony Overly POSTOP EYE DROP INSTRUCTION SHEET AS REVIEWED.  Eye Surgery Discharge Instructions  Expect mild scratchy sensation or mild soreness. DO NOT RUB YOUR EYE!  The day of surgery:  Minimal physical activity, but bed rest is not required  No reading, computer work, or close hand work  No bending, lifting, or straining.  May watch TV  For 24 hours:  No driving, legal decisions, or alcoholic beverages  Safety precautions  Eat anything you prefer: It is better to start with liquids, then soup then solid foods.  _____ Eye patch should be worn until postoperative exam tomorrow.  ____ Solar shield eyeglasses should be worn for comfort in the sunlight/patch while sleeping  Resume all regular medications including aspirin or Coumadin if these were discontinued prior to surgery. You may shower, bathe, shave, or wash your hair. Tylenol may be taken for mild discomfort.  Call your doctor if you experience significant pain, nausea, or vomiting, fever > 101 or other signs of infection. [phone] or [phone] Specific instructions:  Follow-up Information    Eulogio Bear, MD Follow up.   Specialty:  Ophthalmology Why:  04/25/17 10:05 am in the [facility] office Contact information: [place] [address] [phone]

## 2017-04-24 NOTE — Anesthesia Post-op Follow-up Note (Signed)
Anesthesia QCDR form completed.        

## 2017-04-25 NOTE — Anesthesia Postprocedure Evaluation (Signed)
Anesthesia Post Note  Patient: [name]  Procedure(s) Performed: CATARACT EXTRACTION PHACO AND INTRAOCULAR LENS PLACEMENT (IOC) (Right Eye)  Patient location during evaluation: PACU Anesthesia Type: MAC Level of consciousness: awake and alert Pain management: pain level controlled Vital Signs Assessment: post-procedure vital signs reviewed and stable Respiratory status: spontaneous breathing, nonlabored ventilation, respiratory function stable and patient connected to nasal cannula oxygen Cardiovascular status: stable and blood pressure returned to baseline Postop Assessment: no apparent nausea or vomiting Anesthetic complications: no     Last Vitals:  Vitals:   04/24/17 1208 04/24/17 1215  BP: (!) 163/84 (!) 163/81  Pulse: 91 94  Resp: 12 14  Temp: 36.7 C   SpO2: 95% 96%    Last Pain:  Vitals:   04/24/17 1208  TempSrc: Oral                 Precious Haws [place]

## 2017-07-02 ENCOUNTER — Emergency Department: Payer: Medicaid Other

## 2017-07-02 ENCOUNTER — Encounter: Payer: Self-pay | Admitting: Emergency Medicine

## 2017-07-02 ENCOUNTER — Emergency Department
Admission: EM | Admit: 2017-07-02 | Discharge: 2017-07-02 | Disposition: A | Discharge status: Home / Self Care | Payer: Medicaid Other | Attending: Emergency Medicine | Admitting: Emergency Medicine

## 2017-07-02 ENCOUNTER — Other Ambulatory Visit: Payer: Self-pay

## 2017-07-02 DIAGNOSIS — Z79899 Other long term (current) drug therapy: Secondary | ICD-10-CM | POA: Diagnosis not present

## 2017-07-02 DIAGNOSIS — I10 Essential (primary) hypertension: Secondary | ICD-10-CM | POA: Insufficient documentation

## 2017-07-02 DIAGNOSIS — R0602 Shortness of breath: Secondary | ICD-10-CM | POA: Diagnosis present

## 2017-07-02 DIAGNOSIS — Z87891 Personal history of nicotine dependence: Secondary | ICD-10-CM | POA: Insufficient documentation

## 2017-07-02 DIAGNOSIS — E119 Type 2 diabetes mellitus without complications: Secondary | ICD-10-CM | POA: Insufficient documentation

## 2017-07-02 DIAGNOSIS — J441 Chronic obstructive pulmonary disease with (acute) exacerbation: Secondary | ICD-10-CM | POA: Diagnosis not present

## 2017-07-02 LAB — COMPREHENSIVE METABOLIC PANEL
ALT: 47 U/L (ref 17–63)
ANION GAP: 7 (ref 5–15)
AST: 61 U/L — ABNORMAL HIGH (ref 15–41)
Albumin: 3.8 g/dL (ref 3.5–5.0)
Alkaline Phosphatase: 143 U/L — ABNORMAL HIGH (ref 38–126)
BUN: 14 mg/dL (ref 6–20)
CHLORIDE: 110 mmol/L (ref 101–111)
CO2: 24 mmol/L (ref 22–32)
CREATININE: 0.62 mg/dL (ref 0.61–1.24)
Calcium: 9.3 mg/dL (ref 8.9–10.3)
Glucose, Bld: 130 mg/dL — ABNORMAL HIGH (ref 65–99)
Potassium: 3.7 mmol/L (ref 3.5–5.1)
SODIUM: 141 mmol/L (ref 135–145)
Total Bilirubin: 1.2 mg/dL (ref 0.3–1.2)
Total Protein: 8 g/dL (ref 6.5–8.1)

## 2017-07-02 LAB — CBC
HCT: 47.5 % (ref 40.0–52.0)
Hemoglobin: 15.8 g/dL (ref 13.0–18.0)
MCH: 33.4 pg (ref 26.0–34.0)
MCHC: 33.3 g/dL (ref 32.0–36.0)
MCV: 100.4 fL — AB (ref 80.0–100.0)
PLATELETS: 101 10*3/uL — AB (ref 150–440)
RBC: 4.73 MIL/uL (ref 4.40–5.90)
RDW: 13.6 % (ref 11.5–14.5)
WBC: 5.6 10*3/uL (ref 3.8–10.6)

## 2017-07-02 LAB — TROPONIN I: Troponin I: <0.03 ng/mL (ref <0.03)

## 2017-07-02 MED ORDER — HYDROCOD POLST-CPM POLST ER 10-8 MG/5ML PO SUER
5.0000 mL | Freq: Once | ORAL | Status: AC
  Administered 2017-07-02: 5 mL via ORAL
  Filled 2017-07-02: qty 5

## 2017-07-02 MED ORDER — IPRATROPIUM-ALBUTEROL 0.5-2.5 (3) MG/3ML IN SOLN
3.0000 mL | Freq: Once | RESPIRATORY_TRACT | Status: AC
  Administered 2017-07-02: 3 mL via RESPIRATORY_TRACT
  Filled 2017-07-02: qty 3

## 2017-07-02 MED ORDER — ALBUTEROL SULFATE HFA 108 (90 BASE) MCG/ACT IN AERS: INHALATION_SPRAY | RESPIRATORY_TRACT | 1 refills | Status: AC

## 2017-07-02 MED ORDER — METHYLPREDNISOLONE SODIUM SUCC 125 MG IJ SOLR
125.0000 mg | Freq: Once | INTRAMUSCULAR | Status: AC
  Administered 2017-07-02: 125 mg via INTRAVENOUS
  Filled 2017-07-02: qty 2

## 2017-07-02 MED ORDER — PREDNISONE 20 MG PO TABS: 60.0000 mg | ORAL_TABLET | Freq: Every day | ORAL | 0 refills | Status: DC

## 2017-07-02 NOTE — Discharge Instructions (Signed)
We believe that your symptoms are caused today by an exacerbation of your COPD.  Please take the prescribed medications and any medications that you have at home for your COPD.  Follow up with your doctor as recommended.  If you develop any new or worsening symptoms, including but not limited to fever, persistent vomiting, worsening shortness of breath, or other symptoms that concern you, please return to the Emergency Department immediately.  

## 2017-07-02 NOTE — ED Triage Notes (Signed)
Pt arrived to the ED via EMS from home complaints of shortness of breath and cough. Pt reports that he saw his PCP 2 days ago and was prescribed musinex for his cough but it has not helped. Pt is a COPD'er and has multiple chronic diseases. Pt is AOx4 actively coughing.
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
## 2017-07-11 ENCOUNTER — Emergency Department: Payer: Medicaid Other

## 2017-07-11 ENCOUNTER — Inpatient Hospital Stay
Admission: EM | Admit: 2017-07-11 | Discharge: 2017-07-15 | DRG: 189 | Disposition: A | Discharge status: Home / Self Care | Payer: Medicaid Other | Attending: Internal Medicine | Admitting: Internal Medicine

## 2017-07-11 ENCOUNTER — Other Ambulatory Visit: Payer: Self-pay

## 2017-07-11 ENCOUNTER — Encounter: Payer: Self-pay | Admitting: Radiology

## 2017-07-11 DIAGNOSIS — R402413 Glasgow coma scale score 13-15, at hospital admission: Secondary | ICD-10-CM | POA: Diagnosis present

## 2017-07-11 DIAGNOSIS — Y92230 Patient room in hospital as the place of occurrence of the external cause: Secondary | ICD-10-CM | POA: Diagnosis not present

## 2017-07-11 DIAGNOSIS — Z87891 Personal history of nicotine dependence: Secondary | ICD-10-CM | POA: Diagnosis not present

## 2017-07-11 DIAGNOSIS — J9601 Acute respiratory failure with hypoxia: Secondary | ICD-10-CM | POA: Diagnosis present

## 2017-07-11 DIAGNOSIS — D696 Thrombocytopenia, unspecified: Secondary | ICD-10-CM | POA: Diagnosis present

## 2017-07-11 DIAGNOSIS — J181 Lobar pneumonia, unspecified organism: Secondary | ICD-10-CM | POA: Diagnosis present

## 2017-07-11 DIAGNOSIS — G8929 Other chronic pain: Secondary | ICD-10-CM | POA: Diagnosis present

## 2017-07-11 DIAGNOSIS — I10 Essential (primary) hypertension: Secondary | ICD-10-CM | POA: Diagnosis present

## 2017-07-11 DIAGNOSIS — Y95 Nosocomial condition: Secondary | ICD-10-CM | POA: Diagnosis present

## 2017-07-11 DIAGNOSIS — Z7952 Long term (current) use of systemic steroids: Secondary | ICD-10-CM

## 2017-07-11 DIAGNOSIS — T8089XA Other complications following infusion, transfusion and therapeutic injection, initial encounter: Secondary | ICD-10-CM | POA: Diagnosis not present

## 2017-07-11 DIAGNOSIS — F419 Anxiety disorder, unspecified: Secondary | ICD-10-CM | POA: Diagnosis present

## 2017-07-11 DIAGNOSIS — J9602 Acute respiratory failure with hypercapnia: Secondary | ICD-10-CM | POA: Diagnosis present

## 2017-07-11 DIAGNOSIS — M7022 Olecranon bursitis, left elbow: Secondary | ICD-10-CM | POA: Diagnosis present

## 2017-07-11 DIAGNOSIS — K7460 Unspecified cirrhosis of liver: Secondary | ICD-10-CM | POA: Diagnosis present

## 2017-07-11 DIAGNOSIS — K219 Gastro-esophageal reflux disease without esophagitis: Secondary | ICD-10-CM | POA: Diagnosis present

## 2017-07-11 DIAGNOSIS — J440 Chronic obstructive pulmonary disease with acute lower respiratory infection: Secondary | ICD-10-CM | POA: Diagnosis present

## 2017-07-11 DIAGNOSIS — J189 Pneumonia, unspecified organism: Secondary | ICD-10-CM

## 2017-07-11 DIAGNOSIS — Z9981 Dependence on supplemental oxygen: Secondary | ICD-10-CM | POA: Diagnosis not present

## 2017-07-11 DIAGNOSIS — R609 Edema, unspecified: Secondary | ICD-10-CM

## 2017-07-11 DIAGNOSIS — K766 Portal hypertension: Secondary | ICD-10-CM | POA: Diagnosis present

## 2017-07-11 DIAGNOSIS — K625 Hemorrhage of anus and rectum: Secondary | ICD-10-CM | POA: Diagnosis present

## 2017-07-11 DIAGNOSIS — E7220 Disorder of urea cycle metabolism, unspecified: Secondary | ICD-10-CM | POA: Diagnosis present

## 2017-07-11 DIAGNOSIS — B1920 Unspecified viral hepatitis C without hepatic coma: Secondary | ICD-10-CM | POA: Diagnosis present

## 2017-07-11 DIAGNOSIS — M7989 Other specified soft tissue disorders: Secondary | ICD-10-CM | POA: Diagnosis present

## 2017-07-11 DIAGNOSIS — J441 Chronic obstructive pulmonary disease with (acute) exacerbation: Secondary | ICD-10-CM | POA: Diagnosis present

## 2017-07-11 DIAGNOSIS — R739 Hyperglycemia, unspecified: Secondary | ICD-10-CM | POA: Diagnosis present

## 2017-07-11 DIAGNOSIS — Z7951 Long term (current) use of inhaled steroids: Secondary | ICD-10-CM

## 2017-07-11 DIAGNOSIS — Y848 Other medical procedures as the cause of abnormal reaction of the patient, or of later complication, without mention of misadventure at the time of the procedure: Secondary | ICD-10-CM | POA: Diagnosis not present

## 2017-07-11 DIAGNOSIS — T380X5A Adverse effect of glucocorticoids and synthetic analogues, initial encounter: Secondary | ICD-10-CM | POA: Diagnosis present

## 2017-07-11 DIAGNOSIS — Z79899 Other long term (current) drug therapy: Secondary | ICD-10-CM

## 2017-07-11 DIAGNOSIS — Z79891 Long term (current) use of opiate analgesic: Secondary | ICD-10-CM

## 2017-07-11 HISTORY — DX: Unspecified viral hepatitis C without hepatic coma: B19.20

## 2017-07-11 HISTORY — DX: Essential (primary) hypertension: I10

## 2017-07-11 LAB — CBC WITH DIFFERENTIAL/PLATELET
BASOS ABS: 0.1 10*3/uL (ref 0–0.1)
BASOS PCT: 1 %
EOS ABS: 0.2 10*3/uL (ref 0–0.7)
EOS PCT: 2 %
HEMATOCRIT: 44.4 % (ref 40.0–52.0)
Hemoglobin: 14.8 g/dL (ref 13.0–18.0)
Lymphocytes Relative: 21 %
Lymphs Abs: 1.9 10*3/uL (ref 1.0–3.6)
MCH: 33.9 pg (ref 26.0–34.0)
MCHC: 33.4 g/dL (ref 32.0–36.0)
MCV: 101.5 fL — ABNORMAL HIGH (ref 80.0–100.0)
MONO ABS: 1.3 10*3/uL — AB (ref 0.2–1.0)
MONOS PCT: 14 %
NEUTROS ABS: 5.6 10*3/uL (ref 1.4–6.5)
Neutrophils Relative %: 62 %
PLATELETS: 109 10*3/uL — AB (ref 150–440)
RBC: 4.37 MIL/uL — ABNORMAL LOW (ref 4.40–5.90)
RDW: 13.3 % (ref 11.5–14.5)
WBC: 8.9 10*3/uL (ref 3.8–10.6)

## 2017-07-11 LAB — BASIC METABOLIC PANEL
ANION GAP: 5 (ref 5–15)
BUN: 12 mg/dL (ref 6–20)
CALCIUM: 8.7 mg/dL — AB (ref 8.9–10.3)
CO2: 26 mmol/L (ref 22–32)
CREATININE: 0.94 mg/dL (ref 0.61–1.24)
Chloride: 110 mmol/L (ref 101–111)
Glucose, Bld: 182 mg/dL — ABNORMAL HIGH (ref 65–99)
Potassium: 4.3 mmol/L (ref 3.5–5.1)
SODIUM: 141 mmol/L (ref 135–145)

## 2017-07-11 LAB — PROTIME-INR
INR: 1.06
PROTHROMBIN TIME: 13.7 s (ref 11.4–15.2)

## 2017-07-11 LAB — BLOOD GAS, VENOUS
Acid-base deficit: 2.4 mmol/L — ABNORMAL HIGH (ref 0.0–2.0)
BICARBONATE: 27.8 mmol/L (ref 20.0–28.0)
O2 Saturation: 79.4 %
PATIENT TEMPERATURE: 37
PH VEN: 7.22 — AB (ref 7.250–7.430)
pCO2, Ven: 68 mmHg — ABNORMAL HIGH (ref 44.0–60.0)
pO2, Ven: 53 mmHg — ABNORMAL HIGH (ref 32.0–45.0)

## 2017-07-11 LAB — LACTIC ACID, PLASMA: LACTIC ACID, VENOUS: 1.4 mmol/L (ref 0.5–1.9)

## 2017-07-11 LAB — TROPONIN I: TROPONIN I: 0.03 ng/mL — AB (ref <0.03)

## 2017-07-11 LAB — BRAIN NATRIURETIC PEPTIDE: B Natriuretic Peptide: 179 pg/mL — ABNORMAL HIGH (ref 0.0–100.0)

## 2017-07-11 LAB — ETHANOL: Alcohol, Ethyl (B): <10 mg/dL (ref <10)

## 2017-07-11 LAB — APTT: aPTT: 30 seconds (ref 24–36)

## 2017-07-11 MED ORDER — IOPAMIDOL (ISOVUE-370) INJECTION 76%
125.0000 mL | Freq: Once | INTRAVENOUS | Status: AC | PRN
  Administered 2017-07-11: 125 mL via INTRAVENOUS

## 2017-07-11 MED ORDER — HYDROMORPHONE HCL 1 MG/ML IJ SOLN
0.5000 mg | Freq: Once | INTRAMUSCULAR | Status: AC
  Administered 2017-07-11: 0.5 mg via INTRAVENOUS
  Filled 2017-07-11: qty 1

## 2017-07-11 MED ORDER — PIPERACILLIN-TAZOBACTAM 3.375 G IVPB 30 MIN
3.3750 g | Freq: Once | INTRAVENOUS | Status: AC
Start: 2017-07-11 — End: 2017-07-12
  Administered 2017-07-12: 3.375 g via INTRAVENOUS
  Filled 2017-07-11: qty 50

## 2017-07-11 MED ORDER — VANCOMYCIN HCL IN DEXTROSE 1-5 GM/200ML-% IV SOLN
1000.0000 mg | Freq: Once | INTRAVENOUS | Status: AC
  Administered 2017-07-12: 1000 mg via INTRAVENOUS
  Filled 2017-07-11: qty 200

## 2017-07-11 NOTE — ED Notes (Signed)
Pt taken off BiPAP and placed on 5L [place]. Pt talking and continues to reports back pain. [place] at bedside.

## 2017-07-11 NOTE — H&P (Signed)
[facility] NAME: [name]    MR#:  [id number]  DATE OF BIRTH:  11-07-1955  DATE OF ADMISSION:  07/11/2017  PRIMARY CARE PHYSICIAN: Alene Mires Elyse Jarvis, MD   REQUESTING/REFERRING PHYSICIAN: Jimmye Norman, MD  CHIEF COMPLAINT:   Chief Complaint  Patient presents with  . Respiratory Distress    HISTORY OF PRESENT ILLNESS:  [name]  is a 62 y.o. male who presents with acute respiratory distress.  Patient was found by EMS to be very short of breath, wheezing.  He has a history of COPD, and was put on CPAP by EMS and brought to the ED.  Here is found to have pneumonia, and it was initially hypoxic and hypercarbic.  He was placed on BiPAP.  After some time he was weaned off, but required BiPAP to be put back on due to his respiratory effort.  Hospitalist were called for admission  PAST MEDICAL HISTORY:   Past Medical History:  Diagnosis Date  . Anxiety   . Cirrhosis ([place])   . COPD (chronic obstructive pulmonary disease) ([place])   . GERD (gastroesophageal reflux disease)   . Hepatitis C   . HTN (hypertension)      PAST SURGICAL HISTORY:   Past Surgical History:  Procedure Laterality Date  . LIVER BIOPSY    . TONSILLECTOMY       SOCIAL HISTORY:   Social History   Tobacco Use  . Smoking status: Former Smoker  Substance Use Topics  . Alcohol use: Not Currently     FAMILY HISTORY:   Family History  Problem Relation Age of Onset  . Prostate cancer Father   . Kidney disease Father   . Cancer Father      DRUG ALLERGIES:  No Known Allergies  MEDICATIONS AT HOME:   Prior to Admission medications   Not on File    REVIEW OF SYSTEMS:  Review of Systems  Constitutional: Negative for chills, fever, malaise/fatigue and weight loss.  HENT: Negative for ear pain, hearing loss and tinnitus.   Eyes: Negative for blurred vision, double vision, pain and redness.  Respiratory: Positive for cough,  shortness of breath and wheezing. Negative for hemoptysis.   Cardiovascular: Negative for chest pain, palpitations, orthopnea and leg swelling.  Gastrointestinal: Negative for abdominal pain, constipation, diarrhea, nausea and vomiting.  Genitourinary: Negative for dysuria, frequency and hematuria.  Musculoskeletal: Negative for back pain, joint pain and neck pain.  Skin:       No acne, rash, or lesions  Neurological: Negative for dizziness, tremors, focal weakness and weakness.  Endo/Heme/Allergies: Negative for polydipsia. Does not bruise/bleed easily.  Psychiatric/Behavioral: Negative for depression. The patient is not nervous/anxious and does not have insomnia.      VITAL SIGNS:   Vitals:   07/11/17 2245 07/11/17 2250 07/11/17 2256 07/11/17 2300  BP:    134/77  Pulse: (!) 103 99 100 (!) 104  Resp: 18 (!) 25 18 (!) 29  SpO2: (!) 87% 90% (!) 88% 95%  Weight:       Wt Readings from Last 3 Encounters:  07/11/17 77.8 kg (171 lb 8 oz)    PHYSICAL EXAMINATION:  Physical Exam  Vitals reviewed. Constitutional: He is oriented to person, place, and time. He appears well-developed and well-nourished. No distress.  HENT:  Head: Normocephalic and atraumatic.  Mouth/Throat: Oropharynx is clear and moist.  Eyes: Pupils are equal, round, and reactive to light. Conjunctivae and EOM are normal. No scleral  icterus.  Neck: Normal range of motion. Neck supple. No JVD present. No thyromegaly present.  Cardiovascular: Regular rhythm and intact distal pulses. Exam reveals no gallop and no friction rub.  No murmur heard. tachycardic  Respiratory: He is in respiratory distress. He has wheezes. He has no rales.  GI: Soft. Bowel sounds are normal. He exhibits no distension. There is no tenderness.  Musculoskeletal: Normal range of motion. He exhibits no edema.  No arthritis, no gout  Lymphadenopathy:    He has no cervical adenopathy.  Neurological: He is alert and oriented to person, place, and  time. No cranial nerve deficit.  No dysarthria, no aphasia  Skin: Skin is warm and dry. No rash noted. No erythema.  Psychiatric: He has a normal mood and affect. His behavior is normal. Judgment and thought content normal.    LABORATORY PANEL:   CBC Recent Labs  Lab 07/11/17 2016  WBC 8.9  HGB 14.8  HCT 44.4  PLT 109*   ------------------------------------------------------------------------------------------------------------------  Chemistries  Recent Labs  Lab 07/11/17 2016  NA 141  K 4.3  CL 110  CO2 26  GLUCOSE 182*  BUN 12  CREATININE 0.94  CALCIUM 8.7*   ------------------------------------------------------------------------------------------------------------------  Cardiac Enzymes Recent Labs  Lab 07/11/17 2016  TROPONINI 0.03*   ------------------------------------------------------------------------------------------------------------------  RADIOLOGY:  US Venous Img Upper Uni Left  Result Date: 07/11/2017 CLINICAL DATA:  62 year old male with left upper extremity edema and pain. EXAM: Left UPPER EXTREMITY VENOUS DOPPLER ULTRASOUND TECHNIQUE: Gray-scale sonography with graded compression, as well as color Doppler and duplex ultrasound were performed to evaluate the upper extremity deep venous system from the level of the subclavian vein and including the jugular, axillary, basilic, radial, ulnar and upper cephalic vein. Spectral Doppler was utilized to evaluate flow at rest and with distal augmentation maneuvers. COMPARISON:  None. FINDINGS: Patient could not tolerate the study. The study had to the terminated and resumed an hour later. Contralateral Subclavian Vein: Respiratory phasicity is normal and symmetric with the symptomatic side. No evidence of thrombus. Normal compressibility. Internal Jugular Vein: No evidence of thrombus. Normal compressibility, respiratory phasicity and response to augmentation. Subclavian Vein: No evidence of thrombus. Normal  compressibility, respiratory phasicity and response to augmentation. Axillary Vein: No evidence of thrombus. Normal compressibility, respiratory phasicity and response to augmentation. Cephalic Vein: No evidence of thrombus. Normal compressibility, respiratory phasicity and response to augmentation. Basilic Vein: No evidence of thrombus. Normal compressibility, respiratory phasicity and response to augmentation. Brachial Veins: No evidence of thrombus. Normal compressibility, respiratory phasicity and response to augmentation. Radial Veins: No evidence of thrombus. Normal compressibility, respiratory phasicity and response to augmentation. Ulnar Veins: No evidence of thrombus. Normal compressibility, respiratory phasicity and response to augmentation. Venous Reflux:  None visualized. Other Findings:  None visualized. IMPRESSION: No evidence of DVT within the left upper extremity. Electronically Signed   By: Anner Crete M.D.   On: 07/11/2017 23:27   Dg Chest Port 1 View  Result Date: 07/11/2017 CLINICAL DATA:  Dyspnea.  Hypoxia.  Bronchospasm. EXAM: PORTABLE CHEST 1 VIEW COMPARISON:  None. FINDINGS: Heart size is normal. Changes COPD are noted. Right middle lobe airspace disease is present. No definite effusions are present. Atherosclerotic changes are noted at the aortic arch. IMPRESSION: 1. Right middle lobe airspace disease concerning for lobar pneumonia. 2. Changes of COPD. 3. Aortic atherosclerosis. 4. No evidence for congestive heart failure. Electronically Signed   By: San Morelle M.D.   On: 07/11/2017 20:29   Ct Angio Chest/abd/pel For  Dissection W And/or Wo Contrast  Result Date: 07/11/2017 CLINICAL DATA:  Cyanotic with bronchospasm EXAM: CT ANGIOGRAPHY CHEST, ABDOMEN AND PELVIS TECHNIQUE: Multidetector CT imaging through the chest, abdomen and pelvis was performed using the standard protocol during bolus administration of intravenous contrast. Multiplanar reconstructed images and MIPs  were obtained and reviewed to evaluate the vascular anatomy. CONTRAST:  189mL ISOVUE-370 IOPAMIDOL (ISOVUE-370) INJECTION 76% COMPARISON:  Chest x-ray 07/11/2017, 07/02/2017, CT 09/09/2016, MRI 08/04/2015 FINDINGS: CTA CHEST FINDINGS Cardiovascular: Non contrasted images of the chest demonstrate no intramural hematoma. Moderate aortic atherosclerosis. Coronary vascular calcification. Normal heart size. No pericardial effusion. No dissection is seen.  No aneurysm. Mediastinum/Nodes: Midline trachea. No thyroid mass. Prominent subcarinal lymph node measuring 13 mm. No significantly enlarged hilar nodes. Esophagus within normal limits. Lungs/Pleura: Severe emphysema. Multifocal consolidations and ground-glass densities within the right lower lobe, right middle lobe and bilateral upper lobes. Small posterior calcified pleural plaques. No significant effusion. No pneumothorax. Musculoskeletal: Chronic compression deformities at T6, T7, T8 and T11. No suspicious bone lesion. Review of the MIP images confirms the above findings. CTA ABDOMEN AND PELVIS FINDINGS VASCULAR Aorta: No aneurysm or dissection. Moderate severe aortic atherosclerosis. Celiac: Moderate stenosis at the origin.  Distal vascular patency. SMA: No significant stenosis.  Distal vascular patency. Renals: Single right and single left renal arteries. No significant stenosis on the left. Mild stenosis at the origin of the right renal artery. IMA: Calcified origin.  Distal vascular patency. Inflow: Moderate severe diffuse atherosclerotic vascular disease of the aortoiliac bifurcation. Mild disease of the left external iliac and common femoral arteries without occlusion or high-grade stenosis. Moderate diffuse disease of the right external iliac without occlusion. Perisplenic varices. Review of the MIP images confirms the above findings. NON-VASCULAR Hepatobiliary: Cirrhotic morphology of the liver. Multiple calcified gallstones. No biliary dilatation Pancreas:  Unremarkable. No pancreatic ductal dilatation or surrounding inflammatory changes. Spleen: Normal in size without focal abnormality. Adrenals/Urinary Tract: Adrenal glands are within normal limits. No hydronephrosis. Bladder normal. Stomach/Bowel: Nonenlarged stomach. No dilated small bowel. Hyperdense foci within the rectosigmoid colon. No colon wall thickening. Lymphatic: No significantly enlarged lymph nodes. Reproductive: Prostate is unremarkable. Other: Negative for free air or free fluid. Musculoskeletal: Moderate severe degenerative changes L3 through S1. Review of the MIP images confirms the above findings. IMPRESSION: 1. Negative for aortic aneurysm or dissection. 2. Severe emphysema. Multifocal ground-glass densities and consolidations, most suspicious for multifocal pneumonia. 3. Small hyperdense foci within the rectosigmoid colon, favor radiopaque ingested material over small foci of hemorrhage but cannot be definitive in the absence of non contrasted images through this region. 4. Cirrhosis of the liver with evidence of portal hypertension 5. Gallstones Electronically Signed   By: Donavan Foil M.D.   On: 07/11/2017 22:54    EKG:   Orders placed or performed during the hospital encounter of 07/11/17  . ED EKG  . ED EKG  . EKG 12-Lead  . EKG 12-Lead    IMPRESSION AND PLAN:  Principal Problem:   Acute respiratory failure with hypoxia and hypercapnia ([name]) -patient required BiPAP, it was weaned off in the ED but he required to be put back on given his respiratory effort.  Respiratory failure is due to pneumonia and COPD exacerbation.  See treatment below Active Problems:   HCAP (healthcare-associated pneumonia) -IV antibiotics, other treatment as below   COPD with acute exacerbation ([name]) -IV Solu-Medrol, antibiotics as above, duo nebs and antitussive   HTN (hypertension) -continue home meds   Cirrhosis ([place]) -avoid hepatotoxins  Chart review performed and case discussed with ED  provider. Labs, imaging and/or ECG reviewed by provider and discussed with patient/family. Management plans discussed with the patient and/or family.  DVT PROPHYLAXIS: SubQ lovenox  GI PROPHYLAXIS: None  ADMISSION STATUS: Inpatient  CODE STATUS: Full  TOTAL CRITICAL CARE TIME TAKING CARE OF THIS PATIENT: 50 minutes.   Miyuki Rzasa Union 07/11/2017, 11:45 PM  [employer]  [phone]  CC: Primary care physician; Theotis Burrow, MD  Note:  This document was prepared using Dragon voice recognition software and may include unintentional dictation errors.

## 2017-07-11 NOTE — ED Notes (Signed)
Pt not maintaining oxygen saturation on 5L and pt is having increased WOB. Per MD verbal order pt placed back on BiPAP.

## 2017-07-11 NOTE — ED Triage Notes (Signed)
No known hx  Pt was found on the side of the road. Pt was cyanotic. (+) for broncospasms per EMS.  5 Duonebs and CPAP in place upon arrival. PT reports some relief but oxygen saturation remains at 86%. Pt is talking and answering questions upon arrival. Color [place] appropriate.

## 2017-07-11 NOTE — Progress Notes (Signed)
Placed pt off the bipap on n/c 4lpm per dr.williams tolerating well sat 93%

## 2017-07-11 NOTE — ED Provider Notes (Signed)
[facility] Emergency Department Provider Note     Level V caveat: History/ROS limited by altered mental status  Time seen: ----------------------------------------- 8:11 PM on 07/11/2017 -----------------------------------------   I have reviewed the triage vital signs and the nursing notes.  HISTORY   Chief Complaint Respiratory Distress    HPI [name] is a 62 y.o. male with no known past medical history who presents to the ED for respiratory distress.  No information is known about the patient.  He was found on the side of the road, cyanotic with audible bronchospasm according to EMS.  5 duo nebs and CPAP was in place upon arrival.  Patient reports some relief but oxygen saturations remain in the mid 80s.  Patient arrives talking and answering questions but does appear lethargic.  No past medical history on file.  There are no active problems to display for this patient.   Allergies Patient has no allergy information on record.  Social History Social History   Tobacco Use  . Smoking status: Not on file  Substance Use Topics  . Alcohol use: Not on file  . Drug use: Not on file   Review of Systems Respiratory: Positive for shortness of breath All systems otherwise unknown at this time  ____________________________________________   PHYSICAL EXAM:  VITAL SIGNS: ED Triage Vitals [07/11/17 2009]  Enc Vitals Group     BP      Pulse      Resp      Temp      Temp src      SpO2 92 %     Weight      Height      Head Circumference      Peak Flow      Pain Score      Pain Loc      Pain Edu?      Excl. in [place]?    Constitutional: Lethargic but arouses to verbal stimuli, moderate distress Eyes: Conjunctivae are normal. Normal extraocular movements. ENT   Head: Normocephalic and atraumatic.   Nose: No congestion/rhinnorhea.   Mouth/Throat: Mucous membranes are moist.   Neck: No stridor. Cardiovascular: Rapid  rate, regular rhythm. No murmurs, rubs, or gallops. Respiratory: Tachypnea with rhonchi bilaterally Gastrointestinal: Distended, nontender, normal bowel sounds Musculoskeletal: Left upper extremity tenderness is noted, there is extensive erythema and edema from the left elbow down the left arm, the upper arm appears to be normal, bilateral lower extremity pitting edema is noted Neurologic:  Normal speech and language. No gross focal neurologic deficits are appreciated.  Skin:  Skin is warm, dry and intact.  Pallor is noted Psychiatric: Mood and affect are normal.  ____________________________________________  EKG: Interpreted by me.  Sinus tachycardia with a rate of 126 bpm, PVCs, some ST depressions are noted, borderline long QT, normal axis  ____________________________________________  ED COURSE:  As part of my medical decision making, I reviewed the following data within the [facility] History obtained from family if available, nursing notes, old chart and ekg, as well as notes from prior ED visits. Patient presented for acute respiratory distress and is placed on BiPAP, we will assess with labs and imaging as indicated at this time.   Procedures ____________________________________________   LABS (pertinent positives/negatives)  Labs Reviewed  CBC WITH DIFFERENTIAL/PLATELET - Abnormal; Notable for the following components:      Result Value   RBC 4.37 (*)    MCV 101.5 (*)    Platelets 109 (*)  Monocytes Absolute 1.3 (*)    All other components within normal limits  BASIC METABOLIC PANEL - Abnormal; Notable for the following components:   Glucose, Bld 182 (*)    Calcium 8.7 (*)    All other components within normal limits  BRAIN NATRIURETIC PEPTIDE - Abnormal; Notable for the following components:   B Natriuretic Peptide 179.0 (*)    All other components within normal limits  TROPONIN I - Abnormal; Notable for the following components:   Troponin I 0.03  (*)    All other components within normal limits  BLOOD GAS, VENOUS - Abnormal; Notable for the following components:   pH, Ven 7.22 (*)    pCO2, Ven 68 (*)    pO2, Ven 53.0 (*)    Acid-base deficit 2.4 (*)    All other components within normal limits  CULTURE, BLOOD (ROUTINE X 2)  CULTURE, BLOOD (ROUTINE X 2)  ETHANOL  PROTIME-INR  APTT  LACTIC ACID, PLASMA  URINE DRUG SCREEN, QUALITATIVE ([facility] ONLY)   CRITICAL CARE Performed by: Laurence Aly   Total critical care time: 30 minutes  Critical care time was exclusive of separately billable procedures and treating other patients.  Critical care was necessary to treat or prevent imminent or life-threatening deterioration.  Critical care was time spent personally by me on the following activities: development of treatment plan with patient and/or surrogate as well as nursing, discussions with consultants, evaluation of patient's response to treatment, examination of patient, obtaining history from patient or surrogate, ordering and performing treatments and interventions, ordering and review of laboratory studies, ordering and review of radiographic studies, pulse oximetry and re-evaluation of patient's condition.  RADIOLOGY Images were viewed by me  Chest x-ray IMPRESSION: 1. Right middle lobe airspace disease concerning for lobar pneumonia. 2. Changes of COPD. 3. Aortic atherosclerosis. 4. No evidence for congestive heart failure. IMPRESSION: 1. Negative for aortic aneurysm or dissection. 2. Severe emphysema. Multifocal ground-glass densities and consolidations, most suspicious for multifocal pneumonia. 3. Small hyperdense foci within the rectosigmoid colon, favor radiopaque ingested material over small foci of hemorrhage but cannot be definitive in the absence of non contrasted images through this region. 4. Cirrhosis of the liver with evidence of portal hypertension 5.  Gallstones  ____________________________________________  DIFFERENTIAL DIAGNOSIS   COPD, pneumonia, pneumothorax, PE, MI, CHF  FINAL ASSESSMENT AND PLAN  acute respiratory failure with hypoxia and hypercapnia, multifocal pneumonia   Plan: The patient had presented for respiratory distress. Patient's labs did reveal acute hypercarbic respiratory failure with hypoxia and hypercapnia.  We did order IV antibiotics for what appeared on his x-ray to be a right middle lobe lobar pneumonia.  Patient's CT chest for dissection was most worrisome for multifocal pneumonia.  He was started on Vanco and Zosyn.  I did try to wean him off of BiPAP but he could not tolerate this, we will place him back on BiPAP and admit to the ICU.   Laurence Aly, MD   Note: This note was generated in part or whole with voice recognition software. Voice recognition is usually quite accurate but there are transcription errors that can and very often do occur. I apologize for any typographical errors that were not detected and corrected.     Earleen Newport, MD 07/11/17 2258

## 2017-07-11 NOTE — Progress Notes (Signed)
vbg results notifoed dr.wiiliams

## 2017-07-12 ENCOUNTER — Inpatient Hospital Stay: Payer: Medicaid Other

## 2017-07-12 ENCOUNTER — Encounter: Payer: Self-pay | Admitting: *Deleted

## 2017-07-12 DIAGNOSIS — J9601 Acute respiratory failure with hypoxia: Principal | ICD-10-CM

## 2017-07-12 DIAGNOSIS — J9602 Acute respiratory failure with hypercapnia: Secondary | ICD-10-CM

## 2017-07-12 LAB — BLOOD GAS, ARTERIAL
ACID-BASE EXCESS: 4.3 mmol/L — AB (ref 0.0–2.0)
BICARBONATE: 30.3 mmol/L — AB (ref 20.0–28.0)
Delivery systems: POSITIVE
Drawn by: 51610
Expiratory PAP: 5
FIO2: 60
Inspiratory PAP: 12
O2 Saturation: 97.8 %
PATIENT TEMPERATURE: 37
PCO2 ART: 50 mmHg — AB (ref 32.0–48.0)
PH ART: 7.39 (ref 7.350–7.450)
pO2, Arterial: 101 mmHg (ref 83.0–108.0)

## 2017-07-12 LAB — CBC
HEMATOCRIT: 39 % — AB (ref 40.0–52.0)
Hemoglobin: 13.3 g/dL (ref 13.0–18.0)
MCH: 33.9 pg (ref 26.0–34.0)
MCHC: 34 g/dL (ref 32.0–36.0)
MCV: 99.7 fL (ref 80.0–100.0)
PLATELETS: 103 10*3/uL — AB (ref 150–440)
RBC: 3.91 MIL/uL — ABNORMAL LOW (ref 4.40–5.90)
RDW: 13.4 % (ref 11.5–14.5)
WBC: 10.5 10*3/uL (ref 3.8–10.6)

## 2017-07-12 LAB — COMPREHENSIVE METABOLIC PANEL
ALBUMIN: 3.1 g/dL — AB (ref 3.5–5.0)
ALT: 41 U/L (ref 17–63)
ANION GAP: 6 (ref 5–15)
AST: 47 U/L — ABNORMAL HIGH (ref 15–41)
Alkaline Phosphatase: 122 U/L (ref 38–126)
BILIRUBIN TOTAL: 1.8 mg/dL — AB (ref 0.3–1.2)
BUN: 13 mg/dL (ref 6–20)
CHLORIDE: 106 mmol/L (ref 101–111)
CO2: 27 mmol/L (ref 22–32)
Calcium: 8.5 mg/dL — ABNORMAL LOW (ref 8.9–10.3)
Creatinine, Ser: 0.89 mg/dL (ref 0.61–1.24)
GFR calc Af Amer: >60 mL/min (ref >60)
GFR calc non Af Amer: >60 mL/min (ref >60)
GLUCOSE: 184 mg/dL — AB (ref 65–99)
POTASSIUM: 4.1 mmol/L (ref 3.5–5.1)
SODIUM: 139 mmol/L (ref 135–145)
TOTAL PROTEIN: 6.4 g/dL — AB (ref 6.5–8.1)

## 2017-07-12 LAB — MRSA PCR SCREENING: MRSA BY PCR: NEGATIVE

## 2017-07-12 LAB — GLUCOSE, CAPILLARY
GLUCOSE-CAPILLARY: 231 mg/dL — AB (ref 65–99)
Glucose-Capillary: 234 mg/dL — ABNORMAL HIGH (ref 65–99)
Glucose-Capillary: 195 mg/dL — ABNORMAL HIGH (ref 65–99)

## 2017-07-12 LAB — URINE DRUG SCREEN, QUALITATIVE (ARMC ONLY)
AMPHETAMINES, UR SCREEN: NOT DETECTED
BENZODIAZEPINE, UR SCRN: POSITIVE — AB
Barbiturates, Ur Screen: NOT DETECTED
COCAINE METABOLITE, UR ~~LOC~~: NOT DETECTED
Cannabinoid 50 Ng, Ur ~~LOC~~: NOT DETECTED
MDMA (Ecstasy)Ur Screen: NOT DETECTED
Methadone Scn, Ur: POSITIVE — AB
Opiate, Ur Screen: NOT DETECTED
PHENCYCLIDINE (PCP) UR S: NOT DETECTED
Tricyclic, Ur Screen: NOT DETECTED

## 2017-07-12 MED ORDER — GUAIFENESIN 100 MG/5ML PO SOLN
5.0000 mL | ORAL | Status: DC | PRN
  Filled 2017-07-12: qty 5

## 2017-07-12 MED ORDER — OXYCODONE HCL 5 MG PO TABS
5.0000 mg | ORAL_TABLET | ORAL | Status: DC | PRN
  Administered 2017-07-12 – 2017-07-13 (×4): 5 mg via ORAL
  Filled 2017-07-12 (×4): qty 1

## 2017-07-12 MED ORDER — INSULIN ASPART 100 UNIT/ML ~~LOC~~ SOLN: 0.0000 [IU] | Freq: Three times a day (TID) | SUBCUTANEOUS | Status: DC

## 2017-07-12 MED ORDER — ALPRAZOLAM 0.5 MG PO TABS
0.5000 mg | ORAL_TABLET | Freq: Once | ORAL | Status: AC
  Administered 2017-07-12: 0.5 mg via ORAL
  Filled 2017-07-12: qty 1

## 2017-07-12 MED ORDER — METHYLPREDNISOLONE SODIUM SUCC 125 MG IJ SOLR
60.0000 mg | Freq: Four times a day (QID) | INTRAMUSCULAR | Status: DC
  Administered 2017-07-12: 60 mg via INTRAVENOUS
  Filled 2017-07-12: qty 2

## 2017-07-12 MED ORDER — METHADONE HCL 10 MG PO TABS
20.0000 mg | ORAL_TABLET | Freq: Three times a day (TID) | ORAL | Status: DC
  Administered 2017-07-12 – 2017-07-15 (×9): 20 mg via ORAL
  Filled 2017-07-12 (×9): qty 2

## 2017-07-12 MED ORDER — ORAL CARE MOUTH RINSE
15.0000 mL | Freq: Two times a day (BID) | OROMUCOSAL | Status: DC
  Administered 2017-07-13 – 2017-07-14 (×3): 15 mL via OROMUCOSAL

## 2017-07-12 MED ORDER — INSULIN ASPART 100 UNIT/ML ~~LOC~~ SOLN
0.0000 [IU] | Freq: Every day | SUBCUTANEOUS | Status: DC
  Administered 2017-07-12 – 2017-07-13 (×2): 2 [IU] via SUBCUTANEOUS
  Filled 2017-07-12 (×2): qty 1

## 2017-07-12 MED ORDER — ONDANSETRON HCL 4 MG/2ML IJ SOLN: 4.0000 mg | Freq: Four times a day (QID) | INTRAMUSCULAR | Status: DC | PRN

## 2017-07-12 MED ORDER — BUDESONIDE 0.25 MG/2ML IN SUSP
0.2500 mg | Freq: Two times a day (BID) | RESPIRATORY_TRACT | Status: DC
  Administered 2017-07-12 – 2017-07-15 (×6): 0.25 mg via RESPIRATORY_TRACT
  Filled 2017-07-12 (×6): qty 2

## 2017-07-12 MED ORDER — SOFOSBUVIR-VELPATASVIR 400-100 MG PO TABS
1.0000 | ORAL_TABLET | Freq: Every day | ORAL | Status: DC
  Administered 2017-07-12 – 2017-07-14 (×3): 1 via ORAL
  Filled 2017-07-12 (×4): qty 1

## 2017-07-12 MED ORDER — ONDANSETRON HCL 4 MG PO TABS: 4.0000 mg | ORAL_TABLET | Freq: Four times a day (QID) | ORAL | Status: DC | PRN

## 2017-07-12 MED ORDER — METHADONE HCL 10 MG PO TABS: 20.0000 mg | ORAL_TABLET | Freq: Three times a day (TID) | ORAL | Status: DC

## 2017-07-12 MED ORDER — ENOXAPARIN SODIUM 40 MG/0.4ML ~~LOC~~ SOLN
40.0000 mg | SUBCUTANEOUS | Status: DC
  Administered 2017-07-12: 40 mg via SUBCUTANEOUS
  Filled 2017-07-12: qty 0.4

## 2017-07-12 MED ORDER — CHLORHEXIDINE GLUCONATE 0.12 % MT SOLN: 15.0000 mL | Freq: Two times a day (BID) | OROMUCOSAL | Status: DC

## 2017-07-12 MED ORDER — IPRATROPIUM-ALBUTEROL 0.5-2.5 (3) MG/3ML IN SOLN
3.0000 mL | Freq: Four times a day (QID) | RESPIRATORY_TRACT | Status: DC
  Administered 2017-07-12 – 2017-07-13 (×3): 3 mL via RESPIRATORY_TRACT
  Filled 2017-07-12 (×3): qty 3

## 2017-07-12 MED ORDER — METHYLPREDNISOLONE SODIUM SUCC 125 MG IJ SOLR
60.0000 mg | Freq: Three times a day (TID) | INTRAMUSCULAR | Status: DC
  Administered 2017-07-12 – 2017-07-13 (×3): 60 mg via INTRAVENOUS
  Filled 2017-07-12 (×3): qty 2

## 2017-07-12 MED ORDER — TRAZODONE HCL 50 MG PO TABS
50.0000 mg | ORAL_TABLET | Freq: Every evening | ORAL | Status: DC | PRN
  Administered 2017-07-13: 50 mg via ORAL
  Filled 2017-07-12: qty 1

## 2017-07-12 MED ORDER — ACETAMINOPHEN 650 MG RE SUPP: 650.0000 mg | Freq: Four times a day (QID) | RECTAL | Status: DC | PRN

## 2017-07-12 MED ORDER — SODIUM CHLORIDE 0.9 % IV SOLN
2.0000 g | Freq: Three times a day (TID) | INTRAVENOUS | Status: DC
  Administered 2017-07-12 – 2017-07-15 (×10): 2 g via INTRAVENOUS
  Filled 2017-07-12 (×16): qty 2

## 2017-07-12 MED ORDER — ORAL CARE MOUTH RINSE: 15.0000 mL | Freq: Two times a day (BID) | OROMUCOSAL | Status: DC

## 2017-07-12 MED ORDER — IPRATROPIUM-ALBUTEROL 0.5-2.5 (3) MG/3ML IN SOLN
3.0000 mL | RESPIRATORY_TRACT | Status: DC | PRN
  Administered 2017-07-12: 3 mL via RESPIRATORY_TRACT
  Filled 2017-07-12 (×2): qty 3

## 2017-07-12 MED ORDER — ACETAMINOPHEN 325 MG PO TABS: 650.0000 mg | ORAL_TABLET | Freq: Four times a day (QID) | ORAL | Status: DC | PRN

## 2017-07-12 MED ORDER — METHADONE HCL 10 MG PO TABS
20.0000 mg | ORAL_TABLET | Freq: Three times a day (TID) | ORAL | Status: DC
  Administered 2017-07-12: 20 mg via ORAL
  Filled 2017-07-12: qty 2

## 2017-07-12 MED ORDER — INSULIN ASPART 100 UNIT/ML ~~LOC~~ SOLN: 0.0000 [IU] | Freq: Every day | SUBCUTANEOUS | Status: DC

## 2017-07-12 MED ORDER — INSULIN ASPART 100 UNIT/ML ~~LOC~~ SOLN
0.0000 [IU] | Freq: Three times a day (TID) | SUBCUTANEOUS | Status: DC
  Administered 2017-07-12: 3 [IU] via SUBCUTANEOUS
  Administered 2017-07-12: 5 [IU] via SUBCUTANEOUS
  Administered 2017-07-13: 9 [IU] via SUBCUTANEOUS
  Administered 2017-07-13: 3 [IU] via SUBCUTANEOUS
  Administered 2017-07-13: 8 [IU] via SUBCUTANEOUS
  Administered 2017-07-14: 5 [IU] via SUBCUTANEOUS
  Administered 2017-07-14: 2 [IU] via SUBCUTANEOUS
  Administered 2017-07-14: 8 [IU] via SUBCUTANEOUS
  Administered 2017-07-15: 5 [IU] via SUBCUTANEOUS
  Filled 2017-07-12 (×9): qty 1

## 2017-07-12 MED ORDER — VANCOMYCIN HCL IN DEXTROSE 1-5 GM/200ML-% IV SOLN
1000.0000 mg | Freq: Two times a day (BID) | INTRAVENOUS | Status: DC
  Administered 2017-07-12 – 2017-07-13 (×3): 1000 mg via INTRAVENOUS
  Filled 2017-07-12 (×4): qty 200

## 2017-07-12 NOTE — Plan of Care (Signed)

## 2017-07-12 NOTE — ED Notes (Signed)
Hospitalist at bedside 

## 2017-07-12 NOTE — Progress Notes (Signed)
PHARMACIST TO [facility] has been consulted to restart patient's home Epclusa medication. 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 Pharmacy does not stock this medication and patient's nurse has been notified to tell patient to have it brought in. MD Dr. Soyla Murphy has been notified as well.   Lendon Ka, PharmD Pharmacy Resident

## 2017-07-12 NOTE — ED Notes (Signed)
RT arrived and I will escort patient to [place].

## 2017-07-12 NOTE — ED Notes (Signed)
Bagged up patient belongings, the only items out in the open is (10) 1mg  Alprazolam pills which I counted in front of the patient, and one QVAR inhaler, both put in bags with other belongings.

## 2017-07-12 NOTE — Consult Note (Signed)
   Name: [name] MRN: [id number] DOB: 05/16/1955     CONSULTATION DATE: 07/11/2017  HISTORY OF PRESENT ILLNESS:  31 y old man with history of hep C, cirrhosis, HTN, COPD, on Methadone therapy. Patient is admitted with acute respiratory failure, pneumonia, COPD exacerbation requiring titration of BiPAP. Currently in ICU off BiPAP on [place] and no distress. History was obtained form night team and PCP   PAST MEDICAL HISTORY :   has a past medical history of Anxiety, Cirrhosis ([place]), COPD (chronic obstructive pulmonary disease) ([place]), GERD (gastroesophageal reflux disease), Hepatitis C, and HTN (hypertension).  has a past surgical history that includes Tonsillectomy and Liver biopsy. Prior to Admission medications   Medication Sig Start Date End Date Taking? Authorizing Provider  albuterol (PROVENTIL HFA;VENTOLIN HFA) 108 (90 Base) MCG/ACT inhaler Inhale into the lungs every 6 (six) hours as needed for wheezing or shortness of breath.   Yes [provider]  ALPRAZolam Duanne Moron) 1 MG tablet Take 1 mg by mouth 3 (three) times daily as needed for anxiety.   Yes [provider]   No Known Allergies  FAMILY HISTORY:  family history includes Cancer in his father; Kidney disease in his father; Prostate cancer in his father. SOCIAL HISTORY:  reports that he has quit smoking. He does not have any smokeless tobacco history on file. He reports that he drank alcohol. He reports that he has current or past drug history. Drug: Cocaine.  REVIEW OF SYSTEMS:   Unable to obtain due to critical illness   VITAL SIGNS: Temp:  [94 F (34.4 C)-98.1 F (36.7 C)] 98.1 F (36.7 C) (05/04 0800) Pulse Rate:  [78-125] 99 (05/04 0800) Resp:  [12-33] 18 (05/04 0800) BP: (119-162)/(64-104) 143/104 (05/04 0800) SpO2:  [87 %-99 %] 93 % (05/04 0800) FiO2 (%):  [50 %] 50 % (05/04 0500) Weight:  [74.1 kg (163 lb 5.8 oz)-77.8 kg (171 lb 8 oz)] 74.1 kg (163 lb 5.8 oz) (05/04 0152)  Physical  Examination:  A + O and no acute neuro deficits [place], no distress, able to talk in full sentences. BEAE and no rales S1 & S2 audible no murmur Benign abdomen with normal peristalses Wasted extremities, no leg edema. Swelling Lt. Olecranon bursa and Lt. forearm  ASSESSMENT / PLAN:  Acute respiratory failure, improved and off BiPAP tolerating . -Monitor work of breathing and O2 sat.  COPD exacerbation -Systemic steroids + ABX + Bronchodilators  Pneumonia. Multifocal on CT angio -c/w Cefep , d/c Vanc. MRSA PCR -ve -Monitor CXR + CBC + FIO2.  Cirrhosis s/p Hep C -Avoid hepatotoxins and monitor LFTs.  GERD -H2 blockers  HTN -Optimize antihypertensives and monitor hemodynamics  Lt. Arm swelling as per patient after iv infiltration recently. Possible superficial thrombophlebitis. -No DVT on venous DOppler  Thrombocytopenia -Monitor Plat.  Rectosigmoid lesion/ bleeding -CT abd and Pel WO contrast  Follow with PCP for home meds and Methadone dosage  Full code  Supportive care  Critical care time 45 min

## 2017-07-12 NOTE — ED Notes (Signed)
Call for report, pt's RN just arrived to CCU, left number for call back

## 2017-07-12 NOTE — ED Notes (Signed)
Pharmacy notified this RN that pt reports "I've lost two fifty dollar bills"  Pt reports went to sleep with wallet somewhere else and woke up with wallet in lap and money is gone, pt excited about this, charge RN notified, pt has been confused since arrival

## 2017-07-12 NOTE — Progress Notes (Addendum)
Pharmacy Antibiotic Note  [name] is a 62 y.o. male admitted on 07/11/2017 with pneumonia.  Pharmacy has been consulted for vancomycin and cefepime dosing.  Plan: DW 78kg  Vd 55L kei 0.067 hr-1  T1/2 10 hours Vancomycin 1 gram q 12 hours ordered with stacked dosing. Level before 5th dose. Goal trough 15-20.  Cefepime 2 grams q 8 hours ordered.  Height: 5\' 7"  (170.2 cm) Weight: 171 lb 8 oz (77.8 kg) IBW/kg (Calculated) : 66.1  No data recorded.  Recent Labs  Lab 07/11/17 2016 07/11/17 2017  WBC 8.9  --   CREATININE 0.94  --   LATICACIDVEN  --  1.4    Estimated Creatinine Clearance: 76.2 mL/min (by C-G formula based on SCr of 0.94 mg/dL).    No Known Allergies  Antimicrobials this admission: Vancomycin, cefepime  >>    >>   Dose adjustments this admission:   Microbiology results: 5/3 BCx: pending 5/4 MRSA PCR: pending      5/3 CXR: Right middle lobe airspace disease concerning for lobar pneumonia. Thank you for allowing pharmacy to be a part of this patient's care.  Milen Lengacher S 07/12/2017 1:25 AM

## 2017-07-12 NOTE — Progress Notes (Signed)
Patient off of bipap at this time on 5l with saturations of 93%

## 2017-07-12 NOTE — Progress Notes (Signed)
[facility] [place] NAME: [name]    MR#:  [id number]  DATE OF BIRTH:  [birth year]  SUBJECTIVE:  CHIEF COMPLAINT:   Chief Complaint  Patient presents with  . Respiratory Distress   -Brought in for shortness of breath and noted to have COPD exacerbation and pneumonia.  In ICU secondary to BiPAP -On 6 L nasal cannula now  REVIEW OF SYSTEMS:  Review of Systems  Constitutional: Positive for malaise/fatigue. Negative for chills and fever.  HENT: Negative for congestion, ear discharge, hearing loss and nosebleeds.   Respiratory: Positive for shortness of breath and wheezing. Negative for cough.   Cardiovascular: Negative for chest pain, palpitations and leg swelling.  Gastrointestinal: Negative for abdominal pain, constipation, diarrhea, nausea and vomiting.  Genitourinary: Negative for dysuria.  Musculoskeletal: Positive for joint pain and myalgias.  Neurological: Negative for dizziness, focal weakness, seizures, weakness and headaches.    DRUG ALLERGIES:  No Known Allergies  VITALS:  Blood pressure (!) 143/104, pulse 99, temperature 98.1 F (36.7 C), temperature source Oral, resp. rate 18, height 5\' 10"  (1.778 m), weight 74.1 kg (163 lb 5.8 oz), SpO2 93 %.  PHYSICAL EXAMINATION:  Physical Exam  GENERAL:  62 y.o.-year-old patient lying in the bed with no acute distress.  EYES: Pupils equal, round, reactive to light and accommodation. No scleral icterus. Extraocular muscles intact.  HEENT: Head atraumatic, normocephalic. Oropharynx and nasopharynx clear.  NECK:  Supple, no jugular venous distention. No thyroid enlargement, no tenderness.  LUNGS: Very tight on auscultation, scant breath sounds, no audible wheezing, no rales,rhonchi or crepitation. No use of accessory muscles of respiration.  CARDIOVASCULAR: S1, S2 normal. No murmurs, rubs, or gallops.  ABDOMEN: Soft, nontender, nondistended. Bowel sounds present. No  organomegaly or mass.  EXTREMITIES: No pedal edema, cyanosis, or clubbing.  Left upper extremity forearm swelling noted, also significant olecranon bursa swelling noted. NEUROLOGIC: Cranial nerves II through XII are intact. Muscle strength 5/5 in all extremities. Sensation intact. Gait not checked.  PSYCHIATRIC: The patient is alert and oriented x 3.  SKIN: No obvious rash, lesion, or ulcer.    LABORATORY PANEL:   CBC Recent Labs  Lab 07/12/17 0437  WBC 10.5  HGB 13.3  HCT 39.0*  PLT 103*   ------------------------------------------------------------------------------------------------------------------  Chemistries  Recent Labs  Lab 07/12/17 0437  NA 139  K 4.1  CL 106  CO2 27  GLUCOSE 184*  BUN 13  CREATININE 0.89  CALCIUM 8.5*  AST 47*  ALT 41  ALKPHOS 122  BILITOT 1.8*   ------------------------------------------------------------------------------------------------------------------  Cardiac Enzymes Recent Labs  Lab 07/11/17 2016  TROPONINI 0.03*   ------------------------------------------------------------------------------------------------------------------  RADIOLOGY:  US Venous Img Upper Uni Left  Result Date: 07/11/2017 CLINICAL DATA:  62 year old male with left upper extremity edema and pain. EXAM: Left UPPER EXTREMITY VENOUS DOPPLER ULTRASOUND TECHNIQUE: Gray-scale sonography with graded compression, as well as color Doppler and duplex ultrasound were performed to evaluate the upper extremity deep venous system from the level of the subclavian vein and including the jugular, axillary, basilic, radial, ulnar and upper cephalic vein. Spectral Doppler was utilized to evaluate flow at rest and with distal augmentation maneuvers. COMPARISON:  None. FINDINGS: Patient could not tolerate the study. The study had to the terminated and resumed an hour later. Contralateral Subclavian Vein: Respiratory phasicity is normal and symmetric with the symptomatic side. No  evidence of thrombus. Normal compressibility. Internal Jugular Vein: No evidence of thrombus. Normal compressibility, respiratory phasicity and  response to augmentation. Subclavian Vein: No evidence of thrombus. Normal compressibility, respiratory phasicity and response to augmentation. Axillary Vein: No evidence of thrombus. Normal compressibility, respiratory phasicity and response to augmentation. Cephalic Vein: No evidence of thrombus. Normal compressibility, respiratory phasicity and response to augmentation. Basilic Vein: No evidence of thrombus. Normal compressibility, respiratory phasicity and response to augmentation. Brachial Veins: No evidence of thrombus. Normal compressibility, respiratory phasicity and response to augmentation. Radial Veins: No evidence of thrombus. Normal compressibility, respiratory phasicity and response to augmentation. Ulnar Veins: No evidence of thrombus. Normal compressibility, respiratory phasicity and response to augmentation. Venous Reflux:  None visualized. Other Findings:  None visualized. IMPRESSION: No evidence of DVT within the left upper extremity. Electronically Signed   By: Anner Crete M.D.   On: 07/11/2017 23:27   Dg Chest Port 1 View  Result Date: 07/11/2017 CLINICAL DATA:  Dyspnea.  Hypoxia.  Bronchospasm. EXAM: PORTABLE CHEST 1 VIEW COMPARISON:  None. FINDINGS: Heart size is normal. Changes COPD are noted. Right middle lobe airspace disease is present. No definite effusions are present. Atherosclerotic changes are noted at the aortic arch. IMPRESSION: 1. Right middle lobe airspace disease concerning for lobar pneumonia. 2. Changes of COPD. 3. Aortic atherosclerosis. 4. No evidence for congestive heart failure. Electronically Signed   By: San Morelle M.D.   On: 07/11/2017 20:29   Ct Angio Chest/abd/pel For Dissection W And/or Wo Contrast  Result Date: 07/11/2017 CLINICAL DATA:  Cyanotic with bronchospasm EXAM: CT ANGIOGRAPHY CHEST, ABDOMEN AND  PELVIS TECHNIQUE: Multidetector CT imaging through the chest, abdomen and pelvis was performed using the standard protocol during bolus administration of intravenous contrast. Multiplanar reconstructed images and MIPs were obtained and reviewed to evaluate the vascular anatomy. CONTRAST:  197mL ISOVUE-370 IOPAMIDOL (ISOVUE-370) INJECTION 76% COMPARISON:  Chest x-ray 07/11/2017, 07/02/2017, CT 09/09/2016, MRI 08/04/2015 FINDINGS: CTA CHEST FINDINGS Cardiovascular: Non contrasted images of the chest demonstrate no intramural hematoma. Moderate aortic atherosclerosis. Coronary vascular calcification. Normal heart size. No pericardial effusion. No dissection is seen.  No aneurysm. Mediastinum/Nodes: Midline trachea. No thyroid mass. Prominent subcarinal lymph node measuring 13 mm. No significantly enlarged hilar nodes. Esophagus within normal limits. Lungs/Pleura: Severe emphysema. Multifocal consolidations and ground-glass densities within the right lower lobe, right middle lobe and bilateral upper lobes. Small posterior calcified pleural plaques. No significant effusion. No pneumothorax. Musculoskeletal: Chronic compression deformities at T6, T7, T8 and T11. No suspicious bone lesion. Review of the MIP images confirms the above findings. CTA ABDOMEN AND PELVIS FINDINGS VASCULAR Aorta: No aneurysm or dissection. Moderate severe aortic atherosclerosis. Celiac: Moderate stenosis at the origin.  Distal vascular patency. SMA: No significant stenosis.  Distal vascular patency. Renals: Single right and single left renal arteries. No significant stenosis on the left. Mild stenosis at the origin of the right renal artery. IMA: Calcified origin.  Distal vascular patency. Inflow: Moderate severe diffuse atherosclerotic vascular disease of the aortoiliac bifurcation. Mild disease of the left external iliac and common femoral arteries without occlusion or high-grade stenosis. Moderate diffuse disease of the right external iliac  without occlusion. Perisplenic varices. Review of the MIP images confirms the above findings. NON-VASCULAR Hepatobiliary: Cirrhotic morphology of the liver. Multiple calcified gallstones. No biliary dilatation Pancreas: Unremarkable. No pancreatic ductal dilatation or surrounding inflammatory changes. Spleen: Normal in size without focal abnormality. Adrenals/Urinary Tract: Adrenal glands are within normal limits. No hydronephrosis. Bladder normal. Stomach/Bowel: Nonenlarged stomach. No dilated small bowel. Hyperdense foci within the rectosigmoid colon. No colon wall thickening. Lymphatic: No significantly enlarged lymph nodes.  Reproductive: Prostate is unremarkable. Other: Negative for free air or free fluid. Musculoskeletal: Moderate severe degenerative changes L3 through S1. Review of the MIP images confirms the above findings. IMPRESSION: 1. Negative for aortic aneurysm or dissection. 2. Severe emphysema. Multifocal ground-glass densities and consolidations, most suspicious for multifocal pneumonia. 3. Small hyperdense foci within the rectosigmoid colon, favor radiopaque ingested material over small foci of hemorrhage but cannot be definitive in the absence of non contrasted images through this region. 4. Cirrhosis of the liver with evidence of portal hypertension 5. Gallstones Electronically Signed   By: Donavan Foil M.D.   On: 07/11/2017 22:54    EKG:   Orders placed or performed during the hospital encounter of 07/11/17  . ED EKG  . ED EKG  . EKG 12-Lead  . EKG 12-Lead    ASSESSMENT AND PLAN:   62 year old male with past medical history significant for COPD not on home oxygen, hepatitis C with cirrhosis, GERD, hypertension brought in secondary to acute hypoxic respiratory failure  1.  Acute hypoxic and hypercarbic respiratory failure-secondary to COPD exacerbation and also pneumonia-right middle lobe infiltrate -MRSA PCR is negative.  Can discontinue vancomycin -On cefepime. -Continue  steroids and nebulizer treatments -On BiPAP-being transitioned to nasal cannula -Appreciate pulmonary critical care consult  2.  Hepatitis C and liver cirrhosis-follows up with GI.  On [place] for treatment-we will continue that  3.  Chronic pain-has been on methadone, states his psychiatrist dispenses it.  Noted in his previous discharge summary as well.  Patient was able to verify the dose -Continue methadone  4.  DVT prophylaxis-Lovenox  Once respiratory status is stable, encourage out of bed and ambulation     All the records are reviewed and case discussed with Care Management/Social Workerr. Management plans discussed with the patient, family and they are in agreement.  CODE STATUS: Full Code  TOTAL TIME TAKING CARE OF THIS PATIENT: 38 minutes.   POSSIBLE D/C IN 2 DAYS, DEPENDING ON CLINICAL CONDITION.   Gladstone Lighter M.D on 07/12/2017 at 9:04 AM  Between 7am to 6pm - Pager - [phone]  After 6pm go to [email or web address] - password [email or web address] [facility]  Office  [phone]  CC: Primary care physician; Theotis Burrow, MD

## 2017-07-13 ENCOUNTER — Inpatient Hospital Stay: Payer: Medicaid Other

## 2017-07-13 LAB — CBC WITH DIFFERENTIAL/PLATELET
Basophils Absolute: 0 10*3/uL (ref 0–0.1)
Basophils Relative: 0 %
Eosinophils Absolute: 0 10*3/uL (ref 0–0.7)
Eosinophils Relative: 0 %
HEMATOCRIT: 38 % — AB (ref 40.0–52.0)
Hemoglobin: 12.9 g/dL — ABNORMAL LOW (ref 13.0–18.0)
LYMPHS ABS: 0.5 10*3/uL — AB (ref 1.0–3.6)
LYMPHS PCT: 6 %
MCH: 33.9 pg (ref 26.0–34.0)
MCHC: 33.9 g/dL (ref 32.0–36.0)
MCV: 100.1 fL — AB (ref 80.0–100.0)
MONO ABS: 0.3 10*3/uL (ref 0.2–1.0)
MONOS PCT: 4 %
NEUTROS ABS: 8.4 10*3/uL — AB (ref 1.4–6.5)
Neutrophils Relative %: 90 %
Platelets: 88 10*3/uL — ABNORMAL LOW (ref 150–440)
RBC: 3.8 MIL/uL — ABNORMAL LOW (ref 4.40–5.90)
RDW: 12.9 % (ref 11.5–14.5)
WBC: 9.2 10*3/uL (ref 3.8–10.6)

## 2017-07-13 LAB — BASIC METABOLIC PANEL
Anion gap: 3 — ABNORMAL LOW (ref 5–15)
BUN: 17 mg/dL (ref 6–20)
CHLORIDE: 106 mmol/L (ref 101–111)
CO2: 27 mmol/L (ref 22–32)
Calcium: 8.1 mg/dL — ABNORMAL LOW (ref 8.9–10.3)
Creatinine, Ser: 0.71 mg/dL (ref 0.61–1.24)
GFR calc Af Amer: >60 mL/min (ref >60)
GFR calc non Af Amer: >60 mL/min (ref >60)
Glucose, Bld: 285 mg/dL — ABNORMAL HIGH (ref 65–99)
POTASSIUM: 4.4 mmol/L (ref 3.5–5.1)
SODIUM: 136 mmol/L (ref 135–145)

## 2017-07-13 LAB — GLUCOSE, CAPILLARY
GLUCOSE-CAPILLARY: 274 mg/dL — AB (ref 65–99)
Glucose-Capillary: 254 mg/dL — ABNORMAL HIGH (ref 65–99)
Glucose-Capillary: 189 mg/dL — ABNORMAL HIGH (ref 65–99)
Glucose-Capillary: 233 mg/dL — ABNORMAL HIGH (ref 65–99)

## 2017-07-13 LAB — HIV ANTIBODY (ROUTINE TESTING W REFLEX): HIV SCREEN 4TH GENERATION: NONREACTIVE

## 2017-07-13 LAB — AMMONIA: Ammonia: 54 umol/L — ABNORMAL HIGH (ref 9–35)

## 2017-07-13 MED ORDER — ALPRAZOLAM 1 MG PO TABS
1.0000 mg | ORAL_TABLET | Freq: Three times a day (TID) | ORAL | Status: DC | PRN
  Administered 2017-07-13 – 2017-07-15 (×4): 1 mg via ORAL
  Filled 2017-07-13 (×4): qty 1

## 2017-07-13 MED ORDER — IPRATROPIUM-ALBUTEROL 0.5-2.5 (3) MG/3ML IN SOLN
3.0000 mL | Freq: Three times a day (TID) | RESPIRATORY_TRACT | Status: DC
  Administered 2017-07-13 – 2017-07-15 (×5): 3 mL via RESPIRATORY_TRACT
  Filled 2017-07-13 (×5): qty 3

## 2017-07-13 MED ORDER — LACTULOSE 10 GM/15ML PO SOLN
20.0000 g | Freq: Every day | ORAL | Status: DC
  Administered 2017-07-13 – 2017-07-14 (×2): 20 g via ORAL
  Filled 2017-07-13 (×2): qty 30

## 2017-07-13 MED ORDER — METHYLPREDNISOLONE SODIUM SUCC 40 MG IJ SOLR
40.0000 mg | Freq: Three times a day (TID) | INTRAMUSCULAR | Status: DC
  Administered 2017-07-13 – 2017-07-15 (×6): 40 mg via INTRAVENOUS
  Filled 2017-07-13 (×7): qty 1

## 2017-07-13 NOTE — Progress Notes (Signed)
   Name: [name] MRN: [id number] DOB: 23-Feb-1956     CONSULTATION DATE: 07/11/2017 Subjective & Objective: On BiPAP, no major issues last night  PAST MEDICAL HISTORY :   has a past medical history of Anxiety, Cirrhosis ([place]), COPD (chronic obstructive pulmonary disease) ([place]), GERD (gastroesophageal reflux disease), Hepatitis C, and HTN (hypertension).  has a past surgical history that includes Tonsillectomy and Liver biopsy. Prior to Admission medications   Medication Sig Start Date End Date Taking? Authorizing Provider  albuterol (PROVENTIL HFA;VENTOLIN HFA) 108 (90 Base) MCG/ACT inhaler Inhale into the lungs every 6 (six) hours as needed for wheezing or shortness of breath.   Yes [provider]  alfuzosin (UROXATRAL) 10 MG 24 hr tablet Take 10 mg by mouth 2 (two) times daily.    Yes [provider]  ALPRAZolam Duanne Moron) 1 MG tablet Take 1 mg by mouth 3 (three) times daily as needed for anxiety.   Yes [provider]  budesonide-formoterol (SYMBICORT) 160-4.5 MCG/ACT inhaler Inhale 2 puffs into the lungs 2 (two) times daily.   Yes [provider]  methadone (DOLOPHINE) 10 MG tablet Take 25 mg by mouth every 8 (eight) hours.   Yes [provider]  Sofosbuvir-Velpatasvir (EPCLUSA) 400-100 MG TABS Take 1 tablet by mouth daily.   Yes [provider]  tiotropium (SPIRIVA) 18 MCG inhalation capsule Place 18 mcg into inhaler and inhale daily.   Yes [provider]   No Known Allergies  FAMILY HISTORY:  family history includes Cancer in his father; Kidney disease in his father; Prostate cancer in his father. SOCIAL HISTORY:  reports that he has quit smoking. He does not have any smokeless tobacco history on file. He reports that he drank alcohol. He reports that he has current or past drug history. Drug: Cocaine.  REVIEW OF SYSTEMS:   Unable to obtain due to critical illness   VITAL SIGNS: Temp:  [96.4 F (35.8 C)-98.4 F  (36.9 C)] 98.3 F (36.8 C) (05/05 0740) Pulse Rate:  [69-99] 90 (05/05 0900) Resp:  [10-21] 10 (05/05 0900) BP: (105-159)/(48-114) 105/61 (05/05 0900) SpO2:  [89 %-95 %] 93 % (05/05 0900) Weight:  [70.6 kg (155 lb 10.3 oz)] 70.6 kg (155 lb 10.3 oz) (05/05 0418)  Physical Examination:  A + O and no acute neuro deficits On [place], no distress, able to talk in full sentences. BEAE and no rales S1 & S2 audible no murmur Benign abdomen with normal peristalses Wasted extremities, no leg edema. Swelling Lt. Olecranon bursa and Lt. forearm   ASSESSMENT / PLAN: Acute respiratory failure, improved and off BiPAP tolerating [place]. -Monitor work of breathing and O2 sat.  COPD exacerbation -Taper systemic steroids + ABX + Bronchodilators  Pneumonia. Multifocal on CT angio. Improved Bil airspace disease. -c/w Cefep , d/c Vanc. MRSA PCR -ve -Monitor CXR + CBC + FIO2.  Cirrhosis s/p Hep C and hyperammonemia -Avoid hepatotoxins and monitor LFTs. -Start on Lactulose and monitor Ammonia level.  GERD -H2 blockers  HTN -Optimize antihypertensives and monitor hemodynamics  Lt. Arm swelling as per patient after iv infiltration recently. Possible superficial thrombophlebitis. -No DVT on venous Doppler  Lt. Olecranon bursitis -Analgesia and follow with CT.  Thrombocytopenia (worsening) -Monitor Plat.  Rectosigmoid lesion/ bleeding -CT abd and Pel WO contrast  Follow with PCP for home meds and Methadone dosage  Full code  Supportive care  Critical care time 35 min

## 2017-07-13 NOTE — Progress Notes (Signed)
[facility] [place] NAME: Pinchas Reither    MR#:  [id number]  DATE OF BIRTH:  10-Mar-1956  SUBJECTIVE:  CHIEF COMPLAINT:   Chief Complaint  Patient presents with  . Respiratory Distress   -On and off BiPAP yesterday.  Doing much better this morning.  On 3 L via nasal cannula  REVIEW OF SYSTEMS:  Review of Systems  Constitutional: Positive for malaise/fatigue. Negative for chills and fever.  HENT: Negative for congestion, ear discharge, hearing loss and nosebleeds.   Respiratory: Positive for shortness of breath. Negative for cough and wheezing.   Cardiovascular: Negative for chest pain, palpitations and leg swelling.  Gastrointestinal: Negative for abdominal pain, constipation, diarrhea, nausea and vomiting.  Genitourinary: Negative for dysuria.  Musculoskeletal: Positive for joint pain and myalgias.  Neurological: Negative for dizziness, focal weakness, seizures, weakness and headaches.    DRUG ALLERGIES:  No Known Allergies  VITALS:  Blood pressure 128/81, pulse 80, temperature 98.1 F (36.7 C), temperature source Oral, resp. rate (!) 9, height 5\' 10"  (1.778 m), weight 70.6 kg (155 lb 10.3 oz), SpO2 93 %.  PHYSICAL EXAMINATION:  Physical Exam  GENERAL:  62 y.o.-year-old patient lying in the bed with no acute distress.  EYES: Pupils equal, round, reactive to light and accommodation. No scleral icterus. Extraocular muscles intact.  HEENT: Head atraumatic, normocephalic. Oropharynx and nasopharynx clear.  NECK:  Supple, no jugular venous distention. No thyroid enlargement, no tenderness.  LUNGS: Very tight on auscultation, scant breath sounds, no audible wheezing, no rales,rhonchi or crepitation. No use of accessory muscles of respiration.  CARDIOVASCULAR: S1, S2 normal. No murmurs, rubs, or gallops.  ABDOMEN: Soft, nontender, nondistended. Bowel sounds present. No organomegaly or mass.  EXTREMITIES: No pedal edema, cyanosis, or  clubbing.  Left upper extremity forearm swelling noted, also significant olecranon bursa swelling noted. NEUROLOGIC: Cranial nerves II through XII are intact. Muscle strength 5/5 in all extremities. Sensation intact. Gait not checked.  PSYCHIATRIC: The patient is alert and oriented x 3.  SKIN: No obvious rash, lesion, or ulcer.    LABORATORY PANEL:   CBC Recent Labs  Lab 07/13/17 0435  WBC 9.2  HGB 12.9*  HCT 38.0*  PLT 88*   ------------------------------------------------------------------------------------------------------------------  Chemistries  Recent Labs  Lab 07/12/17 0437 07/13/17 0435  NA 139 136  K 4.1 4.4  CL 106 106  CO2 27 27  GLUCOSE 184* 285*  BUN 13 17  CREATININE 0.89 0.71  CALCIUM 8.5* 8.1*  AST 47*  --   ALT 41  --   ALKPHOS 122  --   BILITOT 1.8*  --    ------------------------------------------------------------------------------------------------------------------  Cardiac Enzymes Recent Labs  Lab 07/11/17 2016  TROPONINI 0.03*   ------------------------------------------------------------------------------------------------------------------  RADIOLOGY:  Ct Abdomen Pelvis Wo Contrast  Result Date: 07/12/2017 CLINICAL DATA:  Hyperdense foci within the rectosigmoid colon on CTA yesterday. EXAM: CT ABDOMEN AND PELVIS WITHOUT CONTRAST TECHNIQUE: Multidetector CT imaging of the abdomen and pelvis was performed following the standard protocol without IV contrast. COMPARISON:  CTA chest, abdomen, and pelvis from yesterday. FINDINGS: Lower chest: Emphysema. Unchanged ground-glass densities within the right middle and lower lobes. Hepatobiliary: Cirrhosis. No focal liver abnormality. Small gallstones again noted. No gallbladder wall thickening or biliary dilatation. Pancreas: Mild atrophy. No ductal dilatation or surrounding inflammatory changes. Spleen: Normal in size without focal abnormality. Adrenals/Urinary Tract: The adrenal glands are  unremarkable. No focal renal lesion. Small amount of residual contrast within the bilateral renal collecting  systems. No hydronephrosis. Contrast within the mildly distended bladder. Stomach/Bowel: The stomach is within normal limits. No bowel obstruction. Scattered small hyperdense foci and stool throughout the rectosigmoid colon, consistent with radiopaque ingested material. No bowel wall thickening or surrounding inflammatory changes. Normal appendix. Vascular/Lymphatic: Extensive aortic atherosclerosis. Perisplenic varices again noted. No lymphadenopathy. Reproductive: Prostate is unremarkable. Other: No free fluid or pneumoperitoneum. Musculoskeletal: No acute or significant osseous findings. Stable degenerative changes of the lower lumbar spine. IMPRESSION: 1. Scattered small hyperdense foci and stool throughout the rectosigmoid colon on this noncontrast study, most consistent with radiopaque ingested material. 2. Multifocal pneumonia at the right lung base, unchanged. 3. Cirrhosis with sequelae of portal hypertension. 4. Cholelithiasis. 5.  Aortic atherosclerosis (ICD10-I70.0). 6.  Emphysema (ICD10-J43.9). Electronically Signed   By: Titus Dubin M.D.   On: 07/12/2017 10:31   US Venous Img Upper Uni Left  Result Date: 07/11/2017 CLINICAL DATA:  62 year old male with left upper extremity edema and pain. EXAM: Left UPPER EXTREMITY VENOUS DOPPLER ULTRASOUND TECHNIQUE: Gray-scale sonography with graded compression, as well as color Doppler and duplex ultrasound were performed to evaluate the upper extremity deep venous system from the level of the subclavian vein and including the jugular, axillary, basilic, radial, ulnar and upper cephalic vein. Spectral Doppler was utilized to evaluate flow at rest and with distal augmentation maneuvers. COMPARISON:  None. FINDINGS: Patient could not tolerate the study. The study had to the terminated and resumed an hour later. Contralateral Subclavian Vein: Respiratory  phasicity is normal and symmetric with the symptomatic side. No evidence of thrombus. Normal compressibility. Internal Jugular Vein: No evidence of thrombus. Normal compressibility, respiratory phasicity and response to augmentation. Subclavian Vein: No evidence of thrombus. Normal compressibility, respiratory phasicity and response to augmentation. Axillary Vein: No evidence of thrombus. Normal compressibility, respiratory phasicity and response to augmentation. Cephalic Vein: No evidence of thrombus. Normal compressibility, respiratory phasicity and response to augmentation. Basilic Vein: No evidence of thrombus. Normal compressibility, respiratory phasicity and response to augmentation. Brachial Veins: No evidence of thrombus. Normal compressibility, respiratory phasicity and response to augmentation. Radial Veins: No evidence of thrombus. Normal compressibility, respiratory phasicity and response to augmentation. Ulnar Veins: No evidence of thrombus. Normal compressibility, respiratory phasicity and response to augmentation. Venous Reflux:  None visualized. Other Findings:  None visualized. IMPRESSION: No evidence of DVT within the left upper extremity. Electronically Signed   By: Anner Crete M.D.   On: 07/11/2017 23:27   Dg Chest Port 1 View  Result Date: 07/13/2017 CLINICAL DATA:  62 year old male with history of pneumonia. EXAM: PORTABLE CHEST 1 VIEW COMPARISON:  Chest x-ray 07/11/2017.  Chest CT 07/11/2017. FINDINGS: The patchy interstitial prominence and airspace disease seen on the prior study has significantly improved, but has yet to completely resolve. The most confluent area of residual airspace consolidation is now in the periphery of the lower left lung, likely in the lingula. No pleural effusions. No evidence of pulmonary edema. Emphysematous changes are noted throughout the lungs. Heart size is normal. Upper mediastinal contours are within normal limits. Aortic atherosclerosis. IMPRESSION:  1. Improving multilobar pneumonia, as above. 2. Aortic atherosclerosis. 3. Emphysema. Electronically Signed   By: Vinnie Langton M.D.   On: 07/13/2017 07:20   Dg Chest Port 1 View  Result Date: 07/11/2017 CLINICAL DATA:  Dyspnea.  Hypoxia.  Bronchospasm. EXAM: PORTABLE CHEST 1 VIEW COMPARISON:  None. FINDINGS: Heart size is normal. Changes COPD are noted. Right middle lobe airspace disease is present. No definite effusions are present. Atherosclerotic  changes are noted at the aortic arch. IMPRESSION: 1. Right middle lobe airspace disease concerning for lobar pneumonia. 2. Changes of COPD. 3. Aortic atherosclerosis. 4. No evidence for congestive heart failure. Electronically Signed   By: San Morelle M.D.   On: 07/11/2017 20:29   Ct Angio Chest/abd/pel For Dissection W And/or Wo Contrast  Result Date: 07/11/2017 CLINICAL DATA:  Cyanotic with bronchospasm EXAM: CT ANGIOGRAPHY CHEST, ABDOMEN AND PELVIS TECHNIQUE: Multidetector CT imaging through the chest, abdomen and pelvis was performed using the standard protocol during bolus administration of intravenous contrast. Multiplanar reconstructed images and MIPs were obtained and reviewed to evaluate the vascular anatomy. CONTRAST:  159mL ISOVUE-370 IOPAMIDOL (ISOVUE-370) INJECTION 76% COMPARISON:  Chest x-ray 07/11/2017, 07/02/2017, CT 09/09/2016, MRI 08/04/2015 FINDINGS: CTA CHEST FINDINGS Cardiovascular: Non contrasted images of the chest demonstrate no intramural hematoma. Moderate aortic atherosclerosis. Coronary vascular calcification. Normal heart size. No pericardial effusion. No dissection is seen.  No aneurysm. Mediastinum/Nodes: Midline trachea. No thyroid mass. Prominent subcarinal lymph node measuring 13 mm. No significantly enlarged hilar nodes. Esophagus within normal limits. Lungs/Pleura: Severe emphysema. Multifocal consolidations and ground-glass densities within the right lower lobe, right middle lobe and bilateral upper lobes. Small  posterior calcified pleural plaques. No significant effusion. No pneumothorax. Musculoskeletal: Chronic compression deformities at T6, T7, T8 and T11. No suspicious bone lesion. Review of the MIP images confirms the above findings. CTA ABDOMEN AND PELVIS FINDINGS VASCULAR Aorta: No aneurysm or dissection. Moderate severe aortic atherosclerosis. Celiac: Moderate stenosis at the origin.  Distal vascular patency. SMA: No significant stenosis.  Distal vascular patency. Renals: Single right and single left renal arteries. No significant stenosis on the left. Mild stenosis at the origin of the right renal artery. IMA: Calcified origin.  Distal vascular patency. Inflow: Moderate severe diffuse atherosclerotic vascular disease of the aortoiliac bifurcation. Mild disease of the left external iliac and common femoral arteries without occlusion or high-grade stenosis. Moderate diffuse disease of the right external iliac without occlusion. Perisplenic varices. Review of the MIP images confirms the above findings. NON-VASCULAR Hepatobiliary: Cirrhotic morphology of the liver. Multiple calcified gallstones. No biliary dilatation Pancreas: Unremarkable. No pancreatic ductal dilatation or surrounding inflammatory changes. Spleen: Normal in size without focal abnormality. Adrenals/Urinary Tract: Adrenal glands are within normal limits. No hydronephrosis. Bladder normal. Stomach/Bowel: Nonenlarged stomach. No dilated small bowel. Hyperdense foci within the rectosigmoid colon. No colon wall thickening. Lymphatic: No significantly enlarged lymph nodes. Reproductive: Prostate is unremarkable. Other: Negative for free air or free fluid. Musculoskeletal: Moderate severe degenerative changes L3 through S1. Review of the MIP images confirms the above findings. IMPRESSION: 1. Negative for aortic aneurysm or dissection. 2. Severe emphysema. Multifocal ground-glass densities and consolidations, most suspicious for multifocal pneumonia. 3.  Small hyperdense foci within the rectosigmoid colon, favor radiopaque ingested material over small foci of hemorrhage but cannot be definitive in the absence of non contrasted images through this region. 4. Cirrhosis of the liver with evidence of portal hypertension 5. Gallstones Electronically Signed   By: Donavan Foil M.D.   On: 07/11/2017 22:54    EKG:   Orders placed or performed during the hospital encounter of 07/11/17  . ED EKG  . ED EKG  . EKG 12-Lead  . EKG 12-Lead    ASSESSMENT AND PLAN:   62 year old male with past medical history significant for COPD not on home oxygen, hepatitis C with cirrhosis, GERD, hypertension brought in secondary to acute hypoxic respiratory failure  1.  Acute hypoxic and hypercarbic respiratory failure-secondary to  COPD exacerbation and also pneumonia-right middle lobe infiltrate -MRSA PCR is negative.  discontinued vancomycin -On cefepime. -on steroids and nebulizer treatments -On  nasal cannula- not on home o2 -Appreciate pulmonary critical care consult  2.  Hepatitis C and liver cirrhosis-follows up with GI.  On Harvoni for treatment-we will continue that  3.  Chronic pain-has been on methadone, states his psychiatrist dispenses it.  Noted in his previous discharge summary as well.  Patient was able to verify the dose -Continue methadone  4.  DVT prophylaxis-Lovenox  5.  Left olecranial bursitis-forearm swelling is much improved, ultrasound negative for any DVT. -CT of the left elbow ordered  Since respiratory status is stable, encourage out of bed and ambulation    All the records are reviewed and case discussed with Care Management/Social Workerr. Management plans discussed with the patient, family and they are in agreement.  CODE STATUS: Full Code  TOTAL TIME TAKING CARE OF THIS PATIENT: 36 minutes.   POSSIBLE D/C TOMORROW, DEPENDING ON CLINICAL CONDITION.   Jarrius Huaracha M.D on 07/13/2017 at 12:52 PM  Between 7am to 6pm -  Pager - [phone]  After 6pm go to [email or web address] - password [email or web address] [facility]  Office  [phone]  CC: Primary care physician; Theotis Burrow, MD

## 2017-07-13 NOTE — Progress Notes (Signed)
Pt has expectorated a small amount of red/rust-colored sputum. Dr. Soyla Murphy notified. Will collect sputum culture and order SCDs for DVT prophylaxis per his verbal order. Pt transferring to unit 2A today. Pt is aware and agreeable to transfer. Assessment is unchanged from this morning and receiving RN has been given report. Belongings transferred with pt at bedside.

## 2017-07-14 LAB — CBC WITH DIFFERENTIAL/PLATELET
BASOS ABS: 0 10*3/uL (ref 0–0.1)
Basophils Relative: 0 %
EOS ABS: 0 10*3/uL (ref 0–0.7)
EOS PCT: 0 %
HCT: 38.5 % — ABNORMAL LOW (ref 40.0–52.0)
Hemoglobin: 13 g/dL (ref 13.0–18.0)
Lymphocytes Relative: 5 %
Lymphs Abs: 0.6 10*3/uL — ABNORMAL LOW (ref 1.0–3.6)
MCH: 34 pg (ref 26.0–34.0)
MCHC: 33.8 g/dL (ref 32.0–36.0)
MCV: 100.4 fL — ABNORMAL HIGH (ref 80.0–100.0)
MONO ABS: 0.6 10*3/uL (ref 0.2–1.0)
Monocytes Relative: 4 %
Neutro Abs: 11.7 10*3/uL — ABNORMAL HIGH (ref 1.4–6.5)
Neutrophils Relative %: 91 %
PLATELETS: 93 10*3/uL — AB (ref 150–440)
RBC: 3.83 MIL/uL — AB (ref 4.40–5.90)
RDW: 13.2 % (ref 11.5–14.5)
WBC: 12.9 10*3/uL — AB (ref 3.8–10.6)

## 2017-07-14 LAB — COMPREHENSIVE METABOLIC PANEL
ALK PHOS: 92 U/L (ref 38–126)
ALT: 35 U/L (ref 17–63)
AST: 36 U/L (ref 15–41)
Albumin: 2.7 g/dL — ABNORMAL LOW (ref 3.5–5.0)
Anion gap: 6 (ref 5–15)
BUN: 20 mg/dL (ref 6–20)
CALCIUM: 8.2 mg/dL — AB (ref 8.9–10.3)
CO2: 27 mmol/L (ref 22–32)
CREATININE: 0.68 mg/dL (ref 0.61–1.24)
Chloride: 108 mmol/L (ref 101–111)
GFR calc Af Amer: >60 mL/min (ref >60)
Glucose, Bld: 211 mg/dL — ABNORMAL HIGH (ref 65–99)
Potassium: 4.1 mmol/L (ref 3.5–5.1)
Sodium: 141 mmol/L (ref 135–145)
TOTAL PROTEIN: 6 g/dL — AB (ref 6.5–8.1)
Total Bilirubin: 1.1 mg/dL (ref 0.3–1.2)

## 2017-07-14 LAB — GLUCOSE, CAPILLARY
GLUCOSE-CAPILLARY: 206 mg/dL — AB (ref 65–99)
GLUCOSE-CAPILLARY: 145 mg/dL — AB (ref 65–99)
GLUCOSE-CAPILLARY: 129 mg/dL — AB (ref 65–99)
Glucose-Capillary: 277 mg/dL — ABNORMAL HIGH (ref 65–99)

## 2017-07-14 LAB — EXPECTORATED SPUTUM ASSESSMENT W REFEX TO RESP CULTURE

## 2017-07-14 LAB — EXPECTORATED SPUTUM ASSESSMENT W GRAM STAIN, RFLX TO RESP C

## 2017-07-14 LAB — AMMONIA: Ammonia: 60 umol/L — ABNORMAL HIGH (ref 9–35)

## 2017-07-14 MED ORDER — SODIUM CHLORIDE 0.9% FLUSH
3.0000 mL | Freq: Two times a day (BID) | INTRAVENOUS | Status: DC
  Administered 2017-07-14: 3 mL via INTRAVENOUS

## 2017-07-14 MED ORDER — LACTULOSE 10 GM/15ML PO SOLN
20.0000 g | Freq: Two times a day (BID) | ORAL | Status: DC
  Administered 2017-07-14 – 2017-07-15 (×2): 20 g via ORAL
  Filled 2017-07-14 (×2): qty 30

## 2017-07-14 NOTE — Progress Notes (Signed)
Pharmacy Antibiotic Note  [name] is a 62 y.o. male admitted on 07/11/2017 with pneumonia.  Pharmacy has been consulted for vancomycin and cefepime dosing. Vancomcyin d/c'd 5/5.  Plan: Continue Cefepime 2 grams IV q 8 hours ordered.  Height: 5\' 10"  (177.8 cm) Weight: 153 lb 9.6 oz (69.7 kg) IBW/kg (Calculated) : 73  Temp (24hrs), Avg:98.1 F (36.7 C), Min:97.8 F (36.6 C), Max:98.3 F (36.8 C)  Recent Labs  Lab 07/11/17 2016 07/11/17 2017 07/12/17 0437 07/13/17 0435 07/14/17 0542  WBC 8.9  --  10.5 9.2 12.9*  CREATININE 0.94  --  0.89 0.71 0.68  LATICACIDVEN  --  1.4  --   --   --     Estimated Creatinine Clearance: 94.4 mL/min (by C-G formula based on SCr of 0.68 mg/dL).    No Known Allergies  Antimicrobials this admission: Vancomycin 5/4>>5/5 Zosyn 5/4 x1 cefepime  5/4>>   Dose adjustments this admission:   Microbiology results: 5/3 BCx: NGTD 5/4 MRSA PCR: negative 5/5 Sputum cx reincubated for better growth   5/3 CXR: Right middle lobe airspace disease concerning for lobar Pneumonia.  Thank you for allowing pharmacy to be a part of this patient's care.  Rocky Morel 07/14/2017 11:14 AM

## 2017-07-14 NOTE — Progress Notes (Signed)
[facility] at [place] NAME: [name]    MR#:  [id number]  DATE OF BIRTH:  12/31/1955  SUBJECTIVE:  CHIEF COMPLAINT:   Chief Complaint  Patient presents with  . Respiratory Distress   -Feels much better.  Still has left olecranon bursa swelling. -Still requiring 3 L oxygen which is acute  REVIEW OF SYSTEMS:  Review of Systems  Constitutional: Negative for chills, fever and malaise/fatigue.  HENT: Negative for congestion, ear discharge, hearing loss and nosebleeds.   Respiratory: Positive for shortness of breath. Negative for cough and wheezing.   Cardiovascular: Negative for chest pain, palpitations and leg swelling.  Gastrointestinal: Negative for abdominal pain, constipation, diarrhea, nausea and vomiting.  Genitourinary: Negative for dysuria.  Musculoskeletal: Positive for joint pain and myalgias.  Neurological: Negative for dizziness, focal weakness, seizures, weakness and headaches.    DRUG ALLERGIES:  No Known Allergies  VITALS:  Blood pressure (!) 161/99, pulse 78, temperature 98.3 F (36.8 C), resp. rate 18, height 5\' 10"  (1.778 m), weight 69.7 kg (153 lb 9.6 oz), SpO2 96 %.  PHYSICAL EXAMINATION:  Physical Exam  GENERAL:  62 y.o.-year-old patient lying in the bed with no acute distress.  EYES: Pupils equal, round, reactive to light and accommodation. No scleral icterus. Extraocular muscles intact.  HEENT: Head atraumatic, normocephalic. Oropharynx and nasopharynx clear.  NECK:  Supple, no jugular venous distention. No thyroid enlargement, no tenderness.  LUNGS: Very tight on auscultation, scant breath sounds, no audible wheezing, no rales,rhonchi or crepitation. No use of accessory muscles of respiration.  CARDIOVASCULAR: S1, S2 normal. No murmurs, rubs, or gallops.  ABDOMEN: Soft, nontender, nondistended. Bowel sounds present. No organomegaly or mass.  EXTREMITIES: No pedal edema, cyanosis, or clubbing.  Left  olecranon bursa swelling noted. NEUROLOGIC: Cranial nerves II through XII are intact. Muscle strength 5/5 in all extremities. Sensation intact. Gait not checked.  PSYCHIATRIC: The patient is alert and oriented x 3.  SKIN: No obvious rash, lesion, or ulcer.    LABORATORY PANEL:   CBC Recent Labs  Lab 07/14/17 0542  WBC 12.9*  HGB 13.0  HCT 38.5*  PLT 93*   ------------------------------------------------------------------------------------------------------------------  Chemistries  Recent Labs  Lab 07/14/17 0542  NA 141  K 4.1  CL 108  CO2 27  GLUCOSE 211*  BUN 20  CREATININE 0.68  CALCIUM 8.2*  AST 36  ALT 35  ALKPHOS 92  BILITOT 1.1   ------------------------------------------------------------------------------------------------------------------  Cardiac Enzymes Recent Labs  Lab 07/11/17 2016  TROPONINI 0.03*   ------------------------------------------------------------------------------------------------------------------  RADIOLOGY:  Ct Elbow Left Wo Contrast  Result Date: 07/13/2017 CLINICAL DATA:  Painful swelling the left elbow.  Bursitis. EXAM: CT OF THE UPPER LEFT EXTREMITY WITHOUT CONTRAST TECHNIQUE: Multidetector CT imaging of the left elbow was performed according to the standard protocol. COMPARISON:  None. FINDINGS: Bones/Joint/Cartilage No evidence of acute fracture or dislocation. The joint spaces are preserved. There are no significant arthropathic changes. There is a small elbow joint effusion. Ligaments Suboptimally assessed by CT. Muscles and Tendons Unremarkable.  The biceps and triceps tendons appear. Soft tissues There is a large amount of fluid within the olecranon bursa, measuring 3.8 cm on image 21/8 and 4.4 cm on image 49/10. There are no associated calcifications in this area or erosion of the olecranon process. There is generalized edema throughout the subcutaneous fat of the distal upper arm and proximal forearm dorsally. IMPRESSION: 1.  Olecranon bursitis.  This can be a manifestation of gout. 2.  Mild nonspecific edema throughout the dorsal soft tissues. 3. Small elbow joint effusion.  No acute osseous findings. Electronically Signed   By: Richardean Sale M.D.   On: 07/13/2017 13:52   Dg Chest Port 1 View  Result Date: 07/13/2017 CLINICAL DATA:  62 year old male with history of pneumonia. EXAM: PORTABLE CHEST 1 VIEW COMPARISON:  Chest x-ray 07/11/2017.  Chest CT 07/11/2017. FINDINGS: The patchy interstitial prominence and airspace disease seen on the prior study has significantly improved, but has yet to completely resolve. The most confluent area of residual airspace consolidation is now in the periphery of the lower left lung, likely in the lingula. No pleural effusions. No evidence of pulmonary edema. Emphysematous changes are noted throughout the lungs. Heart size is normal. Upper mediastinal contours are within normal limits. Aortic atherosclerosis. IMPRESSION: 1. Improving multilobar pneumonia, as above. 2. Aortic atherosclerosis. 3. Emphysema. Electronically Signed   By: Vinnie Langton M.D.   On: 07/13/2017 07:20    EKG:   Orders placed or performed during the hospital encounter of 07/11/17  . ED EKG  . ED EKG  . EKG 12-Lead  . EKG 12-Lead    ASSESSMENT AND PLAN:   62 year old male with past medical history significant for COPD not on home oxygen, hepatitis C with cirrhosis, GERD, hypertension brought in secondary to acute hypoxic respiratory failure  1.  Acute hypoxic and hypercarbic respiratory failure-secondary to COPD exacerbation and also pneumonia-right middle lobe infiltrate -MRSA PCR is negative.  Off vancomycin -On cefepime.  Change to oral antibiotics tomorrow -on steroids and nebulizer treatments -On 3L  nasal cannula- not on home o2-continue to wean as tolerated -Appreciate pulmonary critical care consult  2.  Hepatitis C and liver cirrhosis-follows up with GI.  On Harvoni for treatment-we will  continue that  3.  Chronic pain-has been on methadone, states his psychiatrist dispenses it.  Noted in his previous discharge summary as well.  Patient was able to verify the dose -Continue methadone  4.  DVT prophylaxis-Lovenox  5.  Left olecranial bursitis-forearm swelling is much improved, ultrasound negative for any DVT. -CT of the left elbow showing olecranon bursitis.  Pain is chronic.  On systemic steroids.  Outpatient follow-up with orthopedics if needed  encourage ambulation Discharge once able to be weaned off oxygen    All the records are reviewed and case discussed with Care Management/Social Workerr. Management plans discussed with the patient, family and they are in agreement.  CODE STATUS: Full Code  TOTAL TIME TAKING CARE OF THIS PATIENT: 33 minutes.   POSSIBLE D/C TOMORROW, DEPENDING ON CLINICAL CONDITION.   Shazia Mitchener M.D on 07/14/2017 at 2:15 PM  Between 7am to 6pm - Pager - [phone]  After 6pm go to [email or web address] - password [email or web address] [facility]  Office  [phone]  CC: Primary care physician; Theotis Burrow, MD

## 2017-07-14 NOTE — Progress Notes (Signed)
Inpatient Diabetes Program Recommendations  AACE/ADA: New Consensus Statement on Inpatient Glycemic Control (2015)  Target Ranges:  Prepandial:   less than 140 mg/dL      Peak postprandial:   less than 180 mg/dL (1-2 hours)      Critically ill patients:  140 - 180 mg/dL   Results for ARGIL, MAHL (MRN [id number]) as of 07/14/2017 10:20  Ref. Range 07/13/2017 07:08 07/13/2017 11:47 07/13/2017 16:56 07/13/2017 20:53  Glucose-Capillary Latest Ref Range: 65 - 99 mg/dL 254 (H)  9 units NOVOLOG  274 (H)  8 units NOVOLOG  189 (H)  3 units NOVOLOG  233 (H)  2 units NOVOLOG    Results for EASTON, FETTY (MRN [id number]) as of 07/14/2017 10:20  Ref. Range 07/14/2017 07:17  Glucose-Capillary Latest Ref Range: 65 - 99 mg/dL 206 (H)  5 units NOVOLOG      Admit: Pneumonia  NO History of Diabetes noted  Current Insulin Orders: Novolog Moderate Correction Scale/ SSI (0-15 units) TID AC + HS      Note patient receiving Solumedrol 40 mg Q8H.  CBGs likely elevated due to steroids.  No History of Diabetes noted.    MD- Please consider the following in-hospital insulin adjustments while patient getting IV Steroids:  1. Start low dose Lantus:  Lantus 7 units daily (0.1 units/kg dosing)  2. Continue Novolog Sensitive SSI     --Will follow patient during hospitalization--  Wyn Quaker RN, MSN, CDE Diabetes Coordinator Inpatient Glycemic Control Team Team Pager: [phone] (8a-5p)

## 2017-07-15 LAB — HEMOGLOBIN A1C
Hgb A1c MFr Bld: 6.7 % — ABNORMAL HIGH (ref 4.8–5.6)
Mean Plasma Glucose: 145.59 mg/dL

## 2017-07-15 LAB — GLUCOSE, CAPILLARY: Glucose-Capillary: 233 mg/dL — ABNORMAL HIGH (ref 65–99)

## 2017-07-15 MED ORDER — PREDNISONE 10 MG (21) PO TBPK: ORAL_TABLET | ORAL | 0 refills | Status: DC

## 2017-07-15 MED ORDER — IPRATROPIUM-ALBUTEROL 0.5-2.5 (3) MG/3ML IN SOLN: 3.0000 mL | Freq: Four times a day (QID) | RESPIRATORY_TRACT | 1 refills | Status: AC | PRN

## 2017-07-15 MED ORDER — CEFUROXIME AXETIL 500 MG PO TABS: 500.0000 mg | ORAL_TABLET | Freq: Two times a day (BID) | ORAL | 0 refills | Status: DC

## 2017-07-15 MED ORDER — METFORMIN HCL 500 MG PO TABS: 500.0000 mg | ORAL_TABLET | Freq: Two times a day (BID) | ORAL | 2 refills | Status: DC

## 2017-07-15 NOTE — Progress Notes (Signed)
SATURATION QUALIFICATIONS: (This note is used to comply with regulatory documentation for home oxygen)  Patient Saturations on Room Air at Rest = 95%  Patient Saturations on Room Air while Ambulating = 92%  Patient Saturations on 0 Liters of oxygen while Ambulating = n/a%  Please briefly explain why patient needs home oxygen:

## 2017-07-15 NOTE — Discharge Summary (Signed)
[facility] NAME: [name]    MR#:  [id number]  DATE OF BIRTH:  1955-03-22  DATE OF ADMISSION:  07/11/2017   ADMITTING PHYSICIAN: Lance Coon, MD  DATE OF DISCHARGE: 07/15/2017  PRIMARY CARE PHYSICIAN: Theotis Burrow, MD   ADMISSION DIAGNOSIS:   Acute respiratory failure with hypoxia and hypercapnia ([name]) [J96.01, J96.02] Multifocal pneumonia [J18.9]  DISCHARGE DIAGNOSIS:   Principal Problem:   Acute respiratory failure with hypoxia and hypercapnia ([name]) Active Problems:   HCAP (healthcare-associated pneumonia)   COPD with acute exacerbation ([name])   GERD (gastroesophageal reflux disease)   HTN (hypertension)   Cirrhosis ([place])   SECONDARY DIAGNOSIS:   Past Medical History:  Diagnosis Date  . Anxiety   . Cirrhosis ([place])   . COPD (chronic obstructive pulmonary disease) ([place])   . GERD (gastroesophageal reflux disease)   . Hepatitis C   . HTN (hypertension)     HOSPITAL COURSE:   62 year old male with past medical history significant for COPD not on home oxygen, hepatitis C with cirrhosis, GERD, hypertension brought in secondary to acute hypoxic respiratory failure  1.  Acute hypoxic and hypercarbic respiratory failure-secondary to COPD exacerbation and also pneumonia-right middle lobe infiltrate -MRSA PCR is negative.  -Received vancomycin and cefepime in the hospital.  Be discharged on Ceftin. -on steroids and nebulizer treatments-given prescription for nebulizer.  Also prednisone taper at discharge -On 3L  nasal cannula on admission-currently weaned off of oxygen -Appreciate pulmonary critical care consult  2.  Hepatitis C and liver cirrhosis-follows up with GI.  On meds for treatment-we will continue that  3.  Chronic pain-has been on methadone, states his psychiatrist dispenses it.  Noted in his previous discharge summary as well.  Patient was able to verify the dose -Continue  methadone  4.    Hyperglycemia-likely being on chronic steroids.  A1c is at 6.7.  Will start on metformin twice daily and outpatient follow-up recommended.  Dietary carbohydrate restriction advised  5.  Left olecranial bursitis-forearm swelling is much improved, ultrasound negative for any DVT. -CT of the left elbow showing olecranon bursitis.  Pain is chronic.  On systemic steroids.  Outpatient follow-up with orthopedics if needed for a steroid injection  Weaned off oxygen.  Ambulating well.  Will be discharged home today    DISCHARGE CONDITIONS:   Guarded  CONSULTS OBTAINED:   ICU attending consult  DRUG ALLERGIES:   No Known Allergies DISCHARGE MEDICATIONS:   Allergies as of 07/15/2017   No Known Allergies     Medication List    TAKE these medications   albuterol 108 (90 Base) MCG/ACT inhaler Commonly known as:  PROVENTIL HFA;VENTOLIN HFA Inhale into the lungs every 6 (six) hours as needed for wheezing or shortness of breath.   alfuzosin 10 MG 24 hr tablet Commonly known as:  UROXATRAL Take 10 mg by mouth 2 (two) times daily.   ALPRAZolam 1 MG tablet Commonly known as:  XANAX Take 1 mg by mouth 3 (three) times daily. What changed:  Another medication with the same name was removed. Continue taking this medication, and follow the directions you see here.   budesonide-formoterol 160-4.5 MCG/ACT inhaler Commonly known as:  SYMBICORT Inhale 2 puffs into the lungs 2 (two) times daily.   cefUROXime 500 MG tablet Commonly known as:  CEFTIN Take 1 tablet (500 mg total) by mouth 2 (two) times daily with a meal for 7 days.   EPCLUSA 400-100 MG  Tabs Generic drug:  Sofosbuvir-Velpatasvir Take 1 tablet by mouth daily.   ipratropium-albuterol 0.5-2.5 (3) MG/3ML Soln Commonly known as:  DUONEB Take 3 mLs by nebulization every 6 (six) hours as needed (wheezing, shortness of breath).   lactulose 10 GM/15ML solution Commonly known as:  CHRONULAC Take 10 g by mouth 2  (two) times daily.   metFORMIN 500 MG tablet Commonly known as:  GLUCOPHAGE Take 1 tablet (500 mg total) by mouth 2 (two) times daily with a meal.   methadone 10 MG tablet Commonly known as:  DOLOPHINE Take 20 mg by mouth every 8 (eight) hours. What changed:  Another medication with the same name was removed. Continue taking this medication, and follow the directions you see here.   predniSONE 10 MG (21) Tbpk tablet Commonly known as:  STERAPRED UNI-PAK 21 TAB 6 tabs PO x 1 day 5 tabs PO x 1 day 4 tabs PO x 1 day 3 tabs PO x 1 day 2 tabs PO x 1 day 1 tab PO x 1 day and stop   tiotropium 18 MCG inhalation capsule Commonly known as:  SPIRIVA Place 18 mcg into inhaler and inhale daily.            Durable Medical Equipment  (From admission, onward)        Start     Ordered   07/15/17 0955  For home use only DME Nebulizer machine  Once    Question:  Patient needs a nebulizer to treat with the following condition  Answer:  COPD (chronic obstructive pulmonary disease) ([place])   07/15/17 0954       DISCHARGE INSTRUCTIONS:   1.  PCP follow-up in 1 to 2 weeks 2.  Follow-up with GI in 2 to 3 weeks  DIET:   Cardiac diet  ACTIVITY:   Activity as tolerated  OXYGEN:   Home Oxygen: No.  Oxygen Delivery: room air  DISCHARGE LOCATION:   home   If you experience worsening of your admission symptoms, develop shortness of breath, life threatening emergency, suicidal or homicidal thoughts you must seek medical attention immediately by calling 911 or calling your MD immediately  if symptoms less severe.  You Must read complete instructions/literature along with all the possible adverse reactions/side effects for all the Medicines you take and that have been prescribed to you. Take any new Medicines after you have completely understood and accpet all the possible adverse reactions/side effects.   Please note  You were cared for by a hospitalist during your hospital stay.  If you have any questions about your discharge medications or the care you received while you were in the hospital after you are discharged, you can call the unit and asked to speak with the hospitalist on call if the hospitalist that took care of you is not available. Once you are discharged, your primary care physician will handle any further medical issues. Please note that NO REFILLS for any discharge medications will be authorized once you are discharged, as it is imperative that you return to your primary care physician (or establish a relationship with a primary care physician if you do not have one) for your aftercare needs so that they can reassess your need for medications and monitor your lab values.    On the day of Discharge:  VITAL SIGNS:   Blood pressure (!) 167/92, pulse 79, temperature 97.8 F (36.6 C), resp. rate 17, height 5\' 10"  (1.778 m), weight 68.1 kg (150 lb 3.2 oz), SpO2 95 %.  PHYSICAL EXAMINATION:   GENERAL:  62 y.o.-year-old patient lying in the bed with no acute distress.  EYES: Pupils equal, round, reactive to light and accommodation. No scleral icterus. Extraocular muscles intact.  HEENT: Head atraumatic, normocephalic. Oropharynx and nasopharynx clear.  NECK:  Supple, no jugular venous distention. No thyroid enlargement, no tenderness.  LUNGS: Scant breath sounds on auscultation, improved wheezing, no rales,rhonchi or crepitation. No use of accessory muscles of respiration.  CARDIOVASCULAR: S1, S2 normal. No murmurs, rubs, or gallops.  ABDOMEN: Soft, nontender, nondistended. Bowel sounds present. No organomegaly or mass.  EXTREMITIES: No pedal edema, cyanosis, or clubbing.  Left olecranon bursa swelling much improved. NEUROLOGIC: Cranial nerves II through XII are intact. Muscle strength 5/5 in all extremities. Sensation intact. Gait not checked.  PSYCHIATRIC: The patient is alert and oriented x 3.  SKIN: No obvious rash, lesion, or ulcer.     DATA REVIEW:    CBC Recent Labs  Lab 07/14/17 0542  WBC 12.9*  HGB 13.0  HCT 38.5*  PLT 93*    Chemistries  Recent Labs  Lab 07/14/17 0542  NA 141  K 4.1  CL 108  CO2 27  GLUCOSE 211*  BUN 20  CREATININE 0.68  CALCIUM 8.2*  AST 36  ALT 35  ALKPHOS 92  BILITOT 1.1     Microbiology Results  Results for orders placed or performed during the hospital encounter of 07/11/17  Blood culture (routine x 2)     Status: None (Preliminary result)   Collection Time: 07/11/17  8:41 PM  Result Value Ref Range Status   Specimen Description BLOOD RIGHT FOREARM  Final   Special Requests   Final    BOTTLES DRAWN AEROBIC AND ANAEROBIC Blood Culture adequate volume   Culture   Final    NO GROWTH 4 DAYS Performed at [facility], [address] [place], [place] [address]    Report Status PENDING  Incomplete  Blood culture (routine x 2)     Status: None (Preliminary result)   Collection Time: 07/11/17  8:41 PM  Result Value Ref Range Status   Specimen Description BLOOD LEFT ARM  Final   Special Requests   Final    BOTTLES DRAWN AEROBIC AND ANAEROBIC Blood Culture adequate volume   Culture   Final    NO GROWTH 4 DAYS Performed at [facility], [address], [place], [place] [address]    Report Status PENDING  Incomplete  MRSA PCR Screening     Status: None   Collection Time: 07/12/17  1:35 AM  Result Value Ref Range Status   MRSA by PCR NEGATIVE NEGATIVE Final    Comment:        The GeneXpert MRSA Assay (FDA approved for NASAL specimens only), is one component of a comprehensive MRSA colonization surveillance program. It is not intended to diagnose MRSA infection nor to guide or monitor treatment for MRSA infections. Performed at [facility], [address] [place], [place] [address]   Culture, expectorated sputum-assessment     Status: None   Collection Time: 07/13/17  2:01 PM  Result Value Ref Range Status   Specimen Description SPUTUM  Final    Special Requests NONE  Final   Sputum evaluation   Final    THIS SPECIMEN IS ACCEPTABLE FOR SPUTUM CULTURE Performed at [facility], [address] [place], [place] [address]    Report Status 07/14/2017 FINAL  Final  Culture, respiratory (NON-Expectorated)     Status:  None (Preliminary result)   Collection Time: 07/13/17  2:01 PM  Result Value Ref Range Status   Specimen Description   Final    SPUTUM Performed at [facility], [address] [place], [place] [address]    Special Requests   Final    NONE Reflexed from [id number] Performed at [facility], [address] [place], [place] [address]    Gram Stain   Final    MODERATE WBC PRESENT,BOTH PMN AND MONONUCLEAR RARE SQUAMOUS EPITHELIAL CELLS PRESENT MODERATE GRAM POSITIVE COCCI IN PAIRS    Culture   Final    CULTURE REINCUBATED FOR BETTER GROWTH Performed at [facility], [place] [address], [place], [place] [address]    Report Status PENDING  Incomplete    RADIOLOGY:  No results found.   Management plans discussed with the patient, family and they are in agreement.  CODE STATUS:     Code Status Orders  (From admission, onward)        Start     Ordered   07/12/17 0140  Full code  Continuous     07/12/17 0139    Code Status History    This patient has a current code status but no historical code status.      TOTAL TIME TAKING CARE OF THIS PATIENT: 39 minutes.    Gladstone Lighter M.D on 07/15/2017 at 9:56 AM  Between 7am to 6pm - Pager - [phone]  After 6pm go to [email or web address] - Proofreader  [facility]  Office  [phone]  CC: Primary care physician; Theotis Burrow, MD   Note: This dictation was prepared with Dragon dictation along with smaller phrase technology. Any transcriptional errors that result from this process are unintentional.

## 2017-07-15 NOTE — Progress Notes (Signed)
Inpatient Diabetes Program Recommendations  AACE/ADA: New Consensus Statement on Inpatient Glycemic Control (2015)  Target Ranges:  Prepandial:   less than 140 mg/dL      Peak postprandial:   less than 180 mg/dL (1-2 hours)      Critically ill patients:  140 - 180 mg/dL    Results for VEDANTH, SIRICO (MRN [id number]) as of 07/15/2017 09:35  Ref. Range 07/14/2017 07:17 07/14/2017 11:50 07/14/2017 16:28 07/14/2017 20:54  Glucose-Capillary Latest Ref Range: 65 - 99 mg/dL 206 (H) 277 (H) 145 (H) 129 (H)   Results for IZEN, PETZ (MRN [id number]) as of 07/15/2017 09:35  Ref. Range 07/15/2017 07:24  Glucose-Capillary Latest Ref Range: 65 - 99 mg/dL 233 (H)   Results for TREA, LATNER (MRN [id number]) as of 07/15/2017 09:35  Ref. Range 07/14/2017 05:42  Hemoglobin A1C Latest Ref Range: 4.8 - 5.6 % 6.7 (H)    Admit: Pneumonia  NO History of Diabetes noted  Current Insulin Orders: Novolog Moderate Correction Scale/ SSI (0-15 units) TID AC + HS      Note patient receiving Solumedrol 40 mg Q8H.  CBGs likely elevated due to steroids.  No History of Diabetes noted.    MD- Current A1c= 6.7%.  Is this a new diagnosis of DM?  If so, please address with patient so that the RNs can begin basic DM education with patient.  Please also consider the following in-hospital insulin adjustments while patient getting IV Steroids:  1. Start low dose Lantus:  Lantus 7 units daily (0.1 units/kg dosing)  2. Continue Novolog Sensitive SSI    --Will follow patient during hospitalization--  Wyn Quaker RN, MSN, CDE Diabetes Coordinator Inpatient Glycemic Control Team Team Pager: [phone] (8a-5p)

## 2017-07-15 NOTE — Care Management Note (Signed)
Case Management Note  Patient Details  Name: [name] MRN: [id number] Date of Birth: 1955-08-07   Patient to discharge home today. Patient has been weaned to RA.  Order for nebulizer placed.  Patient have chosen for nebulizer to be delivered to home, vs picking it up in the store.   Patient notified that it could take 3-4 days  To arrive to the home, and he would not have it should he have an acute attack in the interim.  Patient understands.  Order send to [facility].   Subjective/Objective:                    Action/Plan:   Expected Discharge Date:  07/15/17               Expected Discharge Plan:  Home/Self Care  In-House Referral:     Discharge planning Services  CM Consult  Post Acute Care Choice:  Durable Medical Equipment Choice offered to:     DME Arranged:  Nebulizer machine DME Agency:  [place]:    [facility] Agency:     Status of Service:  Completed, signed off  If discussed at [place] of Stay Meetings, dates discussed:    Additional Comments:  Beverly Sessions, RN 07/15/2017, 11:47 AM

## 2017-07-16 ENCOUNTER — Encounter: Payer: Self-pay | Admitting: Emergency Medicine

## 2017-07-16 LAB — CULTURE, BLOOD (ROUTINE X 2)
Culture: NO GROWTH
Culture: NO GROWTH
Special Requests: ADEQUATE
Special Requests: ADEQUATE

## 2017-07-17 LAB — CULTURE, RESPIRATORY W GRAM STAIN: Culture: NORMAL

## 2017-07-17 LAB — CULTURE, RESPIRATORY

## 2017-07-21 ENCOUNTER — Other Ambulatory Visit: Payer: Self-pay

## 2017-07-21 ENCOUNTER — Inpatient Hospital Stay: Payer: Medicaid Other

## 2017-07-21 ENCOUNTER — Emergency Department: Payer: Medicaid Other

## 2017-07-21 ENCOUNTER — Encounter: Payer: Self-pay | Admitting: *Deleted

## 2017-07-21 ENCOUNTER — Inpatient Hospital Stay
Admission: EM | Admit: 2017-07-21 | Discharge: 2017-07-26 | DRG: 193 | Disposition: A | Discharge status: Home / Self Care | Payer: Medicaid Other | Attending: Internal Medicine | Admitting: Internal Medicine

## 2017-07-21 DIAGNOSIS — N400 Enlarged prostate without lower urinary tract symptoms: Secondary | ICD-10-CM | POA: Diagnosis present

## 2017-07-21 DIAGNOSIS — R000 Tachycardia, unspecified: Secondary | ICD-10-CM | POA: Diagnosis present

## 2017-07-21 DIAGNOSIS — R0902 Hypoxemia: Secondary | ICD-10-CM

## 2017-07-21 DIAGNOSIS — R188 Other ascites: Secondary | ICD-10-CM | POA: Diagnosis present

## 2017-07-21 DIAGNOSIS — Z79899 Other long term (current) drug therapy: Secondary | ICD-10-CM

## 2017-07-21 DIAGNOSIS — Z818 Family history of other mental and behavioral disorders: Secondary | ICD-10-CM

## 2017-07-21 DIAGNOSIS — Z9841 Cataract extraction status, right eye: Secondary | ICD-10-CM

## 2017-07-21 DIAGNOSIS — B182 Chronic viral hepatitis C: Secondary | ICD-10-CM | POA: Diagnosis present

## 2017-07-21 DIAGNOSIS — Z9089 Acquired absence of other organs: Secondary | ICD-10-CM

## 2017-07-21 DIAGNOSIS — J9621 Acute and chronic respiratory failure with hypoxia: Secondary | ICD-10-CM | POA: Diagnosis present

## 2017-07-21 DIAGNOSIS — Y95 Nosocomial condition: Secondary | ICD-10-CM | POA: Diagnosis present

## 2017-07-21 DIAGNOSIS — F419 Anxiety disorder, unspecified: Secondary | ICD-10-CM | POA: Diagnosis present

## 2017-07-21 DIAGNOSIS — Z7951 Long term (current) use of inhaled steroids: Secondary | ICD-10-CM

## 2017-07-21 DIAGNOSIS — J440 Chronic obstructive pulmonary disease with acute lower respiratory infection: Secondary | ICD-10-CM | POA: Diagnosis present

## 2017-07-21 DIAGNOSIS — Y92230 Patient room in hospital as the place of occurrence of the external cause: Secondary | ICD-10-CM | POA: Diagnosis present

## 2017-07-21 DIAGNOSIS — R7989 Other specified abnormal findings of blood chemistry: Secondary | ICD-10-CM

## 2017-07-21 DIAGNOSIS — Z961 Presence of intraocular lens: Secondary | ICD-10-CM | POA: Diagnosis present

## 2017-07-21 DIAGNOSIS — Z9842 Cataract extraction status, left eye: Secondary | ICD-10-CM | POA: Diagnosis not present

## 2017-07-21 DIAGNOSIS — K7460 Unspecified cirrhosis of liver: Secondary | ICD-10-CM | POA: Diagnosis present

## 2017-07-21 DIAGNOSIS — Z8042 Family history of malignant neoplasm of prostate: Secondary | ICD-10-CM

## 2017-07-21 DIAGNOSIS — J181 Lobar pneumonia, unspecified organism: Secondary | ICD-10-CM | POA: Diagnosis present

## 2017-07-21 DIAGNOSIS — M545 Low back pain: Secondary | ICD-10-CM | POA: Diagnosis present

## 2017-07-21 DIAGNOSIS — R0602 Shortness of breath: Secondary | ICD-10-CM

## 2017-07-21 DIAGNOSIS — G894 Chronic pain syndrome: Secondary | ICD-10-CM | POA: Diagnosis present

## 2017-07-21 DIAGNOSIS — R778 Other specified abnormalities of plasma proteins: Secondary | ICD-10-CM

## 2017-07-21 DIAGNOSIS — T473X6A Underdosing of saline and osmotic laxatives, initial encounter: Secondary | ICD-10-CM | POA: Diagnosis present

## 2017-07-21 DIAGNOSIS — Z9981 Dependence on supplemental oxygen: Secondary | ICD-10-CM | POA: Diagnosis not present

## 2017-07-21 DIAGNOSIS — K7290 Hepatic failure, unspecified without coma: Secondary | ICD-10-CM | POA: Diagnosis present

## 2017-07-21 DIAGNOSIS — E1165 Type 2 diabetes mellitus with hyperglycemia: Secondary | ICD-10-CM | POA: Diagnosis present

## 2017-07-21 DIAGNOSIS — R5383 Other fatigue: Secondary | ICD-10-CM | POA: Diagnosis present

## 2017-07-21 DIAGNOSIS — Z79891 Long term (current) use of opiate analgesic: Secondary | ICD-10-CM

## 2017-07-21 DIAGNOSIS — I10 Essential (primary) hypertension: Secondary | ICD-10-CM | POA: Diagnosis present

## 2017-07-21 DIAGNOSIS — I340 Nonrheumatic mitral (valve) insufficiency: Secondary | ICD-10-CM | POA: Diagnosis not present

## 2017-07-21 DIAGNOSIS — Z87891 Personal history of nicotine dependence: Secondary | ICD-10-CM

## 2017-07-21 DIAGNOSIS — Z91128 Patient's intentional underdosing of medication regimen for other reason: Secondary | ICD-10-CM

## 2017-07-21 DIAGNOSIS — G9341 Metabolic encephalopathy: Secondary | ICD-10-CM | POA: Diagnosis present

## 2017-07-21 DIAGNOSIS — Z7984 Long term (current) use of oral hypoglycemic drugs: Secondary | ICD-10-CM

## 2017-07-21 DIAGNOSIS — Z841 Family history of disorders of kidney and ureter: Secondary | ICD-10-CM

## 2017-07-21 DIAGNOSIS — T380X5A Adverse effect of glucocorticoids and synthetic analogues, initial encounter: Secondary | ICD-10-CM | POA: Diagnosis present

## 2017-07-21 DIAGNOSIS — J189 Pneumonia, unspecified organism: Secondary | ICD-10-CM | POA: Diagnosis present

## 2017-07-21 DIAGNOSIS — A419 Sepsis, unspecified organism: Secondary | ICD-10-CM

## 2017-07-21 LAB — CBC WITH DIFFERENTIAL/PLATELET
BASOS PCT: 1 %
Basophils Absolute: 0 10*3/uL (ref 0–0.1)
EOS ABS: 0.2 10*3/uL (ref 0–0.7)
EOS PCT: 2 %
HCT: 45.2 % (ref 40.0–52.0)
Hemoglobin: 15.2 g/dL (ref 13.0–18.0)
Lymphocytes Relative: 16 %
Lymphs Abs: 1.5 10*3/uL (ref 1.0–3.6)
MCH: 33.8 pg (ref 26.0–34.0)
MCHC: 33.7 g/dL (ref 32.0–36.0)
MCV: 100.3 fL — ABNORMAL HIGH (ref 80.0–100.0)
Monocytes Absolute: 0.8 10*3/uL (ref 0.2–1.0)
Monocytes Relative: 9 %
NEUTROS PCT: 72 %
Neutro Abs: 6.7 10*3/uL — ABNORMAL HIGH (ref 1.4–6.5)
PLATELETS: 91 10*3/uL — AB (ref 150–440)
RBC: 4.51 MIL/uL (ref 4.40–5.90)
RDW: 14 % (ref 11.5–14.5)
WBC: 9.3 10*3/uL (ref 3.8–10.6)

## 2017-07-21 LAB — BRAIN NATRIURETIC PEPTIDE: B Natriuretic Peptide: 40 pg/mL (ref 0.0–100.0)

## 2017-07-21 LAB — LACTIC ACID, PLASMA
LACTIC ACID, VENOUS: 2.7 mmol/L — AB (ref 0.5–1.9)
Lactic Acid, Venous: 3.1 mmol/L (ref 0.5–1.9)

## 2017-07-21 LAB — COMPREHENSIVE METABOLIC PANEL
ALK PHOS: 120 U/L (ref 38–126)
ALT: 49 U/L (ref 17–63)
ANION GAP: 4 — AB (ref 5–15)
AST: 50 U/L — ABNORMAL HIGH (ref 15–41)
Albumin: 3 g/dL — ABNORMAL LOW (ref 3.5–5.0)
BILIRUBIN TOTAL: 1.3 mg/dL — AB (ref 0.3–1.2)
BUN: 15 mg/dL (ref 6–20)
CALCIUM: 8.5 mg/dL — AB (ref 8.9–10.3)
CO2: 29 mmol/L (ref 22–32)
CREATININE: 0.75 mg/dL (ref 0.61–1.24)
Chloride: 104 mmol/L (ref 101–111)
Glucose, Bld: 309 mg/dL — ABNORMAL HIGH (ref 65–99)
Potassium: 4.4 mmol/L (ref 3.5–5.1)
SODIUM: 137 mmol/L (ref 135–145)
TOTAL PROTEIN: 6.1 g/dL — AB (ref 6.5–8.1)

## 2017-07-21 LAB — BLOOD GAS, VENOUS
Acid-Base Excess: 1.8 mmol/L (ref 0.0–2.0)
BICARBONATE: 29.7 mmol/L — AB (ref 20.0–28.0)
Delivery systems: POSITIVE
FIO2: 100
O2 Saturation: 77.1 %
PCO2 VEN: 59 mmHg (ref 44.0–60.0)
PEEP: 5 cmH2O
PO2 VEN: 46 mmHg — AB (ref 32.0–45.0)
Patient temperature: 37
RATE: 12 resp/min
pH, Ven: 7.31 (ref 7.250–7.430)

## 2017-07-21 LAB — TROPONIN I: TROPONIN I: 0.03 ng/mL — AB (ref <0.03)

## 2017-07-21 LAB — AMMONIA: AMMONIA: 121 umol/L — AB (ref 9–35)

## 2017-07-21 MED ORDER — ONDANSETRON HCL 4 MG/2ML IJ SOLN: 4.0000 mg | Freq: Four times a day (QID) | INTRAMUSCULAR | Status: DC | PRN

## 2017-07-21 MED ORDER — METHADONE HCL 10 MG PO TABS
20.0000 mg | ORAL_TABLET | Freq: Three times a day (TID) | ORAL | Status: DC
  Administered 2017-07-21 – 2017-07-26 (×14): 20 mg via ORAL
  Filled 2017-07-21 (×14): qty 2

## 2017-07-21 MED ORDER — IPRATROPIUM-ALBUTEROL 0.5-2.5 (3) MG/3ML IN SOLN: 3.0000 mL | Freq: Four times a day (QID) | RESPIRATORY_TRACT | Status: DC | PRN

## 2017-07-21 MED ORDER — METHYLPREDNISOLONE SODIUM SUCC 125 MG IJ SOLR
125.0000 mg | Freq: Once | INTRAMUSCULAR | Status: AC
  Administered 2017-07-21: 125 mg via INTRAVENOUS
  Filled 2017-07-21: qty 2

## 2017-07-21 MED ORDER — IOPAMIDOL (ISOVUE-370) INJECTION 76%
75.0000 mL | Freq: Once | INTRAVENOUS | Status: AC | PRN
  Administered 2017-07-21: 75 mL via INTRAVENOUS

## 2017-07-21 MED ORDER — MOMETASONE FURO-FORMOTEROL FUM 200-5 MCG/ACT IN AERO
2.0000 | INHALATION_SPRAY | Freq: Two times a day (BID) | RESPIRATORY_TRACT | Status: DC
  Administered 2017-07-22 – 2017-07-26 (×9): 2 via RESPIRATORY_TRACT
  Filled 2017-07-21: qty 8.8

## 2017-07-21 MED ORDER — VANCOMYCIN HCL 10 G IV SOLR
1250.0000 mg | Freq: Two times a day (BID) | INTRAVENOUS | Status: DC
  Administered 2017-07-21: 1250 mg via INTRAVENOUS
  Filled 2017-07-21 (×3): qty 1250

## 2017-07-21 MED ORDER — INSULIN ASPART 100 UNIT/ML ~~LOC~~ SOLN
0.0000 [IU] | Freq: Three times a day (TID) | SUBCUTANEOUS | Status: DC
  Administered 2017-07-22: 18:00:00 3 [IU] via SUBCUTANEOUS
  Administered 2017-07-22: 5 [IU] via SUBCUTANEOUS
  Administered 2017-07-23: 08:00:00 1 [IU] via SUBCUTANEOUS
  Administered 2017-07-23: 2 [IU] via SUBCUTANEOUS
  Administered 2017-07-23: 18:00:00 5 [IU] via SUBCUTANEOUS
  Filled 2017-07-21 (×6): qty 1

## 2017-07-21 MED ORDER — TIOTROPIUM BROMIDE MONOHYDRATE 18 MCG IN CAPS
18.0000 ug | ORAL_CAPSULE | Freq: Every day | RESPIRATORY_TRACT | Status: DC
  Administered 2017-07-22 – 2017-07-25 (×4): 18 ug via RESPIRATORY_TRACT
  Filled 2017-07-21 (×2): qty 5

## 2017-07-21 MED ORDER — CEFEPIME HCL 2 G IJ SOLR
2.0000 g | Freq: Three times a day (TID) | INTRAMUSCULAR | Status: DC
  Administered 2017-07-21 – 2017-07-26 (×14): 2 g via INTRAVENOUS
  Filled 2017-07-21 (×16): qty 2

## 2017-07-21 MED ORDER — SODIUM CHLORIDE 0.9 % IV SOLN
INTRAVENOUS | Status: AC
  Administered 2017-07-21: 2 g via INTRAVENOUS
  Filled 2017-07-21: qty 2

## 2017-07-21 MED ORDER — SODIUM CHLORIDE 0.9 % IV SOLN
1500.0000 mg | Freq: Once | INTRAVENOUS | Status: AC
  Administered 2017-07-21: 1500 mg via INTRAVENOUS
  Filled 2017-07-21: qty 1500

## 2017-07-21 MED ORDER — POLYETHYLENE GLYCOL 3350 17 G PO PACK: 17.0000 g | PACK | Freq: Every day | ORAL | Status: DC | PRN

## 2017-07-21 MED ORDER — SODIUM CHLORIDE 0.9 % IV BOLUS
1000.0000 mL | Freq: Once | INTRAVENOUS | Status: AC
  Administered 2017-07-21: 1000 mL via INTRAVENOUS

## 2017-07-21 MED ORDER — RIFAXIMIN 550 MG PO TABS
550.0000 mg | ORAL_TABLET | Freq: Two times a day (BID) | ORAL | Status: DC
  Administered 2017-07-22 – 2017-07-26 (×9): 550 mg via ORAL
  Filled 2017-07-21 (×10): qty 1

## 2017-07-21 MED ORDER — ENOXAPARIN SODIUM 40 MG/0.4ML ~~LOC~~ SOLN
40.0000 mg | SUBCUTANEOUS | Status: DC
  Administered 2017-07-23 – 2017-07-25 (×3): 40 mg via SUBCUTANEOUS
  Filled 2017-07-21 (×4): qty 0.4

## 2017-07-21 MED ORDER — ACETAMINOPHEN 325 MG PO TABS: 650.0000 mg | ORAL_TABLET | Freq: Four times a day (QID) | ORAL | Status: DC | PRN

## 2017-07-21 MED ORDER — IPRATROPIUM-ALBUTEROL 0.5-2.5 (3) MG/3ML IN SOLN
3.0000 mL | Freq: Once | RESPIRATORY_TRACT | Status: AC
  Administered 2017-07-21: 3 mL via RESPIRATORY_TRACT
  Filled 2017-07-21: qty 3

## 2017-07-21 MED ORDER — SOFOSBUVIR-VELPATASVIR 400-100 MG PO TABS
1.0000 | ORAL_TABLET | Freq: Every day | ORAL | Status: DC
  Administered 2017-07-23 – 2017-07-25 (×3): 1 via ORAL
  Filled 2017-07-21 (×2): qty 1

## 2017-07-21 MED ORDER — SODIUM CHLORIDE 0.9 % IV SOLN
INTRAVENOUS | Status: DC
  Administered 2017-07-21: via INTRAVENOUS

## 2017-07-21 MED ORDER — PIPERACILLIN-TAZOBACTAM 3.375 G IVPB 30 MIN
3.3750 g | Freq: Once | INTRAVENOUS | Status: AC
  Administered 2017-07-21: 3.375 g via INTRAVENOUS
  Filled 2017-07-21: qty 50

## 2017-07-21 MED ORDER — ONDANSETRON HCL 4 MG PO TABS: 4.0000 mg | ORAL_TABLET | Freq: Four times a day (QID) | ORAL | Status: DC | PRN

## 2017-07-21 MED ORDER — ACETAMINOPHEN 650 MG RE SUPP: 650.0000 mg | Freq: Four times a day (QID) | RECTAL | Status: DC | PRN

## 2017-07-21 MED ORDER — PREDNISONE 50 MG PO TABS
60.0000 mg | ORAL_TABLET | Freq: Every day | ORAL | Status: DC
  Administered 2017-07-22 – 2017-07-25 (×4): 60 mg via ORAL
  Filled 2017-07-21 (×5): qty 1

## 2017-07-21 MED ORDER — ALPRAZOLAM 1 MG PO TABS
1.0000 mg | ORAL_TABLET | Freq: Three times a day (TID) | ORAL | Status: DC | PRN
  Administered 2017-07-22 – 2017-07-26 (×12): 1 mg via ORAL
  Filled 2017-07-21 (×12): qty 1

## 2017-07-21 MED ORDER — HYDROCODONE-ACETAMINOPHEN 5-325 MG PO TABS
1.0000 | ORAL_TABLET | ORAL | Status: DC | PRN
  Administered 2017-07-21: 2 via ORAL
  Administered 2017-07-22: 1 via ORAL
  Administered 2017-07-22 – 2017-07-24 (×2): 2 via ORAL
  Filled 2017-07-21: qty 2
  Filled 2017-07-21: qty 1
  Filled 2017-07-21 (×2): qty 2

## 2017-07-21 MED ORDER — ALFUZOSIN HCL ER 10 MG PO TB24
10.0000 mg | ORAL_TABLET | Freq: Two times a day (BID) | ORAL | Status: DC
  Administered 2017-07-21 – 2017-07-26 (×10): 10 mg via ORAL
  Filled 2017-07-21 (×11): qty 1

## 2017-07-21 MED ORDER — LACTULOSE 10 GM/15ML PO SOLN
30.0000 g | Freq: Two times a day (BID) | ORAL | Status: DC
  Administered 2017-07-21 – 2017-07-26 (×9): 30 g via ORAL
  Filled 2017-07-21 (×11): qty 60

## 2017-07-21 NOTE — ED Notes (Signed)
Pt taken off Bipap and placed on Non-rebreather. Pt tolerating well at this time.
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
## 2017-07-21 NOTE — ED Notes (Signed)
Multiple attempts at second IV have failed. Other staff attempting at this time. All attempted IVs have had pressure held and RN has assured that bleeding has stopped. Pt in NAD at this time.

## 2017-07-21 NOTE — ED Notes (Signed)
Pt attempting to urinate but reports frustration because he has not had medication yet today. RN has released medication and called pharmacy to request medications. Pt sitting on toilet at this time attempting to urinate. NAD noted. PT is wearing NRB at this time.

## 2017-07-21 NOTE — Progress Notes (Signed)
CODE SEPSIS - PHARMACY COMMUNICATION  **Broad Spectrum Antibiotics should be administered within 1 hour of Sepsis diagnosis**  Time Code Sepsis Called/Page Received: 5/13 1249  Antibiotics Ordered: Zosyn/Vanc  Time of 1st antibiotic administration: 1345  Additional action taken by pharmacy: called RN at 1343 concerning ABX not charted yet.  If necessary, Name of Provider/Nurse Contacted:      Noralee Space ,PharmD Clinical Pharmacist  07/21/2017  2:00 PM
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
## 2017-07-21 NOTE — ED Notes (Signed)
Patient transported to 101 

## 2017-07-21 NOTE — Progress Notes (Signed)
Pharmacy Antibiotic Note  [name] is a 62 y.o. male admitted on 07/21/2017 with sepsis/bilat. pneumonia.  Pharmacy has been consulted for Cefepime and Vancomycin dosing.  Patient recently admitted at [facility] 5/3-07/15/17 with HCAP.  Plan: Patient received Zosyn 3.375gm IV x1 in ER and Vancomycin 1500 mg IV x 1 in ER. Will continue with Vancomycin 1250 IV every 12 hours.  Goal trough 15-20 mcg/mL.  Ke 0.081  t 1/2 8.56  Vd 47.6  (Used Scr 0.80) Will check trough prior to 5th dose on 5/15  -Cefepime 2 gm IV q8h    Height: 5\' 10"  (177.8 cm) Weight: 150 lb (68 kg) IBW/kg (Calculated) : 73  No data recorded.  Recent Labs  Lab 07/21/17 1244 07/21/17 1332  WBC 9.3  --   CREATININE  --  0.75  LATICACIDVEN  --  3.1*    Estimated Creatinine Clearance: 92.1 mL/min (by C-G formula based on SCr of 0.75 mg/dL).    No Known Allergies  Antimicrobials this admission: Zosyn x 1 in ER 5/13   >>   Vanc 5/13    >>   Cefepime 5/13 >>  Dose adjustments this admission:    Microbiology results: 5/13 BCx: pending   UCx:      Sputum:    * 07/12/17* from last visit MRSA PCR: neg  Thank you for allowing pharmacy to be a part of this patient's care.  Frances Ambrosino A 07/21/2017 4:08 PM

## 2017-07-21 NOTE — ED Notes (Signed)
Pt placed back on a NRB at 15L of oxygen to test if pt can tolerate. Pt is in NAD at this time.

## 2017-07-21 NOTE — ED Triage Notes (Signed)
Pt to ED via EMS from home with resp. Distress. Fire reported an oxygen saturation in the 60's. EMS gave 1 duoneb that helped to increased O2 to 80. Cpap reportedly caused increased confusion but after pt was taken off Cpap and placed on a Nonrebreather pt and is no longer confused and has a oxygen saturation of 95%. Pt discharged from the hospital after similar event ob the 7th

## 2017-07-21 NOTE — ED Notes (Signed)
Bipap placed back on pt after decreased in oxygenation.

## 2017-07-22 ENCOUNTER — Inpatient Hospital Stay (HOSPITAL_COMMUNITY)
Admit: 2017-07-22 | Discharge: 2017-07-22 | Disposition: A | Discharge status: Home / Self Care | Payer: Medicaid Other | Attending: Internal Medicine | Admitting: Internal Medicine

## 2017-07-22 DIAGNOSIS — I340 Nonrheumatic mitral (valve) insufficiency: Secondary | ICD-10-CM

## 2017-07-22 LAB — ECHOCARDIOGRAM COMPLETE
HEIGHTINCHES: 70 in
Weight: 2577.6 oz

## 2017-07-22 LAB — CBC
HCT: 35.3 % — ABNORMAL LOW (ref 40.0–52.0)
Hemoglobin: 11.8 g/dL — ABNORMAL LOW (ref 13.0–18.0)
MCH: 34.2 pg — AB (ref 26.0–34.0)
MCHC: 33.5 g/dL (ref 32.0–36.0)
MCV: 102.1 fL — ABNORMAL HIGH (ref 80.0–100.0)
PLATELETS: 68 10*3/uL — AB (ref 150–440)
RBC: 3.46 MIL/uL — AB (ref 4.40–5.90)
RDW: 13.6 % (ref 11.5–14.5)
WBC: 8.2 10*3/uL (ref 3.8–10.6)

## 2017-07-22 LAB — URINALYSIS, ROUTINE W REFLEX MICROSCOPIC
BACTERIA UA: NONE SEEN
BILIRUBIN URINE: NEGATIVE
Glucose, UA: >=500 mg/dL — AB
HGB URINE DIPSTICK: NEGATIVE
Ketones, ur: NEGATIVE mg/dL
Leukocytes, UA: NEGATIVE
NITRITE: NEGATIVE
PH: 6 (ref 5.0–8.0)
PROTEIN: NEGATIVE mg/dL
SPECIFIC GRAVITY, URINE: 1.028 (ref 1.005–1.030)
SQUAMOUS EPITHELIAL / LPF: NONE SEEN (ref 0–5)

## 2017-07-22 LAB — GLUCOSE, CAPILLARY
GLUCOSE-CAPILLARY: 269 mg/dL — AB (ref 65–99)
GLUCOSE-CAPILLARY: 256 mg/dL — AB (ref 65–99)
GLUCOSE-CAPILLARY: 250 mg/dL — AB (ref 65–99)
Glucose-Capillary: 70 mg/dL (ref 65–99)
Glucose-Capillary: 112 mg/dL — ABNORMAL HIGH (ref 65–99)
Glucose-Capillary: 227 mg/dL — ABNORMAL HIGH (ref 65–99)

## 2017-07-22 LAB — BASIC METABOLIC PANEL
ANION GAP: 5 (ref 5–15)
BUN: 19 mg/dL (ref 6–20)
CALCIUM: 8 mg/dL — AB (ref 8.9–10.3)
CO2: 27 mmol/L (ref 22–32)
CREATININE: 0.74 mg/dL (ref 0.61–1.24)
Chloride: 108 mmol/L (ref 101–111)
Glucose, Bld: 355 mg/dL — ABNORMAL HIGH (ref 65–99)
Potassium: 4 mmol/L (ref 3.5–5.1)
Sodium: 140 mmol/L (ref 135–145)

## 2017-07-22 MED ORDER — FUROSEMIDE 20 MG PO TABS
20.0000 mg | ORAL_TABLET | Freq: Every day | ORAL | Status: DC
  Administered 2017-07-22 – 2017-07-26 (×5): 20 mg via ORAL
  Filled 2017-07-22 (×5): qty 1

## 2017-07-22 NOTE — Progress Notes (Signed)
[name] at [place] NAME: [name]    MR#:  [id number]  DATE OF BIRTH:  [birth year]  SUBJECTIVE: Patient admitted for shortness of breath, generalized edema, patient was discharged 5 days ago for pneumonia.  BiPAP in the emergency room and he also was lethargic in the ER.  Patient chest x-ray showed worsening bilateral pneumonia on admission.  Initial O2 sat 68% on 2 L, patient received nonrebreather, patient sitting improved, maintaining 92% saturation on room air.Marland Kitchen  CHIEF COMPLAINT:   Chief Complaint  Patient presents with  . Respiratory Distress  Mentioned that he is not taking lactulose regularly secondary to diarrhea.  Ammonia level more than 100 on admission.  It is 121 on admission. He demanded the medicine, methadone, was cursing nurses this morning because he did not get methadone, pain medicine, anxiety medicine. REVIEW OF SYSTEMS:   ROS CONSTITUTIONAL: No fever, fatigue or weakness.  EYES: No blurred or double vision.  EARS, NOSE, AND THROAT: No tinnitus or ear pain.  RESPIRATORY: Mild shortness of breath but better than yesterday.  CARDIOVASCULAR: No chest pain, orthopnea, edema.  GASTROINTESTINAL: No nausea, vomiting, diarrhea .  Complains of slight abdominal pain. GENITOURINARY: No dysuria, hematuria.  ENDOCRINE: No polyuria, nocturia,  HEMATOLOGY: No anemia, easy bruising or bleeding SKIN: No rash or lesion. MUSCULOSKELETAL: No joint pain or arthritis.   NEUROLOGIC: No tingling, numbness, weakness.  PSYCHIATRY: No anxiety or depression.   DRUG ALLERGIES:  No Known Allergies  VITALS:  Blood pressure (!) 156/67, pulse 95, temperature 97.7 F (36.5 C), temperature source Oral, resp. rate 18, height 5\' 10"  (1.778 m), weight 73.1 kg (161 lb 1.6 oz), SpO2 93 %.  PHYSICAL EXAMINATION:  GENERAL:  62 y.o.-year-old patient lying in the bed with no acute distress.  EYES: Pupils equal, round, reactive to light and  accommodation. No scleral icterus. Extraocular muscles intact.  HEENT: Head atraumatic, normocephalic. Oropharynx and nasopharynx clear.  NECK:  Supple, no jugular venous distention. No thyroid enlargement, no tenderness.  LUNGS: Normal breath sounds bilaterally, no wheezing, rales,rhonchi or crepitation. No use of accessory muscles of respiration.  CARDIOVASCULAR: S1, S2 normal. No murmurs, rubs, or gallops.  ABDOMEN: Slightly distended.  No fluid thrill.  Bowel sounds present. EXTREMITIES: No pedal edema, cyanosis, or clubbing.  NEUROLOGIC: Cranial nerves II through XII are intact. Muscle strength 5/5 in all extremities. Sensation intact. Gait not checked.  PSYCHIATRIC: The patient is alert and oriented x 3.  SKIN: No obvious rash, lesion, or ulcer.    LABORATORY PANEL:   CBC Recent Labs  Lab 07/22/17 0330  WBC 8.2  HGB 11.8*  HCT 35.3*  PLT 68*   ------------------------------------------------------------------------------------------------------------------  Chemistries  Recent Labs  Lab 07/21/17 1332 07/22/17 0330  NA 137 140  K 4.4 4.0  CL 104 108  CO2 29 27  GLUCOSE 309* 355*  BUN 15 19  CREATININE 0.75 0.74  CALCIUM 8.5* 8.0*  AST 50*  --   ALT 49  --   ALKPHOS 120  --   BILITOT 1.3*  --    ------------------------------------------------------------------------------------------------------------------  Cardiac Enzymes Recent Labs  Lab 07/21/17 1332  TROPONINI 0.03*   ------------------------------------------------------------------------------------------------------------------  RADIOLOGY:  Ct Angio Chest Pe W And/or Wo Contrast  Result Date: 07/21/2017 CLINICAL DATA:  History of COPD and shortness of breath EXAM: CT ANGIOGRAPHY CHEST WITH CONTRAST TECHNIQUE: Multidetector CT imaging of the chest was performed using the standard protocol during bolus administration of intravenous contrast. Multiplanar CT  image reconstructions and MIPs were obtained  to evaluate the vascular anatomy. CONTRAST:  51mL ISOVUE-370 IOPAMIDOL (ISOVUE-370) INJECTION 76% COMPARISON:  Plain film from earlier in the same day. FINDINGS: Cardiovascular: Thoracic aorta demonstrates atherosclerotic calcifications without aneurysmal dilatation or dissection. No cardiac enlargement is seen. Heavy coronary calcifications are noted. The pulmonary artery shows a normal branching pattern without definitive intraluminal filling defect. Mediastinum/Nodes: Thoracic inlet is within normal limits. Scattered small mediastinal lymph nodes are identified likely reactive in nature. Small right hilar lymph nodes are noted as well also likely reactive in nature. The esophagus is within normal limits. Lungs/Pleura: Diffuse emphysematous changes are identified within both lungs. Some patchy changes are noted in the lingula and right upper lobe as well as more marked infiltrate within the right lower lobe. Small associated right pleural effusion is noted. Mild calcified pleural plaques are seen. Upper Abdomen: The liver is diffusely nodular and somewhat shrunken in appearance consistent with underlying cirrhotic change. Multiple small gallstones are noted. Some significant inflammatory changes are noted surrounding the ascending colon which may simply be related to ascites. Correlation with any abdominal pain is recommended. CT of the abdomen and pelvis may be helpful. Musculoskeletal: Degenerative changes of the thoracic spine are noted. No acute bony abnormality is seen. Chronic compression deformities are noted stable from previous exam dating back to 2015. Review of the MIP images confirms the above findings. IMPRESSION: Diffuse emphysematous changes. Patchy infiltrates bilaterally worst in the right lower lobe consistent with multifocal pneumonia. Multiple calcified pleural plaques. Changes of cirrhosis of the liver with mild ascites. Cholelithiasis is seen. Some increased inflammatory changes surrounding  the ascending colon are noted on the lower images. This may simply be related to the underlying ascites. CT of the abdomen and pelvis may be helpful. Aortic Atherosclerosis (ICD10-I70.0) and Emphysema (ICD10-J43.9). Electronically Signed   By: Inez Catalina M.D.   On: 07/21/2017 14:55   US Abdomen Limited  Result Date: 07/21/2017 CLINICAL DATA:  62 year old with history of hep. C and cirrhosis. Patient presents with shortness of breath. Evaluate for ascites. EXAM: LIMITED ABDOMEN ULTRASOUND FOR ASCITES TECHNIQUE: Limited ultrasound survey for ascites was performed in all four abdominal quadrants. COMPARISON:  Abdominal CT 09/09/2016 FINDINGS: Trace amount of ascites in the abdomen. Bowel loops surrounding this small amount of fluid. IMPRESSION: Trace ascites.  Not enough fluid for a paracentesis. Electronically Signed   By: Markus Daft M.D.   On: 07/21/2017 16:43   Dg Chest Port 1 View  Result Date: 07/21/2017 CLINICAL DATA:  Respiratory distress EXAM: PORTABLE CHEST 1 VIEW COMPARISON:  07/02/2017 FINDINGS: Worsening bilateral lower lobe airspace opacities, right greater than left, concerning for pneumonia. Heart is normal size. No visible effusions. No acute bony abnormality. Diffuse interstitial prominence again noted, likely chronic interstitial lung disease. IMPRESSION: Focal airspace opacities in both lower lobes, right greater than left, worsening since prior study concerning for pneumonia superimposed on chronic interstitial lung disease. Electronically Signed   By: Rolm Baptise M.D.   On: 07/21/2017 13:11    EKG:   Orders placed or performed during the hospital encounter of 07/21/17  . EKG 12-Lead  . EKG 12-Lead  . ED EKG 12-Lead  . ED EKG 12-Lead    ASSESSMENT AND PLAN:  Acute respiratory failure due to combination of pneumonia, ascites in the context of cirrhosis.  Continue antibiotics, added lactulose. 2.  History of hep C, patient takes Harvoni from Dr. Percell [place] office.  According to  sister today the last dose of  Harvoni. 3.  History of liver cirrhosis due to hepatitis C, patient has hepatic encephalopathy when he came with ammonia level around 120.  But today he is alert.  Continue lactulose 30 g p.o. twice daily, I told the patient that he cannot skip lactulose.  And I told the patient sister also over the phone about that. 4.  History of COPD: No wheezing. 5.  Chronic pain syndrome, continue methadone 20 mg every 8 hours, Vicodin 1 to 2 tablets every 4 hours, Xanax 1 mg p.o. 3 times daily as needed   All the records are reviewed and case discussed with Care Management/Social Workerr. Management plans discussed with the patient, family and they are in agreement.  CODE STATUS: Full code  TOTAL TIME TAKING CARE OF THIS PATIENT: 38 minutes.   Possible discharge next 1 to 2-day.   Spoke with patient sister over the phone for about 50 minutes.  More than 50% of time spent in counseling, coordination of care.   Epifanio Lesches M.D on 07/22/2017 at 2:17 PM  Between 7am to 6pm - Pager - [phone]  After 6pm go to [email or web address] - password [email or web address] [facility]  Office  [phone]  CC: Primary care physician; Theotis Burrow, MD   Note: This dictation was prepared with Dragon dictation along with smaller phrase technology. Any transcriptional errors that result from this process are unintentional.

## 2017-07-22 NOTE — Progress Notes (Signed)
*  PRELIMINARY RESULTS* Echocardiogram 2D Echocardiogram has been performed.  [name] 07/22/2017, 9:50 AM

## 2017-07-23 ENCOUNTER — Inpatient Hospital Stay: Payer: Medicaid Other

## 2017-07-23 LAB — GLUCOSE, CAPILLARY
GLUCOSE-CAPILLARY: 275 mg/dL — AB (ref 65–99)
Glucose-Capillary: 150 mg/dL — ABNORMAL HIGH (ref 65–99)
Glucose-Capillary: 152 mg/dL — ABNORMAL HIGH (ref 65–99)
Glucose-Capillary: 284 mg/dL — ABNORMAL HIGH (ref 65–99)

## 2017-07-23 LAB — LEGIONELLA PNEUMOPHILA SEROGP 1 UR AG: L. pneumophila Serogp 1 Ur Ag: NEGATIVE

## 2017-07-23 LAB — AMMONIA: Ammonia: 51 umol/L — ABNORMAL HIGH (ref 9–35)

## 2017-07-23 MED ORDER — INSULIN ASPART 100 UNIT/ML ~~LOC~~ SOLN
0.0000 [IU] | Freq: Three times a day (TID) | SUBCUTANEOUS | Status: DC
  Administered 2017-07-24: 5 [IU] via SUBCUTANEOUS
  Administered 2017-07-24: 1 [IU] via SUBCUTANEOUS
  Administered 2017-07-25 (×2): 3 [IU] via SUBCUTANEOUS
  Administered 2017-07-25: 1 [IU] via SUBCUTANEOUS
  Filled 2017-07-23 (×5): qty 1

## 2017-07-23 MED ORDER — INSULIN ASPART 100 UNIT/ML ~~LOC~~ SOLN
0.0000 [IU] | Freq: Every day | SUBCUTANEOUS | Status: DC
  Administered 2017-07-23 – 2017-07-24 (×2): 3 [IU] via SUBCUTANEOUS
  Administered 2017-07-25: 21:00:00 2 [IU] via SUBCUTANEOUS
  Filled 2017-07-23 (×3): qty 1

## 2017-07-23 NOTE — Progress Notes (Signed)
Inpatient Diabetes Program Recommendations  AACE/ADA: New Consensus Statement on Inpatient Glycemic Control (2015)  Target Ranges:  Prepandial:   less than 140 mg/dL      Peak postprandial:   less than 180 mg/dL (1-2 hours)      Critically ill patients:  140 - 180 mg/dL   Lab Results  Component Value Date   GLUCAP 150 (H) 07/23/2017   HGBA1C 6.7 (H) 07/14/2017    Review of Glycemic ControlResults for BRAXXTON, STOUDT (MRN [id number]) as of 07/23/2017 09:55  Ref. Range 07/22/2017 12:39 07/22/2017 16:53 07/22/2017 21:09 07/22/2017 22:39 07/23/2017 07:32  Glucose-Capillary Latest Ref Range: 65 - 99 mg/dL 112 (H) 227 (H) 256 (H) 250 (H) 150 (H)   Diabetes history: Type 2 DM  Outpatient Diabetes medications: Metformin 500 mg bid Current orders for Inpatient glycemic control:  Novolog sensitive tid with meals Prednisone 60 mg daily Inpatient Diabetes Program Recommendations:    Note patient is on PO Prednisone which increases post-prandial blood sugars.  May consider adding Novolog meal coverage 4 units tid with meals (hold if patient NPO or eats less than 50%).   Thanks,  Adah Perl, RN, BC-ADM Inpatient Diabetes Coordinator Pager [phone] (8a-5p)

## 2017-07-23 NOTE — Progress Notes (Addendum)
PT Cancellation Note  Patient Details Name: [name] MRN: [id number] DOB: Dec 07, 1955   Cancelled Treatment:    Reason Eval/Treat Not Completed: Patient declined, no reason specified.  Order received.  Chart reviewed.  Pt declined, stating that he never started home health PT following previous discharge from hospital and that he is able to walk without an AD.  Pt talkative and difficult to keep on topic.  Will complete order. Addended 07/24/2017, [place], PT, DPT 07/23/2017, 2:24 PM, Addendum 07/24/2017, 1144

## 2017-07-23 NOTE — Progress Notes (Signed)
[place] at [place] NAME: [name]    MR#:  [id number]  DATE OF BIRTH:  December 16, 1955  SUBJECTIVE: 62 year old male patient admitted because of hepatic encephalopathy and also pneumonia.  But patient has ascites and shortness of breath likely secondary to ascites rather than pneumonia.  WBC normal.  Started on lactulose, Lasix.  And he says he feels much better.  CHIEF COMPLAINT:   Chief Complaint  Patient presents with  . Respiratory Distress  Mentioned that he is not taking lactulose regularly secondary to diarrhea.  Ammonia level more than 100 on admission.  It is 121 on admission. He demanded the medicine, methadone, was cursing nurses this morning because he did not get methadone, pain medicine, anxiety medicine. REVIEW OF SYSTEMS:   ROS CONSTITUTIONAL: No fever, fatigue or weakness.  EYES: No blurred or double vision.  EARS, NOSE, AND THROAT: No tinnitus or ear pain.  RESPIRATORY: Mild shortness of breath but better than yesterday.  CARDIOVASCULAR: No chest pain, orthopnea, edema.  GASTROINTESTINAL: No nausea, vomiting, diarrhea .  Complains of slight abdominal pain. GENITOURINARY: No dysuria, hematuria.  ENDOCRINE: No polyuria, nocturia,  HEMATOLOGY: No anemia, easy bruising or bleeding SKIN: No rash or lesion. MUSCULOSKELETAL: No joint pain or arthritis.   NEUROLOGIC: No tingling, numbness, weakness.  PSYCHIATRY: No anxiety or depression.   DRUG ALLERGIES:  No Known Allergies  VITALS:  Blood pressure (!) 144/73, pulse 84, temperature 98 F (36.7 C), temperature source Oral, resp. rate 17, height 5\' 10"  (1.778 m), weight 72.9 kg (160 lb 11.2 oz), SpO2 94 %.  PHYSICAL EXAMINATION:  GENERAL:  62 y.o.-year-old patient lying in the bed with no acute distress.  EYES: Pupils equal, round, reactive to light and accommodation. No scleral icterus. Extraocular muscles intact.  HEENT: Head atraumatic, normocephalic. Oropharynx  and nasopharynx clear.  NECK:  Supple, no jugular venous distention. No thyroid enlargement, no tenderness.  LUNGS: Normal breath sounds bilaterally, no wheezing, rales,rhonchi or crepitation. No use of accessory muscles of respiration.  CARDIOVASCULAR: S1, S2 normal. No murmurs, rubs, or gallops.  ABDOMEN: Slightly distended.  No fluid thrill.  Bowel sounds present. EXTREMITIES: No pedal edema, cyanosis, or clubbing.  NEUROLOGIC: Cranial nerves II through XII are intact. Muscle strength 5/5 in all extremities. Sensation intact. Gait not checked.  PSYCHIATRIC: The patient is alert and oriented x 3.  SKIN: No obvious rash, lesion, or ulcer.    LABORATORY PANEL:   CBC Recent Labs  Lab 07/22/17 0330  WBC 8.2  HGB 11.8*  HCT 35.3*  PLT 68*   ------------------------------------------------------------------------------------------------------------------  Chemistries  Recent Labs  Lab 07/21/17 1332 07/22/17 0330  NA 137 140  K 4.4 4.0  CL 104 108  CO2 29 27  GLUCOSE 309* 355*  BUN 15 19  CREATININE 0.75 0.74  CALCIUM 8.5* 8.0*  AST 50*  --   ALT 49  --   ALKPHOS 120  --   BILITOT 1.3*  --    ------------------------------------------------------------------------------------------------------------------  Cardiac Enzymes Recent Labs  Lab 07/21/17 1332  TROPONINI 0.03*   ------------------------------------------------------------------------------------------------------------------  RADIOLOGY:  Ct Angio Chest Pe W And/or Wo Contrast  Result Date: 07/21/2017 CLINICAL DATA:  History of COPD and shortness of breath EXAM: CT ANGIOGRAPHY CHEST WITH CONTRAST TECHNIQUE: Multidetector CT imaging of the chest was performed using the standard protocol during bolus administration of intravenous contrast. Multiplanar CT image reconstructions and MIPs were obtained to evaluate the vascular anatomy. CONTRAST:  14mL ISOVUE-370 IOPAMIDOL (ISOVUE-370)  INJECTION 76% COMPARISON:   Plain film from earlier in the same day. FINDINGS: Cardiovascular: Thoracic aorta demonstrates atherosclerotic calcifications without aneurysmal dilatation or dissection. No cardiac enlargement is seen. Heavy coronary calcifications are noted. The pulmonary artery shows a normal branching pattern without definitive intraluminal filling defect. Mediastinum/Nodes: Thoracic inlet is within normal limits. Scattered small mediastinal lymph nodes are identified likely reactive in nature. Small right hilar lymph nodes are noted as well also likely reactive in nature. The esophagus is within normal limits. Lungs/Pleura: Diffuse emphysematous changes are identified within both lungs. Some patchy changes are noted in the lingula and right upper lobe as well as more marked infiltrate within the right lower lobe. Small associated right pleural effusion is noted. Mild calcified pleural plaques are seen. Upper Abdomen: The liver is diffusely nodular and somewhat shrunken in appearance consistent with underlying cirrhotic change. Multiple small gallstones are noted. Some significant inflammatory changes are noted surrounding the ascending colon which may simply be related to ascites. Correlation with any abdominal pain is recommended. CT of the abdomen and pelvis may be helpful. Musculoskeletal: Degenerative changes of the thoracic spine are noted. No acute bony abnormality is seen. Chronic compression deformities are noted stable from previous exam dating back to 2015. Review of the MIP images confirms the above findings. IMPRESSION: Diffuse emphysematous changes. Patchy infiltrates bilaterally worst in the right lower lobe consistent with multifocal pneumonia. Multiple calcified pleural plaques. Changes of cirrhosis of the liver with mild ascites. Cholelithiasis is seen. Some increased inflammatory changes surrounding the ascending colon are noted on the lower images. This may simply be related to the underlying ascites. CT of  the abdomen and pelvis may be helpful. Aortic Atherosclerosis (ICD10-I70.0) and Emphysema (ICD10-J43.9). Electronically Signed   By: Inez Catalina M.D.   On: 07/21/2017 14:55   US Abdomen Limited  Result Date: 07/21/2017 CLINICAL DATA:  62 year old with history of hep. C and cirrhosis. Patient presents with shortness of breath. Evaluate for ascites. EXAM: LIMITED ABDOMEN ULTRASOUND FOR ASCITES TECHNIQUE: Limited ultrasound survey for ascites was performed in all four abdominal quadrants. COMPARISON:  Abdominal CT 09/09/2016 FINDINGS: Trace amount of ascites in the abdomen. Bowel loops surrounding this small amount of fluid. IMPRESSION: Trace ascites.  Not enough fluid for a paracentesis. Electronically Signed   By: Markus Daft M.D.   On: 07/21/2017 16:43   Dg Chest Port 1 View  Result Date: 07/21/2017 CLINICAL DATA:  Respiratory distress EXAM: PORTABLE CHEST 1 VIEW COMPARISON:  07/02/2017 FINDINGS: Worsening bilateral lower lobe airspace opacities, right greater than left, concerning for pneumonia. Heart is normal size. No visible effusions. No acute bony abnormality. Diffuse interstitial prominence again noted, likely chronic interstitial lung disease. IMPRESSION: Focal airspace opacities in both lower lobes, right greater than left, worsening since prior study concerning for pneumonia superimposed on chronic interstitial lung disease. Electronically Signed   By: Rolm Baptise M.D.   On: 07/21/2017 13:11    EKG:   Orders placed or performed during the hospital encounter of 07/21/17  . EKG 12-Lead  . EKG 12-Lead  . ED EKG 12-Lead  . ED EKG 12-Lead    ASSESSMENT AND PLAN:  Acute respiratory failure due to combination of pneumonia, ascites in the context of cirrhosis.  Continue antibiotics, added lactulose.  Patient clinically improving. 2.  History of hep C, patient takes Harvoni from Dr. Percell [place] office.  According to sister today the last dose of Harvoni. 3.  History of liver cirrhosis due to  hepatitis C, patient has  hepatic encephalopathy when he came with ammonia level around 120.  But today he is alert.  Continue lactulose 30 g p.o. twice daily, I told the patient that he cannot skip lactulose.  And I told the patient sister also over the phone about that.  Patient ammonia level is 53.  Continue lactulose.  He is alert, awake, oriented, explained to the patient that the should take lactulose every day. 4.  History of COPD: No wheezing. 5.  Chronic pain syndrome, continue methadone 20 mg every 8 hours, Vicodin 1 to 2 tablets every 4 hours, Xanax 1 mg p.o. 3 times daily as needed #6 hepatitis C,, ascites: Patient has a small amount of ascites that may be causing him swelling.  Started on Lasix.  Echo showed EF more than 65%.   #6 deconditioning: Physical therapy consult.   All the records are reviewed and case discussed with Care Management/Social Workerr. Management plans discussed with the patient, family and they are in agreement.  CODE STATUS: Full code  TOTAL TIME TAKING CARE OF THIS PATIENT: 38 minutes.   Possible discharge next 1 to 2-day.   Spoke with patient sister over the phone for about 50 minutes.  More than 50% of time spent in counseling, coordination of care.   Epifanio Lesches M.D on 07/23/2017 at 10:43 AM  Between 7am to 6pm - Pager - [phone]  After 6pm go to [email or web address] - password [email or web address] [facility]  Office  [phone]  CC: Primary care physician; Theotis Burrow, MD   Note: This dictation was prepared with Dragon dictation along with smaller phrase technology. Any transcriptional errors that result from this process are unintentional.

## 2017-07-24 LAB — GLUCOSE, CAPILLARY
GLUCOSE-CAPILLARY: 108 mg/dL — AB (ref 65–99)
GLUCOSE-CAPILLARY: 144 mg/dL — AB (ref 65–99)
Glucose-Capillary: 278 mg/dL — ABNORMAL HIGH (ref 65–99)
Glucose-Capillary: 224 mg/dL — ABNORMAL HIGH (ref 65–99)

## 2017-07-24 LAB — AMMONIA: AMMONIA: 32 umol/L (ref 9–35)

## 2017-07-24 NOTE — Clinical Social Work Note (Signed)
CSW spoke with patient's sister Alycia Patten [phone]. Sister states that she is concerned about patient returning home with mother. Sister states that patient's mother will be moving to an assisted living facility soon and does not know what patient will do at that point. CSW explained to sister that she had talked with patient today regarding this concern and about Assisted living for him. CSW explained that patient is adamantly refusing to even talk about assisted living and states that he will not move. CSW explained that because patient is alert and oriented and able to make decisions we can not force him into a facility. Patient's sister states she understand and will continue to work with patient once he gets home. Sister thanked CSW for information and for trying to talk with patient about this concern.   Annamaria Boots MSW, [place] [phone]

## 2017-07-24 NOTE — Clinical Social Work Note (Signed)
Clinical Social Work Assessment  Patient Details  Name: [name] MRN: [id number] Date of Birth: 05-21-1955  Date of referral:  07/24/17               Reason for consult:  Facility Placement                Permission sought to share information with:  Case Manager, Customer service manager, Family Supports Permission granted to share information::  Yes, Verbal Permission Granted  Name::        Agency::     Relationship::     Contact Information:     Housing/Transportation Living arrangements for the past 2 months:  Single Family Home Source of Information:  Patient Patient Interpreter Needed:  None Criminal Activity/Legal Involvement Pertinent to Current Situation/Hospitalization:  No - Comment as needed Significant Relationships:  Parents, Siblings Lives with:  Parents Do you feel safe going back to the place where you live?  Yes Need for family participation in patient care:  No (Coment)  Care giving concerns:  Patient lives with mother in [place].    Social Worker assessment / plan:  CSW notified by MD that patient's family would like patient to go to Assisted Living facility. CSW met with patient. He is alert and oriented x4. CSW explained role and that CSW was there to look into assisted living facility (ALF) placement. Patient stated that he did not want to look into ALF placement and would be returning home with his mother. Patient states that he is able to move and walk on his own and he helps his mother at home. Patient also stated that he still drives and does not need to be in a facility. Patient reports that his sister thinks he is too stressful on his mother and that's why they want to look at ALF. Patient asked CSW not to look into placement for him and to notify his family that he would not be going. CSW called patient's mother [name] [phone] and left a voicemail. CSW also contacted patient's niece [name] [phone] and left a voicemail.  Patient is alert and oriented and able to make his own decisions. Since he does not want to pursue ALF, CSW will sign off. Please re consult if other needs arise.   Employment status:  Unemployed Forensic scientist:  Medicaid In [place] PT Recommendations:  Not assessed at this time Information / Referral to community resources:     Patient/Family's Response to care:  Patient is accepting of his condition and plans to return home   Patient/Family's Understanding of and Emotional Response to Diagnosis, Current Treatment, and Prognosis:  Family wants to seek ALF placement for patient but he is not willing to go.   Emotional Assessment Appearance:  Appears stated age Attitude/Demeanor/Rapport:  Charismatic Affect (typically observed):  Accepting, Restless Orientation:  Oriented to Self, Oriented to Place, Oriented to  Time, Oriented to Situation Alcohol / Substance use:  Not Applicable Psych involvement (Current and /or in the community):  No (Comment)  Discharge Needs  Concerns to be addressed:  Discharge Planning Concerns Readmission within the last 30 days:  No Current discharge risk:  None Barriers to Discharge:  Continued Medical Work up   [employer], [place] 07/24/2017, 11:11 AM

## 2017-07-24 NOTE — Progress Notes (Signed)
[place] [place] NAME: [name]    MR#:  [id number]  DATE OF BIRTH:  June 20, 1955  SUBJECTIVE: he says he is doing little better than yesterday.  CHIEF COMPLAINT:   Chief Complaint  Patient presents with  . Respiratory Distress   HE Mentioned that he is not taking lactulose regularly secondary to diarrhea.    REVIEW OF SYSTEMS:   ROS CONSTITUTIONAL: No fever, fatigue or weakness.  EYES: No blurred or double vision.  EARS, NOSE, AND THROAT: No tinnitus or ear pain.  RESPIRATORY:CARDIOVASCULAR: No chest pain, orthopnea, edema.  GASTROINTESTINAL: No nausea, vomiting, diarrhea .  Complains of slight abdominal pain. GENITOURINARY: No dysuria, hematuria.  ENDOCRINE: No polyuria, nocturia,  HEMATOLOGY: No anemia, easy bruising or bleeding SKIN: No rash or lesion. MUSCULOSKELETAL: No joint pain or arthritis.   NEUROLOGIC: No tingling, numbness, weakness.  PSYCHIATRY: No anxiety or depression.   DRUG ALLERGIES:  No Known Allergies  VITALS:  Blood pressure (!) 175/89, pulse 78, temperature 97.8 F (36.6 C), temperature source Oral, resp. rate 18, height 5\' 10"  (1.778 m), weight 71.8 kg (158 lb 4.8 oz), SpO2 96 %.  PHYSICAL EXAMINATION:  GENERAL:  62 y.o.-year-old patient lying in the bed with no acute distress.  EYES: Pupils equal, round, reactive to light and accommodation. No scleral icterus. Extraocular muscles intact.  HEENT: Head atraumatic, normocephalic. Oropharynx and nasopharynx clear.  NECK:  Supple, no jugular venous distention. No thyroid enlargement, no tenderness.  LUNGS: Normal breath sounds bilaterally, no wheezing, rales,rhonchi or crepitation. No use of accessory muscles of respiration.  CARDIOVASCULAR: S1, S2 normal. No murmurs, rubs, or gallops.  ABDOMEN: Slightly distended.  No fluid thrill.  Bowel sounds present. EXTREMITIES: No pedal edema, cyanosis, or clubbing.  NEUROLOGIC: Cranial nerves II  through XII are intact. Muscle strength 5/5 in all extremities. Sensation intact. Gait not checked.  PSYCHIATRIC: The patient is alert and oriented x 3.  SKIN: No obvious rash, lesion, or ulcer.    LABORATORY PANEL:   CBC Recent Labs  Lab 07/22/17 0330  WBC 8.2  HGB 11.8*  HCT 35.3*  PLT 68*   ------------------------------------------------------------------------------------------------------------------  Chemistries  Recent Labs  Lab 07/21/17 1332 07/22/17 0330  NA 137 140  K 4.4 4.0  CL 104 108  CO2 29 27  GLUCOSE 309* 355*  BUN 15 19  CREATININE 0.75 0.74  CALCIUM 8.5* 8.0*  AST 50*  --   ALT 49  --   ALKPHOS 120  --   BILITOT 1.3*  --    ------------------------------------------------------------------------------------------------------------------  Cardiac Enzymes Recent Labs  Lab 07/21/17 1332  TROPONINI 0.03*   ------------------------------------------------------------------------------------------------------------------  RADIOLOGY:  Dg Chest Port 1 View  Result Date: 07/23/2017 CLINICAL DATA:  Respiratory distress and shortness of breath. EXAM: PORTABLE CHEST 1 VIEW COMPARISON:  07/21/2017 chest CT and chest radiograph. Prior studies FINDINGS: The cardiomediastinal silhouette is unremarkable. Mild pulmonary vascular congestion identified. Resolved bibasilar opacities/atelectasis noted. Emphysema again noted. No evidence of pleural effusion or pneumothorax. No acute bony abnormalities are present. IMPRESSION: 1. Resolved bibasilar opacities/atelectasis. 2. Emphysema and mild pulmonary vascular congestion. Electronically Signed   By: Margarette Canada M.D.   On: 07/23/2017 11:32    EKG:   Orders placed or performed during the hospital encounter of 07/21/17  . EKG 12-Lead  . EKG 12-Lead  . ED EKG 12-Lead  . ED EKG 12-Lead    ASSESSMENT AND PLAN:  Acute respiratory failure due to combination of pneumonia,  ascites in the context of cirrhosis.   Continue antibiotics, added lactulose.  Patient clinically improving. 2.  History of hep C, patient takes Harvoni from Dr. Percell [place] office.  According to sister today the last dose of Harvoni. 3.  History of liver cirrhosis due to hepatitis C, patient has hepatic encephalopathy when he came with ammonia level around 120.  But today he is alert.  Continue lactulose 30 g p.o. twice daily, I told the patient that he cannot skip lactulose.  And I told the patient sister also over the phone about that.  Patient ammonia level is 53.  Continue lactulose.  He is alert, awake, oriented, explained to the patient that the should take lactulose every day. 4.  History of COPD: No wheezing. 5.  Chronic pain syndrome, continue methadone 20 mg every 8 hours, Vicodin 1 to 2 tablets every 4 hours, Xanax 1 mg p.o. 3 times daily as needed #6 hepatitis C,, ascites: Patient has a small amount of ascites that may be causing him swelling.  Started on Lasix.  Echo showed EF more than 65%.   #6 deconditioning: Physical therapy consult, patient refused phthisical therapy yesterday. 7.  Likely discharge home with home health tomorrow with the lactulose, Lasix..   All the records are reviewed and case discussed with Care Management/Social Workerr. Management plans discussed with the patient, family and they are in agreement.  CODE STATUS: Full code  TOTAL TIME TAKING CARE OF THIS PATIENT: 38 minutes.   Possible discharge next 1 to 2-day.   Spoke with patient sister over the phone for about 50 minutes.  More than 50% of time spent in counseling, coordination of care.   Epifanio Lesches M.D on 07/24/2017 at 2:34 PM  Between 7am to 6pm - Pager - [phone]  After 6pm go to [email or web address] - password [email or web address]  Office  [phone]  CC: Primary care physician; Theotis Burrow, MD   Note: This dictation was prepared with Dragon dictation along with smaller phrase technology.  Any transcriptional errors that result from this process are unintentional.

## 2017-07-25 LAB — GLUCOSE, CAPILLARY
GLUCOSE-CAPILLARY: 226 mg/dL — AB (ref 65–99)
Glucose-Capillary: 123 mg/dL — ABNORMAL HIGH (ref 65–99)
Glucose-Capillary: 239 mg/dL — ABNORMAL HIGH (ref 65–99)
Glucose-Capillary: 246 mg/dL — ABNORMAL HIGH (ref 65–99)

## 2017-07-25 LAB — AMMONIA: Ammonia: 35 umol/L (ref 9–35)

## 2017-07-25 MED ORDER — PREDNISONE 20 MG PO TABS
40.0000 mg | ORAL_TABLET | Freq: Every day | ORAL | Status: DC
  Administered 2017-07-26: 08:00:00 40 mg via ORAL
  Filled 2017-07-25: qty 2

## 2017-07-25 MED ORDER — HYDRALAZINE HCL 25 MG PO TABS
25.0000 mg | ORAL_TABLET | Freq: Three times a day (TID) | ORAL | Status: DC
  Administered 2017-07-25 – 2017-07-26 (×3): 25 mg via ORAL
  Filled 2017-07-25 (×6): qty 1

## 2017-07-25 MED ORDER — PREDNISONE 50 MG PO TABS: 50.0000 mg | ORAL_TABLET | Freq: Every day | ORAL | Status: DC

## 2017-07-25 NOTE — Progress Notes (Signed)
[place] at [place] NAME: [name]    MR#:  [id number]  DATE OF BIRTH:  1955-11-03  Patient feels better than yesterday and wants to go home tomorrow with home health.  Some cough and phlegm reported.  CHIEF COMPLAINT:   Chief Complaint  Patient presents with  . Respiratory Distress   HE Mentioned that he is not taking lactulose regularly secondary to diarrhea.    REVIEW OF SYSTEMS:   ROS CONSTITUTIONAL: No fever, fatigue or weakness.  EYES: No blurred or double vision.  EARS, NOSE, AND THROAT: No tinnitus or ear pain.  RESPIRATORY:CARDIOVASCULAR: No chest pain, orthopnea, edema.  GASTROINTESTINAL: No nausea, vomiting, diarrhea .  Complains of slight abdominal pain. GENITOURINARY: No dysuria, hematuria.  ENDOCRINE: No polyuria, nocturia,  HEMATOLOGY: No anemia, easy bruising or bleeding SKIN: No rash or lesion. MUSCULOSKELETAL: No joint pain or arthritis.   NEUROLOGIC: No tingling, numbness, weakness.  PSYCHIATRY: No anxiety or depression.   DRUG ALLERGIES:  No Known Allergies  VITALS:  Blood pressure (!) 173/86, pulse 74, temperature (!) 97.5 F (36.4 C), temperature source Oral, resp. rate 18, height 5\' 10"  (1.778 m), weight 70.6 kg (155 lb 11.2 oz), SpO2 97 %.  PHYSICAL EXAMINATION:  GENERAL:  62 y.o.-year-old patient lying in the bed with no acute distress.  EYES: Pupils equal, round, reactive to light and accommodation. No scleral icterus. Extraocular muscles intact.  HEENT: Head atraumatic, normocephalic. Oropharynx and nasopharynx clear.  NECK:  Supple, no jugular venous distention. No thyroid enlargement, no tenderness.  LUNGS: Normal breath sounds bilaterally, no wheezing, rales,rhonchi or crepitation. No use of accessory muscles of respiration.  CARDIOVASCULAR: S1, S2 normal. No murmurs, rubs, or gallops.  ABDOMEN: Slightly distended.  No fluid thrill.  Bowel sounds present. EXTREMITIES: No pedal edema,  cyanosis, or clubbing.  NEUROLOGIC: Cranial nerves II through XII are intact. Muscle strength 5/5 in all extremities. Sensation intact. Gait not checked.  PSYCHIATRIC: The patient is alert and oriented x 3.  SKIN: No obvious rash, lesion, or ulcer.    LABORATORY PANEL:   CBC Recent Labs  Lab 07/22/17 0330  WBC 8.2  HGB 11.8*  HCT 35.3*  PLT 68*   ------------------------------------------------------------------------------------------------------------------  Chemistries  Recent Labs  Lab 07/21/17 1332 07/22/17 0330  NA 137 140  K 4.4 4.0  CL 104 108  CO2 29 27  GLUCOSE 309* 355*  BUN 15 19  CREATININE 0.75 0.74  CALCIUM 8.5* 8.0*  AST 50*  --   ALT 49  --   ALKPHOS 120  --   BILITOT 1.3*  --    ------------------------------------------------------------------------------------------------------------------  Cardiac Enzymes Recent Labs  Lab 07/21/17 1332  TROPONINI 0.03*   ------------------------------------------------------------------------------------------------------------------  RADIOLOGY:  Dg Chest Port 1 View  Result Date: 07/23/2017 CLINICAL DATA:  Respiratory distress and shortness of breath. EXAM: PORTABLE CHEST 1 VIEW COMPARISON:  07/21/2017 chest CT and chest radiograph. Prior studies FINDINGS: The cardiomediastinal silhouette is unremarkable. Mild pulmonary vascular congestion identified. Resolved bibasilar opacities/atelectasis noted. Emphysema again noted. No evidence of pleural effusion or pneumothorax. No acute bony abnormalities are present. IMPRESSION: 1. Resolved bibasilar opacities/atelectasis. 2. Emphysema and mild pulmonary vascular congestion. Electronically Signed   By: Margarette Canada M.D.   On: 07/23/2017 11:32    EKG:   Orders placed or performed during the hospital encounter of 07/21/17  . EKG 12-Lead  . EKG 12-Lead  . ED EKG 12-Lead  . ED EKG 12-Lead    ASSESSMENT  AND PLAN:  Acute respiratory failure due to combination of  pneumonia, ascites in the context of cirrhosis.  Continue antibiotics, added lactulose.  Patient clinically improving. 2.  History of hep C, patient takes Harvoni from Dr. Percell [place] office.  According to sister today the last dose of Harvoni. 3.  History of liver cirrhosis due to hepatitis C, patient has hepatic encephalopathy when he came with ammonia level around 120.   Ammonia level is down, continue lactulose, patient alert, awake, oriented.  4.  History of COPD: No wheezing.  Wean down the steroids. 5.  Chronic pain syndrome, continue methadone 20 mg every 8 hours, Vicodin 1 to 2 tablets every 4 hours, Xanax 1 mg p.o. 3 times daily as needed  #6. hepatitis C,, ascites: Patient has a small amount of ascites that may be causing him swelling.  Started on Lasix.  Echo showed EF more than 65%.    #6. deconditioning: Physical therapy consult, patient refused phthisical therapy yesterday.   likely discharge home tomorrow with home health.  7.  Hyperglycemia secondary to steroids: Continue sliding scale insulin with coverage. #8. anxiety: Continue Xanax.   All the records are reviewed and case discussed with Care Management/Social Workerr. Management plans discussed with the patient, family and they are in agreement.  CODE STATUS: Full code  TOTAL TIME TAKING CARE OF THIS PATIENT: 38 minutes.   Possible discharge next 1 to 2-day.   Spoke with patient sister over the phone for about 50 minutes.  More than 50% of time spent in counseling, coordination of care.   Epifanio Lesches M.D on 07/25/2017 at 10:29 AM  Between 7am to 6pm - Pager - [phone]  After 6pm go to [email or web address] - password [email or web address] [facility]  Office  [phone]  CC: Primary care physician; Theotis Burrow, MD   Note: This dictation was prepared with Dragon dictation along with smaller phrase technology. Any transcriptional errors that result from this process are  unintentional.

## 2017-07-25 NOTE — Progress Notes (Signed)
Family Meeting Note  Advance Directive:yes  Today a meeting took place with the Patient. Is able to participate in decision making The following clinical team members were present during this meeting:MD Discussed CODE STATUS with patient, patient wants to be full code at this time the following were discussed:Patient's diagnosis: ,   Patient's progosis: Unable to determine and Goals for treatment: Continue present management  Additional follow-up to be provided: Follow  Time spent during discussion:20 minutes  Epifanio Lesches, MD

## 2017-07-25 NOTE — Progress Notes (Signed)
Pharmacy Antibiotic Note  [name] is a 62 y.o. male admitted on 07/21/2017 with sepsis/bilat. pneumonia.  Pharmacy has been consulted for Cefepime dosing. Patient received 2 days of vancomycin dosing 5/13, 14  Patient recently admitted at [facility] 5/3-07/15/17 with HCAP.  Plan: -Cefepime 2 gm IV q8h started 5/13. Dose remains appropriate for renal function. Dr Vianne Bulls would like to continue this therapy for now with plans for a possible discharge tomorrow.  Height: 5\' 10"  (177.8 cm) Weight: 155 lb 11.2 oz (70.6 kg) IBW/kg (Calculated) : 73  Temp (24hrs), Avg:97.9 F (36.6 C), Min:97.5 F (36.4 C), Max:98.2 F (36.8 C)  Recent Labs  Lab 07/21/17 1244 07/21/17 1332 07/21/17 1519 07/22/17 0330  WBC 9.3  --   --  8.2  CREATININE  --  0.75  --  0.74  LATICACIDVEN  --  3.1* 2.7*  --     Estimated Creatinine Clearance: 95.6 mL/min (by C-G formula based on SCr of 0.74 mg/dL).    No Known Allergies  Antimicrobials this admission: Zosyn x 1 in ER 5/13   >>   Vanc 5/13    >>  5/14 Cefepime 5/13 >>  Dose adjustments this admission: none   Microbiology results: BCx: 5/3: NGF BCx: 5/13 pending, MRSA PCR(-) Vanc5/13>>5/14 Cefepime 5/13 >>   Thank you for allowing pharmacy to be a part of this patient's care.  Dallie Piles, PharmD 07/25/2017 12:37 PM

## 2017-07-25 NOTE — Progress Notes (Signed)
Inpatient Diabetes Program Recommendations  AACE/ADA: New Consensus Statement on Inpatient Glycemic Control (2015)  Target Ranges:  Prepandial:   less than 140 mg/dL      Peak postprandial:   less than 180 mg/dL (1-2 hours)      Critically ill patients:  140 - 180 mg/dL  Results for [name], [name] (MRN [id number]) as of 07/25/2017 10:22  Ref. Range 07/24/2017 07:33 07/24/2017 11:45 07/24/2017 16:42 07/24/2017 23:19 07/25/2017 07:14  Glucose-Capillary Latest Ref Range: 65 - 99 mg/dL 108 (H) 144 (H) 278 (H) 224 (H) 123 (H)    Review of Glycemic Control  Diabetes history: DM2 Outpatient Diabetes medications: Metformin 500 mg BID Current orders for Inpatient glycemic control: Novolog 0-9 units TID with meals, Novolog 0-5 units QHS; Prednisone 60 mg daily  Inpatient Diabetes Program Recommendations: Insulin - Meal Coverage: If steroids are continued, please consider ordering Novolog 3 units TID with meals for meal coverage if patient eats at least 50% of meals.  Thanks, Barnie Alderman, RN, MSN, CDE Diabetes Coordinator Inpatient Diabetes Program [phone] (Team Pager from 8am to 5pm)

## 2017-07-26 LAB — CULTURE, BLOOD (ROUTINE X 2)
CULTURE: NO GROWTH
CULTURE: NO GROWTH
SPECIAL REQUESTS: ADEQUATE
SPECIAL REQUESTS: ADEQUATE

## 2017-07-26 LAB — GLUCOSE, CAPILLARY
Glucose-Capillary: 94 mg/dL (ref 65–99)
Glucose-Capillary: 176 mg/dL — ABNORMAL HIGH (ref 65–99)

## 2017-07-26 LAB — AMMONIA: Ammonia: 35 umol/L (ref 9–35)

## 2017-07-26 MED ORDER — FUROSEMIDE 20 MG PO TABS: 20.0000 mg | ORAL_TABLET | Freq: Every day | ORAL | 0 refills | Status: AC

## 2017-07-26 MED ORDER — PREDNISONE 10 MG (21) PO TBPK: ORAL_TABLET | ORAL | 0 refills | Status: DC

## 2017-07-26 MED ORDER — METFORMIN HCL 500 MG PO TABS: 500.0000 mg | ORAL_TABLET | Freq: Two times a day (BID) | ORAL | 0 refills | Status: DC

## 2017-07-26 MED ORDER — AMOXICILLIN-POT CLAVULANATE 875-125 MG PO TABS: 1.0000 | ORAL_TABLET | Freq: Two times a day (BID) | ORAL | 0 refills | Status: DC

## 2017-07-26 MED ORDER — HYDRALAZINE HCL 25 MG PO TABS: 25.0000 mg | ORAL_TABLET | Freq: Three times a day (TID) | ORAL | 0 refills | Status: DC

## 2017-07-26 MED ORDER — AMOXICILLIN-POT CLAVULANATE 875-125 MG PO TABS: 1.0000 | ORAL_TABLET | Freq: Two times a day (BID) | ORAL | 0 refills | Status: AC

## 2017-07-26 NOTE — Care Management (Signed)
RNCM spoke with patient regarding home health recommendation however patient declined services.  He said he was better and walking just fine. He denies DME needs. He said his friend Shanon Brow would be picking him up soon.  He states he has a PCP and able to get medications without difficulty.  Patient has liver disease which may account for his frequent presentations to hospital.

## 2017-07-26 NOTE — Discharge Summary (Signed)
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 evidence of portal hypertension 5. Gallstones Electronically Signed   By: Donavan Foil M.D.   On: 07/11/2017 22:54    Micro Results     Recent Results (from the past 240 hour(s))  Blood Culture (routine x 2)     Status: None   Collection Time: 07/21/17 12:44 PM  Result Value Ref Range Status   Specimen Description BLOOD LEFT FOREARM  Final   Special Requests   Final    BOTTLES DRAWN AEROBIC AND ANAEROBIC Blood Culture adequate volume   Culture   Final    NO GROWTH 5 DAYS Performed at [facility], [address] [place], [place] [address]    Report Status 07/26/2017 FINAL  Final  Blood Culture (routine x 2)     Status: None   Collection Time: 07/21/17 12:49 PM  Result Value Ref Range Status   Specimen Description BLOOD RIGHT ARM  Final   Special Requests   Final    BOTTLES DRAWN AEROBIC AND ANAEROBIC Blood Culture adequate volume   Culture    Final    NO GROWTH 5 DAYS Performed at [facility], [address] [place], [place] [address]    Report Status 07/26/2017 FINAL  Final       Today   Subjective:   [name] today no shortness of breath, stable for discharge.  Objective:   Blood pressure (!) 153/72, pulse 88, temperature 97.6 F (36.4 C), temperature source Oral, resp. rate (!) 21, height 5\' 10"  (1.778 m), weight 70.9 kg (156 lb 4.8 oz), SpO2 96 %.   Intake/Output Summary (Last 24 hours) at 07/26/2017 0913 Last data filed at 07/25/2017 1806 Gross per 24 hour  Intake 480 ml  Output 480 ml  Net 0 ml    Exam Awake Alert, Oriented x 3, No new F.N deficits, Normal affect [place].AT,PERRAL Supple Neck,No JVD, No cervical lymphadenopathy appriciated.  Symmetrical Chest wall movement, Good air movement bilaterally, CTAB RRR,No Gallops,Rubs or new Murmurs, No Parasternal Heave +ve B.Sounds, Abd Soft, Non tender, No organomegaly appriciated, No rebound -guarding or rigidity. No Cyanosis, Clubbing or edema, No new Rash or bruise  Data Review   CBC w Diff:  Lab Results  Component Value Date   WBC 8.2 07/22/2017   HGB 11.8 (L) 07/22/2017   HGB  13.0 06/14/2014   HCT 35.3 (L) 07/22/2017   HCT 39.1 (L) 06/14/2014   PLT 68 (L) 07/22/2017   PLT 112 (L) 06/14/2014   LYMPHOPCT 16 07/21/2017   LYMPHOPCT 12.1 06/14/2014   MONOPCT 9 07/21/2017   MONOPCT 1.6 06/14/2014   EOSPCT 2 07/21/2017   EOSPCT 0.0 06/14/2014   BASOPCT 1 07/21/2017   BASOPCT 0.2 06/14/2014    CMP:  Lab Results  Component Value Date   NA 140 07/22/2017   NA 136 06/14/2014   K 4.0 07/22/2017   K 3.3 (L) 06/14/2014   CL 108 07/22/2017   CL 102 06/14/2014   CO2 27 07/22/2017   CO2 25 06/14/2014   BUN 19 07/22/2017   BUN 29 (H) 06/14/2014   CREATININE 0.74 07/22/2017   CREATININE 0.80 06/14/2014   PROT 6.1 (L) 07/21/2017   PROT 6.2 (L) 06/14/2014   ALBUMIN 3.0 (L) 07/21/2017   ALBUMIN 2.6 (L) 06/14/2014   BILITOT 1.3  (H) 07/21/2017   BILITOT 0.6 06/14/2014   ALKPHOS 120 07/21/2017   ALKPHOS 105 06/14/2014   AST 50 (H) 07/21/2017   AST 107 (H) 06/14/2014   ALT 49 07/21/2017   ALT 137 (H) 06/14/2014  .   Total Time in preparing paper work, data evaluation and todays exam - 35 minutes  Epifanio Lesches M.D on 07/26/2017 at 9:13 AM    Note: This dictation was prepared with Dragon dictation along with smaller phrase technology. Any transcriptional errors that result from this process are unintentional.

## 2017-08-03 ENCOUNTER — Emergency Department: Payer: Medicaid Other

## 2017-08-03 ENCOUNTER — Encounter: Payer: Self-pay | Admitting: Emergency Medicine

## 2017-08-03 ENCOUNTER — Inpatient Hospital Stay
Admission: EM | Admit: 2017-08-03 | Discharge: 2017-08-09 | DRG: 393 | Disposition: A | Discharge status: Home Health | Payer: Medicaid Other | Attending: Internal Medicine | Admitting: Internal Medicine

## 2017-08-03 DIAGNOSIS — Z7984 Long term (current) use of oral hypoglycemic drugs: Secondary | ICD-10-CM

## 2017-08-03 DIAGNOSIS — Z8042 Family history of malignant neoplasm of prostate: Secondary | ICD-10-CM

## 2017-08-03 DIAGNOSIS — K631 Perforation of intestine (nontraumatic): Secondary | ICD-10-CM | POA: Diagnosis present

## 2017-08-03 DIAGNOSIS — K5900 Constipation, unspecified: Secondary | ICD-10-CM | POA: Diagnosis present

## 2017-08-03 DIAGNOSIS — K219 Gastro-esophageal reflux disease without esophagitis: Secondary | ICD-10-CM | POA: Diagnosis present

## 2017-08-03 DIAGNOSIS — J449 Chronic obstructive pulmonary disease, unspecified: Secondary | ICD-10-CM | POA: Diagnosis present

## 2017-08-03 DIAGNOSIS — Z8701 Personal history of pneumonia (recurrent): Secondary | ICD-10-CM | POA: Diagnosis not present

## 2017-08-03 DIAGNOSIS — R64 Cachexia: Secondary | ICD-10-CM | POA: Diagnosis present

## 2017-08-03 DIAGNOSIS — Z6821 Body mass index (BMI) 21.0-21.9, adult: Secondary | ICD-10-CM | POA: Diagnosis not present

## 2017-08-03 DIAGNOSIS — D6959 Other secondary thrombocytopenia: Secondary | ICD-10-CM | POA: Diagnosis present

## 2017-08-03 DIAGNOSIS — N400 Enlarged prostate without lower urinary tract symptoms: Secondary | ICD-10-CM | POA: Diagnosis present

## 2017-08-03 DIAGNOSIS — F411 Generalized anxiety disorder: Secondary | ICD-10-CM | POA: Diagnosis present

## 2017-08-03 DIAGNOSIS — K7460 Unspecified cirrhosis of liver: Secondary | ICD-10-CM | POA: Diagnosis not present

## 2017-08-03 DIAGNOSIS — Z9981 Dependence on supplemental oxygen: Secondary | ICD-10-CM | POA: Diagnosis not present

## 2017-08-03 DIAGNOSIS — I5033 Acute on chronic diastolic (congestive) heart failure: Secondary | ICD-10-CM | POA: Diagnosis not present

## 2017-08-03 DIAGNOSIS — E46 Unspecified protein-calorie malnutrition: Secondary | ICD-10-CM | POA: Diagnosis present

## 2017-08-03 DIAGNOSIS — R188 Other ascites: Secondary | ICD-10-CM | POA: Diagnosis present

## 2017-08-03 DIAGNOSIS — I110 Hypertensive heart disease with heart failure: Secondary | ICD-10-CM | POA: Diagnosis present

## 2017-08-03 DIAGNOSIS — K766 Portal hypertension: Secondary | ICD-10-CM | POA: Diagnosis present

## 2017-08-03 DIAGNOSIS — Z87891 Personal history of nicotine dependence: Secondary | ICD-10-CM | POA: Diagnosis not present

## 2017-08-03 DIAGNOSIS — R000 Tachycardia, unspecified: Secondary | ICD-10-CM | POA: Diagnosis present

## 2017-08-03 DIAGNOSIS — B182 Chronic viral hepatitis C: Secondary | ICD-10-CM | POA: Diagnosis present

## 2017-08-03 DIAGNOSIS — D696 Thrombocytopenia, unspecified: Secondary | ICD-10-CM | POA: Insufficient documentation

## 2017-08-03 DIAGNOSIS — Z9841 Cataract extraction status, right eye: Secondary | ICD-10-CM

## 2017-08-03 DIAGNOSIS — M549 Dorsalgia, unspecified: Secondary | ICD-10-CM | POA: Diagnosis present

## 2017-08-03 DIAGNOSIS — K652 Spontaneous bacterial peritonitis: Secondary | ICD-10-CM | POA: Diagnosis present

## 2017-08-03 DIAGNOSIS — G894 Chronic pain syndrome: Secondary | ICD-10-CM | POA: Diagnosis present

## 2017-08-03 DIAGNOSIS — Z7951 Long term (current) use of inhaled steroids: Secondary | ICD-10-CM

## 2017-08-03 DIAGNOSIS — Z9842 Cataract extraction status, left eye: Secondary | ICD-10-CM

## 2017-08-03 DIAGNOSIS — E1165 Type 2 diabetes mellitus with hyperglycemia: Secondary | ICD-10-CM | POA: Diagnosis present

## 2017-08-03 DIAGNOSIS — Z79891 Long term (current) use of opiate analgesic: Secondary | ICD-10-CM | POA: Diagnosis not present

## 2017-08-03 DIAGNOSIS — Z961 Presence of intraocular lens: Secondary | ICD-10-CM | POA: Diagnosis present

## 2017-08-03 LAB — COMPREHENSIVE METABOLIC PANEL
ALK PHOS: 110 U/L (ref 38–126)
ALT: 135 U/L — AB (ref 17–63)
AST: 134 U/L — ABNORMAL HIGH (ref 15–41)
Albumin: 3.3 g/dL — ABNORMAL LOW (ref 3.5–5.0)
Anion gap: 10 (ref 5–15)
BUN: 31 mg/dL — ABNORMAL HIGH (ref 6–20)
CALCIUM: 9.4 mg/dL (ref 8.9–10.3)
CO2: 23 mmol/L (ref 22–32)
CREATININE: 0.85 mg/dL (ref 0.61–1.24)
Chloride: 100 mmol/L — ABNORMAL LOW (ref 101–111)
GFR calc non Af Amer: >60 mL/min (ref >60)
Glucose, Bld: 238 mg/dL — ABNORMAL HIGH (ref 65–99)
Potassium: 4 mmol/L (ref 3.5–5.1)
SODIUM: 133 mmol/L — AB (ref 135–145)
Total Bilirubin: 1.6 mg/dL — ABNORMAL HIGH (ref 0.3–1.2)
Total Protein: 6.5 g/dL (ref 6.5–8.1)

## 2017-08-03 LAB — CBC
HEMATOCRIT: 38.9 % — AB (ref 40.0–52.0)
HEMOGLOBIN: 13.2 g/dL (ref 13.0–18.0)
MCH: 34.5 pg — AB (ref 26.0–34.0)
MCHC: 34 g/dL (ref 32.0–36.0)
MCV: 101.5 fL — AB (ref 80.0–100.0)
Platelets: 52 10*3/uL — ABNORMAL LOW (ref 150–440)
RBC: 3.83 MIL/uL — AB (ref 4.40–5.90)
RDW: 14.3 % (ref 11.5–14.5)
WBC: 9.2 10*3/uL (ref 3.8–10.6)

## 2017-08-03 LAB — LIPASE, BLOOD: Lipase: 37 U/L (ref 11–51)

## 2017-08-03 LAB — TROPONIN I: TROPONIN I: 0.04 ng/mL — AB (ref <0.03)

## 2017-08-03 MED ORDER — SODIUM CHLORIDE 0.9 % IV BOLUS
1000.0000 mL | Freq: Once | INTRAVENOUS | Status: AC
  Administered 2017-08-03: 1000 mL via INTRAVENOUS

## 2017-08-03 MED ORDER — MORPHINE SULFATE (PF) 4 MG/ML IV SOLN
4.0000 mg | Freq: Once | INTRAVENOUS | Status: AC
  Administered 2017-08-03: 4 mg via INTRAVENOUS
  Filled 2017-08-03: qty 1

## 2017-08-03 MED ORDER — PIPERACILLIN-TAZOBACTAM 3.375 G IVPB 30 MIN
3.3750 g | Freq: Once | INTRAVENOUS | Status: AC
  Administered 2017-08-03: 3.375 g via INTRAVENOUS
  Filled 2017-08-03: qty 50

## 2017-08-03 MED ORDER — ONDANSETRON HCL 4 MG/2ML IJ SOLN
4.0000 mg | Freq: Once | INTRAMUSCULAR | Status: AC
  Administered 2017-08-03: 4 mg via INTRAVENOUS
  Filled 2017-08-03: qty 2

## 2017-08-03 NOTE — ED Notes (Signed)
Pt given sips of water; attempted to speak with pt again about admitting diagnosis and what's going in his abd; pt most concerned about his nose bleeding like it was last time "that's not normal"; pt is appreciative of MD speaking with his sister

## 2017-08-03 NOTE — ED Notes (Signed)
Patient transported to CT via stretcher with tech, [employer]
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
## 2017-08-03 NOTE — ED Triage Notes (Signed)
Patient with complaint of generalized abdominal pain, decreased urinary output and constipation times two days. Patient states that he was seen here last Saturday for the same.

## 2017-08-03 NOTE — ED Notes (Addendum)
Sat to speak with pt for a few minutes; he says he was admitted about 2 months ago for pneumonia and then just discharged yesterday after staying 5 days; pt says when he was in the hospital in this past week, he was voiding normally; did not have a catheter; but has had to have a catheter in the past; says he lives with his mother and they have someone who comes to the house a few times a week to bathe her; he does all the cooking; pt adds about 3 years ago, in a 12 month period, he lost his father, one brother to COPD and then found another brother who had killed himself by hanging; pt became tearful; says he has a therapist that has been helping him, he sees her about once a month; he takes his prescribed medication for anxiety/depression which seems to help him greatly; pt very pleasant; talking in complete coherent sentences;

## 2017-08-03 NOTE — ED Notes (Signed)
Dr Burt Knack in to speak with pt

## 2017-08-03 NOTE — ED Notes (Signed)
Pt's sister called to check on him; phone given to pt briefly who handed the phone back to me; spoke with pt's sister upon his request; she understands we don't have an lab results at this time and MD is waiting for those before ordering any other tests; she will call back in 60-90 minutes; pt gave this nurse permission to speak with his sister
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
## 2017-08-03 NOTE — ED Notes (Signed)
At pt's bedside speaking with him regarding his discussion with ER provider and consulting surgeon; pt is having difficulty understanding exactly what's going on; says he isn't educated as he's never finished even the 8th grade so he didn't understand what was being explained to him; would like someone to call his sister, Marlowe Kays; pt gave this RN his mother's number to call and get his sister's number; as I was leaving the room my ASCOM started to ring-Connie was calling to check on her brother; phone taken to Dr Kerman Passey at pt's consent so she could speak with MD;

## 2017-08-03 NOTE — ED Notes (Signed)
Troponin 0.04 reported to Dr Kerman Passey; acknowledged

## 2017-08-03 NOTE — ED Notes (Signed)
Admitting MD in to speak with pt

## 2017-08-04 ENCOUNTER — Other Ambulatory Visit: Payer: Self-pay

## 2017-08-04 DIAGNOSIS — K652 Spontaneous bacterial peritonitis: Secondary | ICD-10-CM

## 2017-08-04 LAB — GLUCOSE, CAPILLARY
GLUCOSE-CAPILLARY: 137 mg/dL — AB (ref 65–99)
GLUCOSE-CAPILLARY: 107 mg/dL — AB (ref 65–99)
GLUCOSE-CAPILLARY: 142 mg/dL — AB (ref 65–99)
Glucose-Capillary: 106 mg/dL — ABNORMAL HIGH (ref 65–99)

## 2017-08-04 LAB — BASIC METABOLIC PANEL
Anion gap: 8 (ref 5–15)
BUN: 27 mg/dL — AB (ref 6–20)
CO2: 24 mmol/L (ref 22–32)
CREATININE: 0.73 mg/dL (ref 0.61–1.24)
Calcium: 8.5 mg/dL — ABNORMAL LOW (ref 8.9–10.3)
Chloride: 105 mmol/L (ref 101–111)
Glucose, Bld: 151 mg/dL — ABNORMAL HIGH (ref 65–99)
Potassium: 4.5 mmol/L (ref 3.5–5.1)
SODIUM: 137 mmol/L (ref 135–145)

## 2017-08-04 LAB — CBC
HCT: 36.4 % — ABNORMAL LOW (ref 40.0–52.0)
Hemoglobin: 12.4 g/dL — ABNORMAL LOW (ref 13.0–18.0)
MCH: 34.5 pg — ABNORMAL HIGH (ref 26.0–34.0)
MCHC: 34.1 g/dL (ref 32.0–36.0)
MCV: 101.1 fL — ABNORMAL HIGH (ref 80.0–100.0)
PLATELETS: 44 10*3/uL — AB (ref 150–440)
RBC: 3.6 MIL/uL — AB (ref 4.40–5.90)
RDW: 14.3 % (ref 11.5–14.5)
WBC: 6.9 10*3/uL (ref 3.8–10.6)

## 2017-08-04 LAB — TROPONIN I: TROPONIN I: 0.04 ng/mL — AB (ref <0.03)

## 2017-08-04 MED ORDER — ONDANSETRON HCL 4 MG PO TABS: 4.0000 mg | ORAL_TABLET | Freq: Four times a day (QID) | ORAL | Status: DC | PRN

## 2017-08-04 MED ORDER — ALBUTEROL SULFATE (2.5 MG/3ML) 0.083% IN NEBU
2.5000 mg | INHALATION_SOLUTION | RESPIRATORY_TRACT | Status: DC | PRN
Start: 2017-08-04 — End: 2017-08-09

## 2017-08-04 MED ORDER — ALFUZOSIN HCL ER 10 MG PO TB24
10.0000 mg | ORAL_TABLET | Freq: Two times a day (BID) | ORAL | Status: DC
  Administered 2017-08-04 – 2017-08-09 (×11): 10 mg via ORAL
  Filled 2017-08-04 (×13): qty 1

## 2017-08-04 MED ORDER — ACETAMINOPHEN 325 MG PO TABS: 650.0000 mg | ORAL_TABLET | Freq: Four times a day (QID) | ORAL | Status: DC | PRN

## 2017-08-04 MED ORDER — METHADONE HCL 10 MG PO TABS
20.0000 mg | ORAL_TABLET | Freq: Three times a day (TID) | ORAL | Status: DC
  Administered 2017-08-04 – 2017-08-09 (×16): 20 mg via ORAL
  Filled 2017-08-04 (×16): qty 2

## 2017-08-04 MED ORDER — HYDROCODONE-ACETAMINOPHEN 5-325 MG PO TABS
1.0000 | ORAL_TABLET | ORAL | Status: DC | PRN
  Administered 2017-08-04 – 2017-08-05 (×3): 2 via ORAL
  Filled 2017-08-04 (×3): qty 2

## 2017-08-04 MED ORDER — HYDRALAZINE HCL 50 MG PO TABS
25.0000 mg | ORAL_TABLET | Freq: Three times a day (TID) | ORAL | Status: DC
  Administered 2017-08-04 – 2017-08-09 (×15): 25 mg via ORAL
  Filled 2017-08-04 (×15): qty 1

## 2017-08-04 MED ORDER — DOCUSATE SODIUM 100 MG PO CAPS
100.0000 mg | ORAL_CAPSULE | Freq: Two times a day (BID) | ORAL | Status: DC
  Administered 2017-08-04 – 2017-08-09 (×11): 100 mg via ORAL
  Filled 2017-08-04 (×11): qty 1

## 2017-08-04 MED ORDER — MOMETASONE FURO-FORMOTEROL FUM 200-5 MCG/ACT IN AERO
2.0000 | INHALATION_SPRAY | Freq: Two times a day (BID) | RESPIRATORY_TRACT | Status: DC
  Administered 2017-08-04 – 2017-08-09 (×11): 2 via RESPIRATORY_TRACT
  Filled 2017-08-04: qty 8.8

## 2017-08-04 MED ORDER — SOFOSBUVIR-VELPATASVIR 400-100 MG PO TABS
1.0000 | ORAL_TABLET | Freq: Every day | ORAL | Status: DC
  Administered 2017-08-04 – 2017-08-08 (×5): 1 via ORAL
  Filled 2017-08-04 (×4): qty 1

## 2017-08-04 MED ORDER — CALCIUM CARBONATE ANTACID 500 MG PO CHEW
1.0000 | CHEWABLE_TABLET | Freq: Three times a day (TID) | ORAL | Status: DC
  Administered 2017-08-04 – 2017-08-09 (×14): 200 mg via ORAL
  Filled 2017-08-04 (×14): qty 1

## 2017-08-04 MED ORDER — INSULIN ASPART 100 UNIT/ML ~~LOC~~ SOLN
0.0000 [IU] | Freq: Every day | SUBCUTANEOUS | Status: DC
  Administered 2017-08-08: 2 [IU] via SUBCUTANEOUS
  Filled 2017-08-04: qty 1

## 2017-08-04 MED ORDER — ONDANSETRON HCL 4 MG/2ML IJ SOLN
4.0000 mg | Freq: Four times a day (QID) | INTRAMUSCULAR | Status: DC | PRN
  Administered 2017-08-07: 4 mg via INTRAVENOUS
  Filled 2017-08-04: qty 2

## 2017-08-04 MED ORDER — IPRATROPIUM-ALBUTEROL 0.5-2.5 (3) MG/3ML IN SOLN
3.0000 mL | Freq: Four times a day (QID) | RESPIRATORY_TRACT | Status: DC | PRN
  Administered 2017-08-04: 16:00:00 3 mL via RESPIRATORY_TRACT
  Filled 2017-08-04: qty 3

## 2017-08-04 MED ORDER — INSULIN ASPART 100 UNIT/ML ~~LOC~~ SOLN
0.0000 [IU] | Freq: Three times a day (TID) | SUBCUTANEOUS | Status: DC
  Administered 2017-08-04 – 2017-08-05 (×3): 1 [IU] via SUBCUTANEOUS
  Administered 2017-08-05 – 2017-08-06 (×2): 2 [IU] via SUBCUTANEOUS
  Administered 2017-08-06 (×2): 1 [IU] via SUBCUTANEOUS
  Administered 2017-08-07: 2 [IU] via SUBCUTANEOUS
  Administered 2017-08-08: 1 [IU] via SUBCUTANEOUS
  Administered 2017-08-08: 2 [IU] via SUBCUTANEOUS
  Administered 2017-08-08: 18:00:00 1 [IU] via SUBCUTANEOUS
  Administered 2017-08-09: 08:00:00 2 [IU] via SUBCUTANEOUS
  Filled 2017-08-04 (×12): qty 1

## 2017-08-04 MED ORDER — HEPARIN SODIUM (PORCINE) 5000 UNIT/ML IJ SOLN: 5000.0000 [IU] | Freq: Three times a day (TID) | INTRAMUSCULAR | Status: DC

## 2017-08-04 MED ORDER — BISACODYL 5 MG PO TBEC: 5.0000 mg | DELAYED_RELEASE_TABLET | Freq: Every day | ORAL | Status: DC | PRN

## 2017-08-04 MED ORDER — PIPERACILLIN-TAZOBACTAM 3.375 G IVPB
3.3750 g | Freq: Three times a day (TID) | INTRAVENOUS | Status: DC
  Administered 2017-08-04 – 2017-08-08 (×12): 3.375 g via INTRAVENOUS
  Filled 2017-08-04 (×12): qty 50

## 2017-08-04 MED ORDER — FUROSEMIDE 20 MG PO TABS
20.0000 mg | ORAL_TABLET | Freq: Every day | ORAL | Status: DC
  Administered 2017-08-04: 09:00:00 20 mg via ORAL
  Filled 2017-08-04: qty 1

## 2017-08-04 MED ORDER — TIOTROPIUM BROMIDE MONOHYDRATE 18 MCG IN CAPS
18.0000 ug | ORAL_CAPSULE | Freq: Every day | RESPIRATORY_TRACT | Status: DC
  Administered 2017-08-04 – 2017-08-09 (×6): 18 ug via RESPIRATORY_TRACT
  Filled 2017-08-04: qty 5

## 2017-08-04 MED ORDER — ACETAMINOPHEN 650 MG RE SUPP: 650.0000 mg | Freq: Four times a day (QID) | RECTAL | Status: DC | PRN

## 2017-08-04 MED ORDER — ALPRAZOLAM 1 MG PO TABS
1.0000 mg | ORAL_TABLET | Freq: Three times a day (TID) | ORAL | Status: DC | PRN
Start: 2017-08-04 — End: 2017-08-09
  Administered 2017-08-04 – 2017-08-09 (×12): 1 mg via ORAL
  Filled 2017-08-04 (×12): qty 1

## 2017-08-04 MED ORDER — LACTULOSE 10 GM/15ML PO SOLN
20.0000 g | Freq: Every day | ORAL | Status: DC
  Administered 2017-08-04 – 2017-08-09 (×6): 20 g via ORAL
  Filled 2017-08-04 (×6): qty 30

## 2017-08-04 NOTE — ED Notes (Signed)
Pt's sats had been 93-94%; now sats 90% on room air; pt placed on 2L via [place]

## 2017-08-04 NOTE — Care Management Note (Addendum)
Case Management Note  Patient Details  Name: [name] MRN: [id number] Date of Birth: December 02, 1955  Subjective/Objective:   Admitted to [facility] with the diagnosis of peritonitis. Lives with mother [name] [phone]). Sister is Marline Backbone  [phone]). Prescriptions are filled at [facility]. [place] recommendation 07/24/17. Skilled Nursing in [place] in the past . Home oxygen in the past, (08/2016) not now. Rolling walker, nebulizer,  and cane in the home. Takes care of all basic activities of daily living himself, drives. Good appetite. No halls. Family will transport                Action/Plan: Willing to accept Home Health for skilled nursing at this time, Skilled nursing for monitoring his disease process   Expected Discharge Date:                  Expected Discharge Plan:     In-House Referral:     Discharge planning Services     Post Acute Care Choice:    Choice offered to:     DME Arranged:    DME Agency:     HH Arranged:    [place] Agency:     Status of Service:     If discussed at [employer], dates discussed:    Additional Comments:  Shelbie Ammons, RN MSN CCM Care Management [phone] 08/04/2017, 9:44 AM

## 2017-08-04 NOTE — Progress Notes (Signed)
Patient ID: [name], male   DOB: 07/17/1955, 62 y.o.   MRN: [id number]  [employer] PROGRESS NOTE  Jabri Blancett ZWC:[id number] DOB: 1956-02-18 DOA: 08/03/2017 PCP: Theotis Burrow, MD  HPI/Subjective: Patient with some upper abdominal pain.  No nausea or vomiting.  Has not had a bowel movement in a while.  Not feeling great.  Objective: Vitals:   08/04/17 0107 08/04/17 0502  BP: (!) 171/76 (!) 117/56  Pulse: (!) 120 (!) 118  Resp:  16  Temp: 97.9 F (36.6 C) 98 F (36.7 C)  SpO2: 96% 92%    Filed Weights   08/03/17 2002 08/04/17 0107 08/04/17 0502  Weight: 70.8 kg (156 lb) 70.5 kg (155 lb 8 oz) 71.1 kg (156 lb 11.2 oz)    ROS: Review of Systems  Constitutional: Negative for chills and fever.  Eyes: Negative for blurred vision.  Respiratory: Positive for cough and shortness of breath.   Cardiovascular: Negative for chest pain.  Gastrointestinal: Positive for abdominal pain and constipation. Negative for diarrhea, nausea and vomiting.  Genitourinary: Negative for dysuria.  Musculoskeletal: Negative for joint pain.  Neurological: Negative for dizziness and headaches.   Exam: Physical Exam  Constitutional: He is oriented to person, place, and time.  HENT:  Nose: No mucosal edema.  Mouth/Throat: No oropharyngeal exudate or posterior oropharyngeal edema.  Eyes: Pupils are equal, round, and reactive to light. Conjunctivae, EOM and lids are normal.  Neck: No JVD present. Carotid bruit is not present. No edema present. No thyroid mass and no thyromegaly present.  Cardiovascular: S1 normal and S2 normal. Tachycardia present. Exam reveals no gallop.  No murmur heard. Pulses:      Dorsalis pedis pulses are 2+ on the right side, and 2+ on the left side.  Respiratory: No respiratory distress. He has decreased breath sounds in the right lower field and the left lower field. He has no wheezes. He has rhonchi in the right lower field and the left lower field. He  has no rales.  GI: Soft. Bowel sounds are normal. He exhibits distension. There is tenderness.  Musculoskeletal:       Right ankle: He exhibits no swelling.       Left ankle: He exhibits no swelling.  Lymphadenopathy:    He has no cervical adenopathy.  Neurological: He is alert and oriented to person, place, and time. No cranial nerve deficit.  Skin: Skin is warm. No rash noted. Nails show no clubbing.  Psychiatric: He has a normal mood and affect.      Data Reviewed: Basic Metabolic Panel: Recent Labs  Lab 08/03/17 2027 08/04/17 0439  NA 133* 137  K 4.0 4.5  CL 100* 105  CO2 23 24  GLUCOSE 238* 151*  BUN 31* 27*  CREATININE 0.85 0.73  CALCIUM 9.4 8.5*   Liver Function Tests: Recent Labs  Lab 08/03/17 2027  AST 134*  ALT 135*  ALKPHOS 110  BILITOT 1.6*  PROT 6.5  ALBUMIN 3.3*   Recent Labs  Lab 08/03/17 2027  LIPASE 37   CBC: Recent Labs  Lab 08/03/17 2027 08/04/17 0439  WBC 9.2 6.9  HGB 13.2 12.4*  HCT 38.9* 36.4*  MCV 101.5* 101.1*  PLT 52* 44*   Cardiac Enzymes: Recent Labs  Lab 08/03/17 2027 08/04/17 0439  TROPONINI 0.04* 0.04*   BNP (last 3 results) Recent Labs    09/03/16 1822 07/11/17 2016 07/21/17 1332  BNP 49.0 179.0* 40.0    CBG: Recent Labs  Lab 08/04/17 0917  08/04/17 1151  GLUCAP 137* 107*     Studies: Dg Abdomen Acute W/chest  Result Date: 08/03/2017 CLINICAL DATA:  Abdominal pain and constipation EXAM: DG ABDOMEN ACUTE W/ 1V CHEST COMPARISON:  Chest radiograph Jul 23, 2017; CT abdomen and pelvis September 09, 2016 FINDINGS: PA chest: There is fibrosis in the lower lung zones. There is no appreciable edema or consolidation. Heart size and pulmonary vascularity are normal. There is aortic atherosclerosis. No evident adenopathy. Supine and upright abdomen: There is stool throughout much of the colon. There is no bowel dilatation or air-fluid level to suggest bowel obstruction. No evident free air. There is aortic  atherosclerosis. IMPRESSION: Stool throughout much of colon. No bowel obstruction or free air. There is fibrosis in the lower lung zones. There is no frank edema or consolidation. There is aortic atherosclerosis. Aortic Atherosclerosis (ICD10-I70.0). Electronically Signed   By: Lowella Grip III M.D.   On: 08/03/2017 21:09   Ct Renal Stone Study  Result Date: 08/03/2017 CLINICAL DATA:  Acute onset of generalized abdominal pain. Decreased urinary output. Constipation. EXAM: CT ABDOMEN AND PELVIS WITHOUT CONTRAST TECHNIQUE: Multidetector CT imaging of the abdomen and pelvis was performed following the standard protocol without IV contrast. COMPARISON:  CT of the abdomen and pelvis from 09/09/2016 FINDINGS: Lower chest: Scarring is noted at the lung bases. Scattered coronary artery calcifications are seen. Hepatobiliary: The diffusely nodular contour of the liver is compatible with hepatic cirrhosis. Small stones are seen dependently within the gallbladder. The gallbladder is difficult to assess given surrounding ascites. The common bile duct is normal in caliber. Pancreas: The pancreas is within normal limits. Spleen: The spleen is unremarkable in appearance. Adrenals/Urinary Tract: The adrenal glands are unremarkable in appearance. The kidneys are within normal limits. There is no evidence of hydronephrosis. No renal or ureteral stones are identified. No perinephric stranding is seen. Stomach/Bowel: Scattered free air is noted tracking along the left paracolic gutter, extending anterior to Gerota's fascia on the left. Given underlying pneumatosis at the proximal descending colon, this is concerning for colonic perforation. Trace free air is also noted anteriorly underlying the abdominal wall. The visualized small bowel is grossly unremarkable, though difficult to assess given surrounding ascites. The stomach is relatively decompressed and unremarkable in appearance. The appendix is normal in caliber, without  evidence of appendicitis. Vascular/Lymphatic: Diffuse calcification is seen along the abdominal aorta and its branches. The abdominal aorta is otherwise grossly unremarkable. The inferior vena cava is grossly unremarkable. No retroperitoneal lymphadenopathy is seen. No pelvic sidewall lymphadenopathy is identified. Reproductive: The bladder is mildly distended and grossly unremarkable. The patient is status post prostatectomy. Other: Moderate to large volume ascites is noted within the abdomen and pelvis. The ascites appears slightly complex, with mild omental inflammation, raising concern for peritonitis given bowel perforation. Musculoskeletal: No acute osseous abnormalities are identified. Multilevel vacuum phenomenon is noted along the lower lumbar spine. The visualized musculature is unremarkable in appearance. IMPRESSION: 1. Free air tracking along the left paracolic gutter, extending anterior to Gerota's fascia on the left. Trace free air noted anteriorly underlying the abdominal wall. Given pneumatosis at the proximal descending colon, this is concerning for colonic perforation. 2. Moderate to large volume ascites within the abdomen and pelvis. The ascites appears slightly complex, with mild omental inflammation. Peritonitis cannot be excluded given underlying bowel perforation. 3. Findings of hepatic cirrhosis. 4. Cholelithiasis. Gallbladder not well assessed given underlying ascites. 5. Scattered coronary artery calcifications. Aortic Atherosclerosis (ICD10-I70.0). Critical Value/emergent results were called by  telephone at the time of interpretation on 08/03/2017 at 9:54 pm to Dr. Harvest Dark, who verbally acknowledged these results. Electronically Signed   By: Garald Balding M.D.   On: 08/03/2017 21:57    Scheduled Meds: . alfuzosin  10 mg Oral BID  . docusate sodium  100 mg Oral BID  . furosemide  20 mg Oral Daily  . hydrALAZINE  25 mg Oral Q8H  . insulin aspart  0-5 Units Subcutaneous QHS   . insulin aspart  0-9 Units Subcutaneous TID WC  . lactulose  20 g Oral Daily  . methadone  20 mg Oral Q8H  . mometasone-formoterol  2 puff Inhalation BID  . Sofosbuvir-Velpatasvir  1 tablet Oral Daily  . tiotropium  18 mcg Inhalation Daily   Continuous Infusions: . piperacillin-tazobactam (ZOSYN)  IV Stopped (08/04/17 1610)    Assessment/Plan:  1. Bowel perforation with free air under the diaphragm.  Patient is not a good surgical candidate secondary to ascites liver cirrhosis and thrombocytopenia.  Conservative management with IV Zosyn.  Case discussed with general surgery and okay to start clear liquid diet. 2. Liver cirrhosis with history of hepatitis C, ascites, thrombocytopenia.  Continue medication for hepatitis C 3. Type 2 diabetes mellitus on sliding scale 4. BPH on alfuzosin 5. hypertension on hydralazine 6. chronic pain on methadone 7. Constipation on Colace and lactulose.  Code Status:     Code Status Orders  (From admission, onward)        Start     Ordered   08/04/17 0055  Full code  Continuous     08/04/17 0054    Code Status History    Date Active Date Inactive Code Status Order ID Comments User Context   07/21/2017 2303 07/26/2017 1802 Full Code [id number]  Bettey Costa, MD Inpatient   07/12/2017 0139 07/15/2017 1916 Full Code [id number]  Lance Coon, MD Inpatient   12/21/2016 2159 12/23/2016 1715 Full Code [id number]  Idelle Crouch, MD Inpatient   09/09/2016 2250 09/16/2016 1727 Full Code [id number]  Gladstone Lighter, MD Inpatient   09/09/2016 1934 09/09/2016 2250 Full Code [id number]  Nena Polio, MD ED   09/03/2016 2152 09/05/2016 1844 Full Code [id number]  Henreitta Leber, MD Inpatient   11/06/2014 0551 11/07/2014 1818 Full Code [id number]  Juluis Mire, MD Inpatient   08/10/2014 2023 08/12/2014 1623 Full Code [id number]  Theodoro Grist, MD Inpatient     Family Communication: Case discussed with mother and sister at the bedside Disposition Plan: To be  determined based on clinical course  Consultants:  General surgery  Antibiotics:  Zosyn  Time spent: 38 minutes including ACP time  [employer]

## 2017-08-04 NOTE — Progress Notes (Signed)
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 The patient was admitted yesterday with a one-day history of abdominal pain. On CT scan, free air was seen in the retroperitoneum near the descending colon.  Subjective: The patient reports mild upper abdominal discomfort. He denies nausea and vomiting. He denies fever and chills. He denies chest pain and shortness of breath. He notes that he has not had a BM today but that he is passing flatus.  Objective: Vitals:   08/04/17 0000 08/04/17 0030 08/04/17 0107 08/04/17 0502  BP: (!) 154/65 (!) 157/70 (!) 171/76 (!) 117/56  Pulse: (!) 119 (!) 121 (!) 120 (!) 118  Resp: 11   16  Temp:   97.9 F (36.6 C) 98 F (36.7 C)  TempSrc:   Oral Oral  SpO2: 92% 92% 96% 92%  Weight:   70.5 kg (155 lb 8 oz) 71.1 kg (156 lb 11.2 oz)  Height:   5\' 10"  (1.778 m)     Physical Exam  Constitutional: No distress.  HENT:  Head: Normocephalic and atraumatic.  Eyes: EOM are normal. Scleral icterus is present.  Cardiovascular: Regular rhythm.  tachycardic  Respiratory: Effort normal. No respiratory distress.  GI: Soft. Bowel sounds are normal. He exhibits distension (secondary to ascites). There is tenderness (mild in upper abdomen). There is no rebound and no guarding.  Musculoskeletal: He exhibits no edema.  Neurological: He is alert.  Slightly confused  Skin: Skin is warm and dry.       Data Reviewed: I have personally reviewed following labs and imaging studies  CBC: Recent Labs  Lab 08/03/17 2027 08/04/17 0439  WBC 9.2 6.9  HGB 13.2 12.4*  HCT 38.9* 36.4*  MCV 101.5* 101.1*  PLT 52* 44*   Basic Metabolic Panel: Recent Labs  Lab 08/03/17 2027 08/04/17 0439  NA 133* 137  K 4.0 4.5  CL 100* 105   CO2 23 24  GLUCOSE 238* 151*  BUN 31* 27*  CREATININE 0.85 0.73  CALCIUM 9.4 8.5*   GFR: Estimated Creatinine Clearance: 96.3 mL/min (by C-G formula based on SCr of 0.73 mg/dL). Liver Function Tests: Recent Labs  Lab 08/03/17 2027  AST 134*  ALT 135*  ALKPHOS 110  BILITOT 1.6*  PROT 6.5  ALBUMIN 3.3*   Recent Labs  Lab 08/03/17 2027  LIPASE 37   Cardiac Enzymes: Recent Labs  Lab 08/03/17 2027 08/04/17 0439  TROPONINI 0.04* 0.04*   CBG: Recent Labs  Lab 08/04/17 0917 08/04/17 1151  GLUCAP 137* 107*     Scheduled Meds: . alfuzosin  10 mg Oral BID  . docusate sodium  100 mg Oral BID  . furosemide  20 mg Oral Daily  . hydrALAZINE  25 mg Oral Q8H  . insulin aspart  0-5 Units Subcutaneous QHS  . insulin aspart  0-9 Units Subcutaneous TID WC  . lactulose  20 g Oral Daily  . methadone  20 mg Oral Q8H  . mometasone-formoterol  2 puff Inhalation BID  . Sofosbuvir-Velpatasvir  1 tablet Oral Daily  . tiotropium  18 mcg Inhalation Daily   Continuous Infusions: . piperacillin-tazobactam (ZOSYN)  IV Stopped (08/04/17 9476)    Assessment & Plan: The patient is a 62 year old male with multiple medical co-morbidities including liver cirrhosis  with ascites secondary to hepatitis C. He is admitted with possible colon perforation of unknown cause. Clinically, he appears to be improving. His WBC count is normal and he is afebrile. He has only mild abdominal tenderness in the epigastrium. No peritoneal signs. It is possible that the perforation of the colon has sealed off.  I agree with continuing antibiotics for intra-abdominal infection. The patient is a very poor surgical candidate with high risk of mortality if taken to the OR secondary to his liver cirrhosis and thrombocytopenia. As the patient appears to be improving clinically, I am not opposed to beginning a clear liquid diet.   Hadley Pen, MD
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
## 2017-08-04 NOTE — ED Notes (Addendum)
Transport to floor room 105.AS 

## 2017-08-04 NOTE — Progress Notes (Signed)
Patient ID: [name], male   DOB: 10-Jul-1955, 62 y.o.   MRN: [id number]   ACP note  Patient, sister and mother at the bedside  Diagnosis: Bowel perforation with free air under the diaphragm.  This is usually a surgical emergency but with his liver cirrhosis and ascites and thrombocytopenia, he is not a good surgical candidate.  Conservative management will be done at this time.  The patient has not decompensated from admission.  Continue to monitor closely.  Patient is a full code.  Time spent on ACP discussion 17 minutes  Dr. Loletha Grayer

## 2017-08-04 NOTE — ED Notes (Signed)
Spoke with my charge nurse who was conversing with the house supervisor about pt's bed placement; room assignment to be changed to 1C

## 2017-08-04 NOTE — Progress Notes (Signed)
Pharmacy Antibiotic Note  [name] is a 62 y.o. male admitted on 08/03/2017 with intra-abdominal infx.  Pharmacy has been consulted for zosyn dosing. Patient admitted for abdominal pain s/t abdominal distension has a h/o hepatitis C liver cirrhosis w/ ascites w/ multiple past admissions for respiratory failure.  On CT abdomen shown to have free air in peritoneum suggestive of bowel perforation.  Plan: Will start patient zosyn 3.375g IV q8h  Height: 5\' 10"  (177.8 cm) Weight: 155 lb 8 oz (70.5 kg) IBW/kg (Calculated) : 73  Temp (24hrs), Avg:98 F (36.7 C), Min:97.9 F (36.6 C), Max:98.1 F (36.7 C)  Recent Labs  Lab 08/03/17 2027  WBC 9.2  CREATININE 0.85    Estimated Creatinine Clearance: 89.9 mL/min (by C-G formula based on SCr of 0.85 mg/dL).    No Known Allergies  Thank you for allowing pharmacy to be a part of this patient's care.  Tobie Lords, PharmD, BCPS Clinical Pharmacist 08/04/2017

## 2017-08-04 NOTE — ED Notes (Signed)
Called 1C to inform staff pt is leaving the department

## 2017-08-05 DIAGNOSIS — K631 Perforation of intestine (nontraumatic): Principal | ICD-10-CM

## 2017-08-05 LAB — GLUCOSE, CAPILLARY
GLUCOSE-CAPILLARY: 102 mg/dL — AB (ref 65–99)
GLUCOSE-CAPILLARY: 159 mg/dL — AB (ref 65–99)
GLUCOSE-CAPILLARY: 137 mg/dL — AB (ref 65–99)
Glucose-Capillary: 123 mg/dL — ABNORMAL HIGH (ref 65–99)

## 2017-08-05 NOTE — Progress Notes (Signed)
Patient ID: [name], male   DOB: 02-14-1956, 62 y.o.   MRN: [id number]  [facility] PROGRESS NOTE  [name] RCV:[id number] DOB: 1956/01/07 DOA: 08/03/2017 PCP: Theotis Burrow, MD  HPI/Subjective: Patient feeling a little bit better than yesterday.  Tolerating liquid diet.  Patient stated he had a bowel movement.  Still having a little abdominal discomfort.  He still coughing but doing better.  Objective: Vitals:   08/05/17 0824 08/05/17 1209  BP: 118/73 138/64  Pulse: (!) 105 (!) 101  Resp: 18 18  Temp: 98.1 F (36.7 C) 98.6 F (37 C)  SpO2: 92% 94%    Filed Weights   08/04/17 0107 08/04/17 0502 08/05/17 0439  Weight: 70.5 kg (155 lb 8 oz) 71.1 kg (156 lb 11.2 oz) 70.1 kg (154 lb 9.6 oz)    ROS: Review of Systems  Constitutional: Negative for chills and fever.  Eyes: Negative for blurred vision.  Respiratory: Positive for cough and shortness of breath.   Cardiovascular: Negative for chest pain.  Gastrointestinal: Positive for abdominal pain and constipation. Negative for diarrhea, nausea and vomiting.  Genitourinary: Negative for dysuria.  Musculoskeletal: Negative for joint pain.  Neurological: Negative for dizziness and headaches.   Exam: Physical Exam  Constitutional: He is oriented to person, place, and time.  HENT:  Nose: No mucosal edema.  Mouth/Throat: No oropharyngeal exudate or posterior oropharyngeal edema.  Eyes: Pupils are equal, round, and reactive to light. Conjunctivae, EOM and lids are normal.  Neck: No JVD present. Carotid bruit is not present. No edema present. No thyroid mass and no thyromegaly present.  Cardiovascular: S1 normal and S2 normal. Tachycardia present. Exam reveals no gallop.  No murmur heard. Pulses:      Dorsalis pedis pulses are 2+ on the right side, and 2+ on the left side.  Respiratory: No respiratory distress. He has decreased breath sounds in the right lower field and the left lower field. He has no  wheezes. He has rhonchi in the right lower field and the left lower field. He has no rales.  GI: Soft. Bowel sounds are normal. He exhibits distension. There is tenderness.  Musculoskeletal:       Right ankle: He exhibits no swelling.       Left ankle: He exhibits no swelling.  Lymphadenopathy:    He has no cervical adenopathy.  Neurological: He is alert and oriented to person, place, and time. No cranial nerve deficit.  Skin: Skin is warm. No rash noted. Nails show no clubbing.  Psychiatric: He has a normal mood and affect.      Data Reviewed: Basic Metabolic Panel: Recent Labs  Lab 08/03/17 2027 08/04/17 0439  NA 133* 137  K 4.0 4.5  CL 100* 105  CO2 23 24  GLUCOSE 238* 151*  BUN 31* 27*  CREATININE 0.85 0.73  CALCIUM 9.4 8.5*   Liver Function Tests: Recent Labs  Lab 08/03/17 2027  AST 134*  ALT 135*  ALKPHOS 110  BILITOT 1.6*  PROT 6.5  ALBUMIN 3.3*   Recent Labs  Lab 08/03/17 2027  LIPASE 37   CBC: Recent Labs  Lab 08/03/17 2027 08/04/17 0439  WBC 9.2 6.9  HGB 13.2 12.4*  HCT 38.9* 36.4*  MCV 101.5* 101.1*  PLT 52* 44*   Cardiac Enzymes: Recent Labs  Lab 08/03/17 2027 08/04/17 0439  TROPONINI 0.04* 0.04*   BNP (last 3 results) Recent Labs    09/03/16 1822 07/11/17 2016 07/21/17 1332  BNP 49.0 179.0* 40.0  CBG: Recent Labs  Lab 08/04/17 1151 08/04/17 1645 08/04/17 2019 08/05/17 0733 08/05/17 1204  GLUCAP 107* 142* 106* 102* 159*     Studies: Dg Abdomen Acute W/chest  Result Date: 08/03/2017 CLINICAL DATA:  Abdominal pain and constipation EXAM: DG ABDOMEN ACUTE W/ 1V CHEST COMPARISON:  Chest radiograph Jul 23, 2017; CT abdomen and pelvis September 09, 2016 FINDINGS: PA chest: There is fibrosis in the lower lung zones. There is no appreciable edema or consolidation. Heart size and pulmonary vascularity are normal. There is aortic atherosclerosis. No evident adenopathy. Supine and upright abdomen: There is stool throughout much of the  colon. There is no bowel dilatation or air-fluid level to suggest bowel obstruction. No evident free air. There is aortic atherosclerosis. IMPRESSION: Stool throughout much of colon. No bowel obstruction or free air. There is fibrosis in the lower lung zones. There is no frank edema or consolidation. There is aortic atherosclerosis. Aortic Atherosclerosis (ICD10-I70.0). Electronically Signed   By: Lowella Grip III M.D.   On: 08/03/2017 21:09   Ct Renal Stone Study  Result Date: 08/03/2017 CLINICAL DATA:  Acute onset of generalized abdominal pain. Decreased urinary output. Constipation. EXAM: CT ABDOMEN AND PELVIS WITHOUT CONTRAST TECHNIQUE: Multidetector CT imaging of the abdomen and pelvis was performed following the standard protocol without IV contrast. COMPARISON:  CT of the abdomen and pelvis from 09/09/2016 FINDINGS: Lower chest: Scarring is noted at the lung bases. Scattered coronary artery calcifications are seen. Hepatobiliary: The diffusely nodular contour of the liver is compatible with hepatic cirrhosis. Small stones are seen dependently within the gallbladder. The gallbladder is difficult to assess given surrounding ascites. The common bile duct is normal in caliber. Pancreas: The pancreas is within normal limits. Spleen: The spleen is unremarkable in appearance. Adrenals/Urinary Tract: The adrenal glands are unremarkable in appearance. The kidneys are within normal limits. There is no evidence of hydronephrosis. No renal or ureteral stones are identified. No perinephric stranding is seen. Stomach/Bowel: Scattered free air is noted tracking along the left paracolic gutter, extending anterior to Gerota's fascia on the left. Given underlying pneumatosis at the proximal descending colon, this is concerning for colonic perforation. Trace free air is also noted anteriorly underlying the abdominal wall. The visualized small bowel is grossly unremarkable, though difficult to assess given surrounding  ascites. The stomach is relatively decompressed and unremarkable in appearance. The appendix is normal in caliber, without evidence of appendicitis. Vascular/Lymphatic: Diffuse calcification is seen along the abdominal aorta and its branches. The abdominal aorta is otherwise grossly unremarkable. The inferior vena cava is grossly unremarkable. No retroperitoneal lymphadenopathy is seen. No pelvic sidewall lymphadenopathy is identified. Reproductive: The bladder is mildly distended and grossly unremarkable. The patient is status post prostatectomy. Other: Moderate to large volume ascites is noted within the abdomen and pelvis. The ascites appears slightly complex, with mild omental inflammation, raising concern for peritonitis given bowel perforation. Musculoskeletal: No acute osseous abnormalities are identified. Multilevel vacuum phenomenon is noted along the lower lumbar spine. The visualized musculature is unremarkable in appearance. IMPRESSION: 1. Free air tracking along the left paracolic gutter, extending anterior to Gerota's fascia on the left. Trace free air noted anteriorly underlying the abdominal wall. Given pneumatosis at the proximal descending colon, this is concerning for colonic perforation. 2. Moderate to large volume ascites within the abdomen and pelvis. The ascites appears slightly complex, with mild omental inflammation. Peritonitis cannot be excluded given underlying bowel perforation. 3. Findings of hepatic cirrhosis. 4. Cholelithiasis. Gallbladder not well assessed given  underlying ascites. 5. Scattered coronary artery calcifications. Aortic Atherosclerosis (ICD10-I70.0). Critical Value/emergent results were called by telephone at the time of interpretation on 08/03/2017 at 9:54 pm to Dr. Harvest Dark, who verbally acknowledged these results. Electronically Signed   By: Garald Balding M.D.   On: 08/03/2017 21:57    Scheduled Meds: . alfuzosin  10 mg Oral BID  . calcium carbonate  1  tablet Oral TID  . docusate sodium  100 mg Oral BID  . hydrALAZINE  25 mg Oral Q8H  . insulin aspart  0-5 Units Subcutaneous QHS  . insulin aspart  0-9 Units Subcutaneous TID WC  . lactulose  20 g Oral Daily  . methadone  20 mg Oral Q8H  . mometasone-formoterol  2 puff Inhalation BID  . Sofosbuvir-Velpatasvir  1 tablet Oral Daily  . tiotropium  18 mcg Inhalation Daily   Continuous Infusions: . piperacillin-tazobactam (ZOSYN)  IV Stopped (08/05/17 0940)    Assessment/Plan:  1. Bowel perforation with free air under the diaphragm.  Patient is not a good surgical candidate secondary to ascites, liver cirrhosis and thrombocytopenia.  Conservative management with IV Zosyn.  Advance to full liquid diet today.  Continue to monitor closely. 2. Liver cirrhosis with history of hepatitis C, ascites, thrombocytopenia.  Continue medication for hepatitis C. can consider a paracentesis at some point. 3. Type 2 diabetes mellitus on sliding scale 4. BPH on alfuzosin 5. hypertension on hydralazine 6. chronic pain on methadone 7. Constipation on Colace and lactulose.  Code Status:     Code Status Orders  (From admission, onward)        Start     Ordered   08/04/17 0055  Full code  Continuous     08/04/17 0054    Code Status History    Date Active Date Inactive Code Status Order ID Comments User Context   07/21/2017 2303 07/26/2017 1802 Full Code [id number]  Bettey Costa, MD Inpatient   07/12/2017 0139 07/15/2017 1916 Full Code [id number]  Lance Coon, MD Inpatient   12/21/2016 2159 12/23/2016 1715 Full Code [id number]  Idelle Crouch, MD Inpatient   09/09/2016 2250 09/16/2016 1727 Full Code [id number]  Gladstone Lighter, MD Inpatient   09/09/2016 1934 09/09/2016 2250 Full Code [id number]  Nena Polio, MD ED   09/03/2016 2152 09/05/2016 1844 Full Code [id number]  Henreitta Leber, MD Inpatient   11/06/2014 0551 11/07/2014 1818 Full Code [id number]  Juluis Mire, MD Inpatient   08/10/2014 2023 08/12/2014  1623 Full Code [id number]  Theodoro Grist, MD Inpatient     Family Communication: Case discussed with mother and sister  yesterday Disposition Plan: To be determined based on clinical course  Consultants:  General surgery  Antibiotics:  Zosyn  Time spent:  26 minutes  [place]

## 2017-08-05 NOTE — Plan of Care (Signed)
Pt verbalized that he feels better today. Norco given once for abd pain with improvement. Pt had a BM. Tolerates full liquid diet.

## 2017-08-05 NOTE — Evaluation (Signed)
Physical Therapy Evaluation Patient Details Name: [name] MRN: [id number] DOB: [birth year] Today's Date: 08/05/2017   History of Present Illness  Pt is a57 y.o.malewith a known history ofhypertension, COPD on 2 L continuous home oxygen, diabetes type 2, advanced cirrhosis secondary to hepatitis C. Patient was recently hospitalized for pneumonia and hepatic encephalopathy due to noncompliance with his medications. He presented to emergency room, this time for abdominal pain and inability to urinate and have bowel movement. Patient stated that he only had a very small amount of urine and bowel movement in the past 2-3 days,despite drinking plenty of water.  At the arrival to emergency room, patient was noted to be tachycardic with heart rate in 140s. Blood test done emergency room are remarkable for elevated glucose level at 238.WBCs 9.2.Sodium level is 133.Platelet count is low at 52. Troponin level is slightly elevated at 0.04. Abdominal CAT scan showed free air tracking along the left paracolic gutter, extending anterior to Gerota's fascia on the left. There was trace free air noted anteriorly underlying the abdominal wall.  Large amount of ascites and hepatic cirrhosis are noted again. Patient is admitted for further evaluation and treatment.  Assessment includes: bowel perforation, liver cirrhosis, DM II, HTN, chronic pain, and constipation.      Clinical Impression  Pt presents with mild deficits in strength and gait and with moderate deficits in activity tolerance.  Pt was Ind with bed mobility and transfers with good eccentric and concentric control and stability during sit to/from stand.  Pt was able to amb 20' without a RW with good stability and with some minimal SOB noted.  Pt's SpO2 on 2LO2/min remained >/= 95% during the session with HR increasing from low 100s at rest to 112 bpm after amb.  Pt will benefit from HHPT services upon discharge to safely address above deficits for  decreased risk of further functional decline and eventual return to PLOF.        Follow Up Recommendations Home health PT    Equipment Recommendations  None recommended by PT    Recommendations for Other Services       Precautions / Restrictions Precautions Precautions: Fall Restrictions Weight Bearing Restrictions: No      Mobility  Bed Mobility Overal bed mobility: Independent                Transfers Overall transfer level: Independent               General transfer comment: Good stability and control during sit to/from stand  Ambulation/Gait Ambulation/Gait assistance: Supervision Ambulation Distance (Feet): 20 Feet Assistive device: None Gait Pattern/deviations: Step-through pattern;Decreased step length - right;Decreased step length - left Gait velocity: Decreased   General Gait Details: Good stability during amb without LOB during amb without an AD  Stairs            Wheelchair Mobility    Modified Rankin (Stroke Patients Only)       Balance Overall balance assessment: No apparent balance deficits (not formally assessed)                                           Pertinent Vitals/Pain Pain Assessment: 0-10 Pain Score: 8  Pain Location: Abdomen Pain Descriptors / Indicators: Sore Pain Intervention(s): Premedicated before session;Monitored during session    Home Living Family/patient expects to be discharged to:: Private residence Living Arrangements: Parent  Available Help at Discharge: Family;Available 24 hours/day Type of Home: House Home Access: Stairs to enter Entrance Stairs-Rails: Right;Left;Can reach both Entrance Stairs-Number of Steps: 2 Home Layout: One level Home Equipment: Cane - single point Additional Comments: Pt reports owns a walker but unsure of the type    Prior Function Level of Independence: Independent         Comments: Ind amb without AD, no falls in the last 6 months, Ind with  ADLs, caregiver comes 4x/wk to assist with ADLs for his mother     Hand Dominance        Extremity/Trunk Assessment   Upper Extremity Assessment Upper Extremity Assessment: Overall WFL for tasks assessed    Lower Extremity Assessment Lower Extremity Assessment: Generalized weakness       Communication   Communication: No difficulties  Cognition Arousal/Alertness: Awake/alert Behavior During Therapy: WFL for tasks assessed/performed Overall Cognitive Status: Within Functional Limits for tasks assessed                                        General Comments      Exercises     Assessment/Plan    PT Assessment Patient needs continued PT services  PT Problem List Decreased strength;Decreased activity tolerance       PT Treatment Interventions DME instruction;Gait training;Stair training;Therapeutic exercise;Patient/family education;Therapeutic activities    PT Goals (Current goals can be found in the Care Plan section)  Acute Rehab PT Goals Patient Stated Goal: To get a little stronger PT Goal Formulation: With patient Time For Goal Achievement: 08/18/17 Potential to Achieve Goals: Good    Frequency Min 2X/week   Barriers to discharge        Co-evaluation               AM-PAC PT "6 Clicks" Daily Activity  Outcome Measure Difficulty turning over in bed (including adjusting bedclothes, sheets and blankets)?: None Difficulty moving from lying on back to sitting on the side of the bed? : None Difficulty sitting down on and standing up from a chair with arms (e.g., wheelchair, bedside commode, etc,.)?: None Help needed moving to and from a bed to chair (including a wheelchair)?: None Help needed walking in hospital room?: None Help needed climbing 3-5 steps with a railing? : A Little 6 Click Score: 23    End of Session Equipment Utilized During Treatment: Oxygen Activity Tolerance: Patient tolerated treatment well Patient left: in  bed;with call bell/phone within reach;Other (comment)(No bed alarm required per nursing) Nurse Communication: Mobility status PT Visit Diagnosis: Difficulty in walking, not elsewhere classified (R26.2);Muscle weakness (generalized) (M62.81)    Time: 4193-7902 PT Time Calculation (min) (ACUTE ONLY): 20 min   Charges:   PT Evaluation $PT Eval Low Complexity: 1 Low     PT G Codes:        DRoyetta Asal PT, DPT 08/05/17, 3:45 PM

## 2017-08-05 NOTE — Progress Notes (Signed)
CC: Contained perforated colon  Subjective: He is doing well.  Much improved abdominal pain.  No fevers no chills.  He is tolerating clear liquid diets no nausea and vomiting. T scan personally reviewed there is evidence of free air within the retroperitoneum coming from the distal descending / sigmoid colon.  There is no overt evidence of free air.  There is massive ascites evidence of cirrhosis.. Patient is debilitated and is a Child C classification with thrombocytopenia hyperbilirubinemia and hypoalbuminemia. He has hx COPD on Home O2 and non compliance   Objective: Vital signs in last 24 hours: Temp:  [98.1 F (36.7 C)-98.4 F (36.9 C)] 98.1 F (36.7 C) (05/28 0824) Pulse Rate:  [105-121] 105 (05/28 0824) Resp:  [18-22] 18 (05/28 0824) BP: (118-159)/(68-80) 118/73 (05/28 0824) SpO2:  [91 %-94 %] 92 % (05/28 0824) Weight:  [70.1 kg (154 lb 9.6 oz)] 70.1 kg (154 lb 9.6 oz) (05/28 0439) Last BM Date: 08/04/17  Intake/Output from previous day: 05/27 0701 - 05/28 0700 In: 100 [IV Piggyback:100] Out: 400 [Urine:400] Intake/Output this shift: No intake/output data recorded.  Physical exam: Dilatated and cachectic  male no acute distress.  Awake and alert.  No evidence of mental status changes or encephalopathy Abd: Massive ascitis w caput medusa. Soft, very minimal tenderness, no peritonitis Lab Results: CBC  Recent Labs    08/03/17 2027 08/04/17 0439  WBC 9.2 6.9  HGB 13.2 12.4*  HCT 38.9* 36.4*  PLT 52* 44*   BMET Recent Labs    08/03/17 2027 08/04/17 0439  NA 133* 137  K 4.0 4.5  CL 100* 105  CO2 23 24  GLUCOSE 238* 151*  BUN 31* 27*  CREATININE 0.85 0.73  CALCIUM 9.4 8.5*   PT/INR No results for input(s): LABPROT, INR in the last 72 hours. ABG No results for input(s): PHART, HCO3 in the last 72 hours.  Invalid input(s): PCO2, PO2  Studies/Results: Dg Abdomen Acute W/chest  Result Date: 08/03/2017 CLINICAL DATA:  Abdominal pain and constipation  EXAM: DG ABDOMEN ACUTE W/ 1V CHEST COMPARISON:  Chest radiograph Jul 23, 2017; CT abdomen and pelvis September 09, 2016 FINDINGS: PA chest: There is fibrosis in the lower lung zones. There is no appreciable edema or consolidation. Heart size and pulmonary vascularity are normal. There is aortic atherosclerosis. No evident adenopathy. Supine and upright abdomen: There is stool throughout much of the colon. There is no bowel dilatation or air-fluid level to suggest bowel obstruction. No evident free air. There is aortic atherosclerosis. IMPRESSION: Stool throughout much of colon. No bowel obstruction or free air. There is fibrosis in the lower lung zones. There is no frank edema or consolidation. There is aortic atherosclerosis. Aortic Atherosclerosis (ICD10-I70.0). Electronically Signed   By: Lowella Grip III M.D.   On: 08/03/2017 21:09   Ct Renal Stone Study  Result Date: 08/03/2017 CLINICAL DATA:  Acute onset of generalized abdominal pain. Decreased urinary output. Constipation. EXAM: CT ABDOMEN AND PELVIS WITHOUT CONTRAST TECHNIQUE: Multidetector CT imaging of the abdomen and pelvis was performed following the standard protocol without IV contrast. COMPARISON:  CT of the abdomen and pelvis from 09/09/2016 FINDINGS: Lower chest: Scarring is noted at the lung bases. Scattered coronary artery calcifications are seen. Hepatobiliary: The diffusely nodular contour of the liver is compatible with hepatic cirrhosis. Small stones are seen dependently within the gallbladder. The gallbladder is difficult to assess given surrounding ascites. The common bile duct is normal in caliber. Pancreas: The pancreas is within normal limits. Spleen:  The spleen is unremarkable in appearance. Adrenals/Urinary Tract: The adrenal glands are unremarkable in appearance. The kidneys are within normal limits. There is no evidence of hydronephrosis. No renal or ureteral stones are identified. No perinephric stranding is seen. Stomach/Bowel:  Scattered free air is noted tracking along the left paracolic gutter, extending anterior to Gerota's fascia on the left. Given underlying pneumatosis at the proximal descending colon, this is concerning for colonic perforation. Trace free air is also noted anteriorly underlying the abdominal wall. The visualized small bowel is grossly unremarkable, though difficult to assess given surrounding ascites. The stomach is relatively decompressed and unremarkable in appearance. The appendix is normal in caliber, without evidence of appendicitis. Vascular/Lymphatic: Diffuse calcification is seen along the abdominal aorta and its branches. The abdominal aorta is otherwise grossly unremarkable. The inferior vena cava is grossly unremarkable. No retroperitoneal lymphadenopathy is seen. No pelvic sidewall lymphadenopathy is identified. Reproductive: The bladder is mildly distended and grossly unremarkable. The patient is status post prostatectomy. Other: Moderate to large volume ascites is noted within the abdomen and pelvis. The ascites appears slightly complex, with mild omental inflammation, raising concern for peritonitis given bowel perforation. Musculoskeletal: No acute osseous abnormalities are identified. Multilevel vacuum phenomenon is noted along the lower lumbar spine. The visualized musculature is unremarkable in appearance. IMPRESSION: 1. Free air tracking along the left paracolic gutter, extending anterior to Gerota's fascia on the left. Trace free air noted anteriorly underlying the abdominal wall. Given pneumatosis at the proximal descending colon, this is concerning for colonic perforation. 2. Moderate to large volume ascites within the abdomen and pelvis. The ascites appears slightly complex, with mild omental inflammation. Peritonitis cannot be excluded given underlying bowel perforation. 3. Findings of hepatic cirrhosis. 4. Cholelithiasis. Gallbladder not well assessed given underlying ascites. 5. Scattered  coronary artery calcifications. Aortic Atherosclerosis (ICD10-I70.0). Critical Value/emergent results were called by telephone at the time of interpretation on 08/03/2017 at 9:54 pm to Dr. Harvest Dark, who verbally acknowledged these results. Electronically Signed   By: Garald Balding M.D.   On: 08/03/2017 21:57    Anti-infectives: Anti-infectives (From admission, onward)   Start     Dose/Rate Route Frequency Ordered Stop   08/04/17 1000  Sofosbuvir-Velpatasvir 400-100 MG TABS 1 tablet     1 tablet Oral Daily 08/04/17 0054     08/04/17 0600  piperacillin-tazobactam (ZOSYN) IVPB 3.375 g     3.375 g 12.5 mL/hr over 240 Minutes Intravenous Every 8 hours 08/04/17 0214     08/03/17 2200  piperacillin-tazobactam (ZOSYN) IVPB 3.375 g     3.375 g 100 mL/hr over 30 Minutes Intravenous  Once 08/03/17 2156 08/03/17 2248      Assessment/Plan: Contained colon perforation in a patient with severe multiple comorbidities including cirrhosis that is decompensated with thrombocytopenia, malnutrition, massive ascites and COPD on home oxygen.  Patient is a poor surgical candidate and his mortality will be extremely high.  He seems to be responded to medical therapy therefore we will continue aggressive IV antibiotics and serial abdominal exams.  I will order some repeat ammonia levels, CMP and CBC and will obviously continue to follow to perform serial abdominal exams. I Spent over 35 minutes in this encounter with the majority of time spent in coordination and counseling of his care.  Caroleen Hamman, MD, [facility]  08/05/2017

## 2017-08-06 LAB — COMPREHENSIVE METABOLIC PANEL
ALK PHOS: 73 U/L (ref 38–126)
ALT: 96 U/L — AB (ref 17–63)
AST: 69 U/L — ABNORMAL HIGH (ref 15–41)
Albumin: 2.7 g/dL — ABNORMAL LOW (ref 3.5–5.0)
Anion gap: 6 (ref 5–15)
BILIRUBIN TOTAL: 2.4 mg/dL — AB (ref 0.3–1.2)
BUN: 12 mg/dL (ref 6–20)
CALCIUM: 8.3 mg/dL — AB (ref 8.9–10.3)
CO2: 25 mmol/L (ref 22–32)
Chloride: 104 mmol/L (ref 101–111)
Creatinine, Ser: 0.58 mg/dL — ABNORMAL LOW (ref 0.61–1.24)
Glucose, Bld: 151 mg/dL — ABNORMAL HIGH (ref 65–99)
Potassium: 3.8 mmol/L (ref 3.5–5.1)
Sodium: 135 mmol/L (ref 135–145)
TOTAL PROTEIN: 5.3 g/dL — AB (ref 6.5–8.1)

## 2017-08-06 LAB — GLUCOSE, CAPILLARY
GLUCOSE-CAPILLARY: 128 mg/dL — AB (ref 65–99)
GLUCOSE-CAPILLARY: 183 mg/dL — AB (ref 65–99)
GLUCOSE-CAPILLARY: 126 mg/dL — AB (ref 65–99)
GLUCOSE-CAPILLARY: 108 mg/dL — AB (ref 65–99)

## 2017-08-06 LAB — CBC
HEMATOCRIT: 36.2 % — AB (ref 40.0–52.0)
Hemoglobin: 12.3 g/dL — ABNORMAL LOW (ref 13.0–18.0)
MCH: 34.5 pg — AB (ref 26.0–34.0)
MCHC: 33.9 g/dL (ref 32.0–36.0)
MCV: 101.9 fL — AB (ref 80.0–100.0)
Platelets: 43 10*3/uL — ABNORMAL LOW (ref 150–440)
RBC: 3.56 MIL/uL — AB (ref 4.40–5.90)
RDW: 14 % (ref 11.5–14.5)
WBC: 6.4 10*3/uL (ref 3.8–10.6)

## 2017-08-06 LAB — PROTIME-INR
INR: 1.37
PROTHROMBIN TIME: 16.8 s — AB (ref 11.4–15.2)

## 2017-08-06 LAB — AMMONIA: Ammonia: 46 umol/L — ABNORMAL HIGH (ref 9–35)

## 2017-08-06 LAB — BRAIN NATRIURETIC PEPTIDE: B Natriuretic Peptide: 46 pg/mL (ref 0.0–100.0)

## 2017-08-06 MED ORDER — FUROSEMIDE 10 MG/ML IJ SOLN
20.0000 mg | Freq: Once | INTRAMUSCULAR | Status: AC
  Administered 2017-08-06: 20 mg via INTRAVENOUS
  Filled 2017-08-06: qty 2

## 2017-08-06 MED ORDER — FUROSEMIDE 20 MG PO TABS: 10.0000 mg | ORAL_TABLET | Freq: Every day | ORAL | Status: DC

## 2017-08-06 MED ORDER — FUROSEMIDE 20 MG PO TABS
10.0000 mg | ORAL_TABLET | Freq: Every day | ORAL | Status: DC
  Administered 2017-08-07 – 2017-08-09 (×3): 10 mg via ORAL
  Filled 2017-08-06 (×3): qty 1

## 2017-08-06 MED ORDER — METOPROLOL TARTRATE 25 MG PO TABS
12.5000 mg | ORAL_TABLET | Freq: Two times a day (BID) | ORAL | Status: DC
  Administered 2017-08-06 – 2017-08-09 (×7): 12.5 mg via ORAL
  Filled 2017-08-06 (×7): qty 1

## 2017-08-06 MED ORDER — SPIRONOLACTONE 25 MG PO TABS
12.5000 mg | ORAL_TABLET | Freq: Every day | ORAL | Status: DC
  Administered 2017-08-06 – 2017-08-09 (×4): 12.5 mg via ORAL
  Filled 2017-08-06: qty 0.5
  Filled 2017-08-06: qty 1
  Filled 2017-08-06 (×2): qty 0.5
  Filled 2017-08-06 (×3): qty 1
  Filled 2017-08-06: qty 0.5

## 2017-08-06 NOTE — Plan of Care (Addendum)
Abdominal pain improving per pt. Abdomen distended, but feels softer than yesterday . Per pt he had 4 stools during the shift. Tolerates full liquid diet. Pt refuses teds/SCD's. Ambulates in the room independently. Pt refused to ambulate outside of room.

## 2017-08-06 NOTE — Progress Notes (Signed)
SURGICAL PROGRESS NOTE (cpt [phone])  Hospital Day(s): 3.   Post op day(s):  [name]   Interval History: Patient seen and examined, no acute events or new complaints overnight, though still mild LLQ abdominal pain. Patient reports multiple large BM's with decreasing abdominal pain, tolerating full liquids diet, denies fever/chills, N/V, CP, or SOB, patient ambulating in his room and in the halls around nurses' station without difficulty.  Review of Systems:  Constitutional: denies fever, chills  HEENT: denies cough or congestion  Respiratory: denies any shortness of breath  Cardiovascular: denies chest pain or palpitations  Gastrointestinal: abdominal pain, N/V, and bowel function as per interval history Genitourinary: denies burning with urination or urinary frequency Musculoskeletal: denies pain, decreased motor or sensation Integumentary: denies any other rashes or skin discolorations Neurological: denies HA or vision/hearing changes   Vital signs in last 24 hours: [min-max] current  Temp:  [98.1 F (36.7 C)-98.7 F (37.1 C)] 98.7 F (37.1 C) (05/29 0352) Pulse Rate:  [101-109] 105 (05/29 0638) Resp:  [16-19] 16 (05/29 0352) BP: (118-148)/(64-92) 143/76 (05/29 0638) SpO2:  [92 %-96 %] 94 % (05/29 0352) Weight:  [155 lb 11.2 oz (70.6 kg)] 155 lb 11.2 oz (70.6 kg) (05/29 0352)     Height: 5\' 10"  (177.8 cm) Weight: 155 lb 11.2 oz (70.6 kg) BMI (Calculated): 22.34   Intake/Output this shift:  No intake/output data recorded.   Intake/Output last 2 shifts:  @IOLAST2SHIFTS @   Physical Exam:  Constitutional: alert, cooperative and no distress  HENT: normocephalic without obvious abnormality  Eyes: PERRL, EOM's grossly intact and symmetric  Neuro: CN II - XII grossly intact and symmetric without deficit  Respiratory: breathing non-labored at rest  Cardiovascular: regular rate and sinus rhythm  Gastrointestinal: soft and stably distended with moderately large volume ascites, caput  medusa, and mild focal LLQ tenderness to palpation Musculoskeletal: UE and LE FROM, motor and sensation grossly intact, NT   Labs:  CBC Latest Ref Rng & Units 08/06/2017 08/04/2017 08/03/2017  WBC 3.8 - 10.6 K/uL 6.4 6.9 9.2  Hemoglobin 13.0 - 18.0 g/dL 12.3(L) 12.4(L) 13.2  Hematocrit 40.0 - 52.0 % 36.2(L) 36.4(L) 38.9(L)  Platelets 150 - 440 K/uL 43(L) 44(L) 52(L)   CMP Latest Ref Rng & Units 08/06/2017 08/04/2017 08/03/2017  Glucose 65 - 99 mg/dL 151(H) 151(H) 238(H)  BUN 6 - 20 mg/dL 12 27(H) 31(H)  Creatinine 0.61 - 1.24 mg/dL 0.58(L) 0.73 0.85  Sodium 135 - 145 mmol/L 135 137 133(L)  Potassium 3.5 - 5.1 mmol/L 3.8 4.5 4.0  Chloride 101 - 111 mmol/L 104 105 100(L)  CO2 22 - 32 mmol/L 25 24 23   Calcium 8.9 - 10.3 mg/dL 8.3(L) 8.5(L) 9.4  Total Protein 6.5 - 8.1 g/dL 5.3(L) - 6.5  Total Bilirubin 0.3 - 1.2 mg/dL 2.4(H) - 1.6(H)  Alkaline Phos 38 - 126 U/L 73 - 110  AST 15 - 41 U/L 69(H) - 134(H)  ALT 17 - 63 U/L 96(H) - 135(H)   Imaging studies: No new pertinent imaging studies   Assessment/Plan: (ICD-10's: K63.1) 62 y.o. 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   - continue full liquids diet for today until pain further improves  -  continue IV antibiotics, monitor abdominal exam and bowel function  - no plans for surgery at this time considering high surgical risk and clinically improving  - medical management of patient's extensive comorbidities as per medical team  - DVT prophylaxis and ambulation encouraged  - Zosyn to  Augmentin at discharge  All of the above findings and recommendations were discussed with the patient and the medical team, and all of patient's questions were answered to his expressed satisfaction.  Thank you for the opportunity to participate in this patient's care.  -- Marilynne Drivers Rosana Hoes, MD, [place]: [facility] - Partnering for exceptional care. Office: [phone]

## 2017-08-06 NOTE — Progress Notes (Signed)
[place] at [place] NAME: [name]    MR#:  [id number]  DATE OF BIRTH:  1955/12/21  SUBJECTIVE:  Patient feeling better, denies belly pain, continue tachycardia noted, case discussed with nursing staff, noted rales on auscultation of the lungs with pitting lower extremity edema  REVIEW OF SYSTEMS:  CONSTITUTIONAL: No fever, fatigue or weakness.  EYES: No blurred or double vision.  EARS, NOSE, AND THROAT: No tinnitus or ear pain.  RESPIRATORY: No cough, shortness of breath, wheezing or hemoptysis.  CARDIOVASCULAR: No chest pain, orthopnea, edema.  GASTROINTESTINAL: No nausea, vomiting, diarrhea or abdominal pain.  GENITOURINARY: No dysuria, hematuria.  ENDOCRINE: No polyuria, nocturia,  HEMATOLOGY: No anemia, easy bruising or bleeding SKIN: No rash or lesion. MUSCULOSKELETAL: No joint pain or arthritis.   NEUROLOGIC: No tingling, numbness, weakness.  PSYCHIATRY: No anxiety or depression.   ROS  DRUG ALLERGIES:  No Known Allergies  VITALS:  Blood pressure (!) 137/54, pulse (!) 120, temperature 98 F (36.7 C), temperature source Axillary, resp. rate 19, height 5\' 10"  (1.778 m), weight 70.6 kg (155 lb 11.2 oz), SpO2 93 %.  PHYSICAL EXAMINATION:  GENERAL:  62 y.o.-year-old patient lying in the bed with no acute distress.  EYES: Pupils equal, round, reactive to light and accommodation. No scleral icterus. Extraocular muscles intact.  HEENT: Head atraumatic, normocephalic. Oropharynx and nasopharynx clear.  NECK:  Supple, no jugular venous distention. No thyroid enlargement, no tenderness.  LUNGS: Normal breath sounds bilaterally, no wheezing, rales,rhonchi or crepitation. No use of accessory muscles of respiration.  CARDIOVASCULAR: S1, S2 normal. No murmurs, rubs, or gallops.  ABDOMEN: Soft, nontender, nondistended. Bowel sounds present. No organomegaly or mass.  EXTREMITIES: No pedal edema, cyanosis, or clubbing.  NEUROLOGIC:  Cranial nerves II through XII are intact. Muscle strength 5/5 in all extremities. Sensation intact. Gait not checked.  PSYCHIATRIC: The patient is alert and oriented x 3.  SKIN: No obvious rash, lesion, or ulcer.   Physical Exam LABORATORY PANEL:   CBC Recent Labs  Lab 08/06/17 0403  WBC 6.4  HGB 12.3*  HCT 36.2*  PLT 43*   ------------------------------------------------------------------------------------------------------------------  Chemistries  Recent Labs  Lab 08/06/17 0403  NA 135  K 3.8  CL 104  CO2 25  GLUCOSE 151*  BUN 12  CREATININE 0.58*  CALCIUM 8.3*  AST 69*  ALT 96*  ALKPHOS 73  BILITOT 2.4*   ------------------------------------------------------------------------------------------------------------------  Cardiac Enzymes Recent Labs  Lab 08/03/17 2027 08/04/17 0439  TROPONINI 0.04* 0.04*   ------------------------------------------------------------------------------------------------------------------  RADIOLOGY:  No results found.  ASSESSMENT AND PLAN:  *Acute bowel perforation Etiology unknown General surgery input appreciated-not a good surgical candidate secondary to ascites, liver cirrhosis and thrombocytopenia Continue conservative management, empiric Zosyn for now-May change to Augmentin on day of discharge, advance diet as tolerated  *Acute diastolic congestive heart failure exacerbation Secondary to above Echocardiogram noted for normal ejection fraction and stage I diastolic dysfunction Start Lasix, spironolactone, BMP in the morning, beta-blocker therapy, avoid antiplatelet therapy given perforation, encourage TED hose, strict I&O monitoring, daily weights  *cirrhosis secondary to hepatitis C with associated ascites, thrombocytopenia Stable  *Chronic BPH  Stable Continue alfuzosin  *Chronic benign essential hypertension Add beta-blocker therapy  *Chronic pain syndrome Stable Continue methadone   All the records  are reviewed and case discussed with Care Management/Social Workerr. Management plans discussed with the patient, family and they are in agreement.  CODE STATUS: full  TOTAL TIME TAKING CARE OF THIS PATIENT: 81  minutes.     POSSIBLE D/C IN 1-2 DAYS, DEPENDING ON CLINICAL CONDITION.   Avel Peace Salary M.D on 08/06/2017   Between 7am to 6pm - Pager - [phone]  After 6pm go to [email or web address] - password [email or web address] [facility]  Office  [phone]  CC: Primary care physician; Theotis Burrow, MD  Note: This dictation was prepared with Dragon dictation along with smaller phrase technology. Any transcriptional errors that result from this process are unintentional.

## 2017-08-07 LAB — GLUCOSE, CAPILLARY
GLUCOSE-CAPILLARY: 106 mg/dL — AB (ref 65–99)
GLUCOSE-CAPILLARY: 158 mg/dL — AB (ref 65–99)
Glucose-Capillary: 120 mg/dL — ABNORMAL HIGH (ref 65–99)
Glucose-Capillary: 181 mg/dL — ABNORMAL HIGH (ref 65–99)

## 2017-08-07 LAB — BASIC METABOLIC PANEL
Anion gap: 7 (ref 5–15)
BUN: 12 mg/dL (ref 6–20)
CHLORIDE: 106 mmol/L (ref 101–111)
CO2: 24 mmol/L (ref 22–32)
CREATININE: 0.6 mg/dL — AB (ref 0.61–1.24)
Calcium: 8.5 mg/dL — ABNORMAL LOW (ref 8.9–10.3)
GFR calc non Af Amer: >60 mL/min (ref >60)
Glucose, Bld: 119 mg/dL — ABNORMAL HIGH (ref 65–99)
POTASSIUM: 3.9 mmol/L (ref 3.5–5.1)
SODIUM: 137 mmol/L (ref 135–145)

## 2017-08-07 NOTE — Progress Notes (Signed)
PT Cancellation Note  Patient Details Name: [name] MRN: [id number] DOB: 11-Sep-1955   Cancelled Treatment:    Reason Eval/Treat Not Completed: Patient declined, no reason specified   Pt offered and encouraged therapy session this am.  Pt refusing mobility stating he just returned to bed.  Reports walking to and from bathroom without AD or assist several times and stated he is walking around unit.  Pt agitated at encouragement to participate and continued to refuse.  Will continue as appropriate.   Chesley Noon 08/07/2017, 9:18 AM

## 2017-08-07 NOTE — Progress Notes (Signed)
[place] at [place] NAME: [name]    MR#:  [id number]  DATE OF BIRTH:  10-21-1955  SUBJECTIVE:  CHIEF COMPLAINT:  No chief complaint on file. Decreasing abdominal pain, case discussed with general surgery, no events overnight  REVIEW OF SYSTEMS:  CONSTITUTIONAL: No fever, fatigue or weakness.  EYES: No blurred or double vision.  EARS, NOSE, AND THROAT: No tinnitus or ear pain.  RESPIRATORY: No cough, shortness of breath, wheezing or hemoptysis.  CARDIOVASCULAR: No chest pain, orthopnea, edema.  GASTROINTESTINAL: No nausea, vomiting, diarrhea or abdominal pain.  GENITOURINARY: No dysuria, hematuria.  ENDOCRINE: No polyuria, nocturia,  HEMATOLOGY: No anemia, easy bruising or bleeding SKIN: No rash or lesion. MUSCULOSKELETAL: No joint pain or arthritis.   NEUROLOGIC: No tingling, numbness, weakness.  PSYCHIATRY: No anxiety or depression.   ROS  DRUG ALLERGIES:  No Known Allergies  VITALS:  Blood pressure (!) 147/80, pulse 90, temperature 98.5 F (36.9 C), temperature source Oral, resp. rate 17, height 5\' 10"  (1.778 m), weight 69.5 kg (153 lb 3.5 oz), SpO2 93 %.  PHYSICAL EXAMINATION:  GENERAL:  62 y.o.-year-old patient lying in the bed with no acute distress.  EYES: Pupils equal, round, reactive to light and accommodation. No scleral icterus. Extraocular muscles intact.  HEENT: Head atraumatic, normocephalic. Oropharynx and nasopharynx clear.  NECK:  Supple, no jugular venous distention. No thyroid enlargement, no tenderness.  LUNGS: Normal breath sounds bilaterally, no wheezing, rales,rhonchi or crepitation. No use of accessory muscles of respiration.  CARDIOVASCULAR: S1, S2 normal. No murmurs, rubs, or gallops.  ABDOMEN: Soft, nontender, nondistended. Bowel sounds present. No organomegaly or mass.  EXTREMITIES: No pedal edema, cyanosis, or clubbing.  NEUROLOGIC: Cranial nerves II through XII are intact. Muscle strength 5/5  in all extremities. Sensation intact. Gait not checked.  PSYCHIATRIC: The patient is alert and oriented x 3.  SKIN: No obvious rash, lesion, or ulcer.   Physical Exam LABORATORY PANEL:   CBC Recent Labs  Lab 08/06/17 0403  WBC 6.4  HGB 12.3*  HCT 36.2*  PLT 43*   ------------------------------------------------------------------------------------------------------------------  Chemistries  Recent Labs  Lab 08/06/17 0403 08/07/17 0518  NA 135 137  K 3.8 3.9  CL 104 106  CO2 25 24  GLUCOSE 151* 119*  BUN 12 12  CREATININE 0.58* 0.60*  CALCIUM 8.3* 8.5*  AST 69*  --   ALT 96*  --   ALKPHOS 73  --   BILITOT 2.4*  --    ------------------------------------------------------------------------------------------------------------------  Cardiac Enzymes Recent Labs  Lab 08/03/17 2027 08/04/17 0439  TROPONINI 0.04* 0.04*   ------------------------------------------------------------------------------------------------------------------  RADIOLOGY:  No results found.  ASSESSMENT AND PLAN:  *Acute bowel perforation Etiology unknown General surgery input appreciated-not a good surgical candidate secondary to ascites givenliver cirrhosis and thrombocytopenia Continue conservative management, empiric Zosyn for now-May change to Augmentin on day of discharge, diet to be advanced today in discussion with general surgery   *Acute diastolic congestive heart failure exacerbation Improved Secondary to above Echocardiogram noted for normal ejection fraction and stage I diastolic dysfunction Continue Lasix, spironolactone, BB, avoid antiplatelet therapy given perforation, TED hose, strict I&O monitoring, daily weights  *cirrhosis secondary to hepatitis C with associated ascites, thrombocytopenia Stable  *Chronic BPH  Stable Continue alfuzosin  *Chronic benign essential hypertension Controlled on current regiment   *Chronic pain syndrome Stable Continue  methadone  Tentative discharge to home on tomorrow with home health PT and nursing   All the records are reviewed and  case discussed with Care Management/Social Workerr. Management plans discussed with the patient, family and they are in agreement.  CODE STATUS: full  TOTAL TIME TAKING CARE OF THIS PATIENT: 45 minutes.     POSSIBLE D/C IN 1 DAYS, DEPENDING ON CLINICAL CONDITION.   Avel Peace Truong Delcastillo M.D on 08/07/2017   Between 7am to 6pm - Pager - [phone]  After 6pm go to [email or web address] - password [email or web address] [facility]  Office  [phone]  CC: Primary care physician; Theotis Burrow, MD  Note: This dictation was prepared with Dragon dictation along with smaller phrase technology. Any transcriptional errors that result from this process are unintentional.

## 2017-08-07 NOTE — Progress Notes (Signed)
SURGICAL PROGRESS NOTE (cpt [phone])  Hospital Day(s): 4.   Post op day(s):  Marland Kitchen   Interval History: Patient seen and examined, no acute events or new complaints overnight. Patient reports his LLQ abdominal pain continues to improve, and he continues to describe tolerating full liquids diet with +flatus and +BM's without N/V, fever/chills, CP, or SOB. He has also been ambulating around the nurses' station/halls.  Review of Systems:  Constitutional: denies fever, chills  HEENT: denies cough or congestion  Respiratory: denies any shortness of breath  Cardiovascular: denies chest pain or palpitations  Gastrointestinal: abdominal pain, N/V, and bowel function as per interval history Genitourinary: denies burning with urination or urinary frequency Musculoskeletal: denies pain, decreased motor or sensation Integumentary: denies any other rashes or skin discolorations Neurological: denies HA or vision/hearing changes   Vital signs in last 24 hours: [min-max] current  Temp:  [98.3 F (36.8 C)-98.6 F (37 C)] 98.5 F (36.9 C) (05/30 0446) Pulse Rate:  [88-100] 90 (05/30 0446) Resp:  [17-18] 17 (05/30 0446) BP: (134-147)/(73-80) 147/80 (05/30 0446) SpO2:  [91 %-94 %] 93 % (05/30 0446) Weight:  [153 lb 3.5 oz (69.5 kg)-156 lb 4.8 oz (70.9 kg)] 153 lb 3.5 oz (69.5 kg) (05/30 0446)     Height: 5\' 10"  (177.8 cm) Weight: 153 lb 3.5 oz (69.5 kg) BMI (Calculated): 21.98   Intake/Output this shift:  No intake/output data recorded.   Intake/Output last 2 shifts:  @IOLAST2SHIFTS @   Physical Exam:  Constitutional: alert, cooperative and no distress  HENT: normocephalic without obvious abnormality  Eyes: PERRL, EOM's grossly intact and symmetric  Neuro: CN II - XII grossly intact and symmetric without deficit  Respiratory: breathing non-labored at rest  Cardiovascular: regular rate and sinus rhythm  Gastrointestinal: soft with slight LLQ tenderness to deep palpation and unchanged protuberance  without overt distention attributable to known ascites Musculoskeletal: UE and LE FROM, motor and sensation grossly intact, NT   Labs:  CBC Latest Ref Rng & Units 08/06/2017 08/04/2017 08/03/2017  WBC 3.8 - 10.6 K/uL 6.4 6.9 9.2  Hemoglobin 13.0 - 18.0 g/dL 12.3(L) 12.4(L) 13.2  Hematocrit 40.0 - 52.0 % 36.2(L) 36.4(L) 38.9(L)  Platelets 150 - 440 K/uL 43(L) 44(L) 52(L)   CMP Latest Ref Rng & Units 08/07/2017 08/06/2017 08/04/2017  Glucose 65 - 99 mg/dL 119(H) 151(H) 151(H)  BUN 6 - 20 mg/dL 12 12 27(H)  Creatinine 0.61 - 1.24 mg/dL 0.60(L) 0.58(L) 0.73  Sodium 135 - 145 mmol/L 137 135 137  Potassium 3.5 - 5.1 mmol/L 3.9 3.8 4.5  Chloride 101 - 111 mmol/L 106 104 105  CO2 22 - 32 mmol/L 24 25 24   Calcium 8.9 - 10.3 mg/dL 8.5(L) 8.3(L) 8.5(L)  Total Protein 6.5 - 8.1 g/dL - 5.3(L) -  Total Bilirubin 0.3 - 1.2 mg/dL - 2.4(H) -  Alkaline Phos 38 - 126 U/L - 73 -  AST 15 - 41 U/L - 69(H) -  ALT 17 - 63 U/L - 96(H) -   Imaging studies: No new pertinent imaging studies   Assessment/Plan: (ICD-10's: K63.1) 62 y.o. 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              -  continue full liquids diet for today until pain further improves             - continue IV antibiotics, monitor abdominal exam and bowel function             - no plans for surgery at this time considering high surgical risk and clinically improving             - medical management of patient's extensive comorbidities as per medical team             - DVT prophylaxis and  ambulation encouraged             - Zosyn to Augmentin at discharge  All of the above findings and recommendations were discussed with the patient and the medical team, and all of patient's questions were answered to his expressed satisfaction.  Thank you for the opportunity to participate in this patient's care.  -- Marilynne Drivers Rosana Hoes, MD, [place]: [facility] - Partnering for exceptional care. Office: [phone]

## 2017-08-07 NOTE — Care Management (Signed)
Notified [facility] of anticipated discharge today and attending to enter order for home health RN and PT

## 2017-08-08 LAB — GLUCOSE, CAPILLARY
GLUCOSE-CAPILLARY: 151 mg/dL — AB (ref 65–99)
GLUCOSE-CAPILLARY: 147 mg/dL — AB (ref 65–99)
Glucose-Capillary: 131 mg/dL — ABNORMAL HIGH (ref 65–99)
Glucose-Capillary: 209 mg/dL — ABNORMAL HIGH (ref 65–99)

## 2017-08-08 MED ORDER — METRONIDAZOLE 500 MG PO TABS
500.0000 mg | ORAL_TABLET | Freq: Three times a day (TID) | ORAL | Status: DC
  Administered 2017-08-08 – 2017-08-09 (×4): 500 mg via ORAL
  Filled 2017-08-08 (×4): qty 1

## 2017-08-08 MED ORDER — GUAIFENESIN-DM 100-10 MG/5ML PO SYRP
5.0000 mL | ORAL_SOLUTION | ORAL | Status: DC | PRN
  Administered 2017-08-08: 5 mL via ORAL
  Filled 2017-08-08: qty 5

## 2017-08-08 MED ORDER — AMOXICILLIN-POT CLAVULANATE 875-125 MG PO TABS
1.0000 | ORAL_TABLET | Freq: Two times a day (BID) | ORAL | Status: DC
  Administered 2017-08-08 – 2017-08-09 (×3): 1 via ORAL
  Filled 2017-08-08 (×3): qty 1

## 2017-08-08 NOTE — Progress Notes (Signed)
[facility] at [place] NAME: [name]    MR#:  [id number]  DATE OF BIRTH:  16-Nov-1955  SUBJECTIVE:  CHIEF COMPLAINT:  No chief complaint on file. Patient without complaint, no events overnight per nursing staff  REVIEW OF SYSTEMS:  CONSTITUTIONAL: No fever, fatigue or weakness.  EYES: No blurred or double vision.  EARS, NOSE, AND THROAT: No tinnitus or ear pain.  RESPIRATORY: No cough, shortness of breath, wheezing or hemoptysis.  CARDIOVASCULAR: No chest pain, orthopnea, edema.  GASTROINTESTINAL: No nausea, vomiting, diarrhea or abdominal pain.  GENITOURINARY: No dysuria, hematuria.  ENDOCRINE: No polyuria, nocturia,  HEMATOLOGY: No anemia, easy bruising or bleeding SKIN: No rash or lesion. MUSCULOSKELETAL: No joint pain or arthritis.   NEUROLOGIC: No tingling, numbness, weakness.  PSYCHIATRY: No anxiety or depression.   ROS  DRUG ALLERGIES:  No Known Allergies  VITALS:  Blood pressure (!) 141/68, pulse 91, temperature 97.8 F (36.6 C), temperature source Oral, resp. rate 17, height 5\' 10"  (1.778 m), weight 70.1 kg (154 lb 8 oz), SpO2 96 %.  PHYSICAL EXAMINATION:  GENERAL:  62 y.o.-year-old patient lying in the bed with no acute distress.  EYES: Pupils equal, round, reactive to light and accommodation. No scleral icterus. Extraocular muscles intact.  HEENT: Head atraumatic, normocephalic. Oropharynx and nasopharynx clear.  NECK:  Supple, no jugular venous distention. No thyroid enlargement, no tenderness.  LUNGS: Normal breath sounds bilaterally, no wheezing, rales,rhonchi or crepitation. No use of accessory muscles of respiration.  CARDIOVASCULAR: S1, S2 normal. No murmurs, rubs, or gallops.  ABDOMEN: Soft, nontender, nondistended. Bowel sounds present. No organomegaly or mass.  EXTREMITIES: No pedal edema, cyanosis, or clubbing.  NEUROLOGIC: Cranial nerves II through XII are intact. Muscle strength 5/5 in all extremities.  Sensation intact. Gait not checked.  PSYCHIATRIC: The patient is alert and oriented x 3.  SKIN: No obvious rash, lesion, or ulcer.   Physical Exam LABORATORY PANEL:   CBC Recent Labs  Lab 08/06/17 0403  WBC 6.4  HGB 12.3*  HCT 36.2*  PLT 43*   ------------------------------------------------------------------------------------------------------------------  Chemistries  Recent Labs  Lab 08/06/17 0403 08/07/17 0518  NA 135 137  K 3.8 3.9  CL 104 106  CO2 25 24  GLUCOSE 151* 119*  BUN 12 12  CREATININE 0.58* 0.60*  CALCIUM 8.3* 8.5*  AST 69*  --   ALT 96*  --   ALKPHOS 73  --   BILITOT 2.4*  --    ------------------------------------------------------------------------------------------------------------------  Cardiac Enzymes Recent Labs  Lab 08/03/17 2027 08/04/17 0439  TROPONINI 0.04* 0.04*   ------------------------------------------------------------------------------------------------------------------  RADIOLOGY:  No results found.  ASSESSMENT AND PLAN:  *Acute bowel perforation Etiology unknown General surgery input appreciated-nota good surgical candidate secondary to ascites givenliver cirrhosis and thrombocytopenia Continueconservative management, treated with empiric Zosyn initially-change to Augmentin/Flagyl on Aug 08, 2017 to complete another 5-day course, advance to regular diet, cleared for discharge per general surgery    *Acute diastolic congestive heart failure exacerbation Resolved Secondary to above Echocardiogram noted for normal ejection fraction and stage I diastolic dysfunction Continue Lasix, spironolactone, BB, avoid antiplatelet therapy given perforation  *cirrhosissecondary tohepatitis Cwith associatedascites, thrombocytopenia Stable  *Chronic BPH  Stable Continuealfuzosin  *Chronic benign essential hypertension Controlled on current regiment   *Chronic pain syndrome Stable Continue  methadone  Tentative discharge to home on tomorrow with home health PT and nursing in the care of family       All the records are reviewed and case  discussed with Care Management/Social Workerr. Management plans discussed with the patient, family and they are in agreement.  CODE STATUS: full  TOTAL TIME TAKING CARE OF THIS PATIENT: 35 minutes.     POSSIBLE D/C IN 1 DAYS, DEPENDING ON CLINICAL CONDITION.   [name] M.D on 08/08/2017   Between 7am to 6pm - Pager - [phone]  After 6pm go to [email or web address] - password [email or web address] [facility]  Office  [phone]  CC: Primary care physician; Theotis Burrow, MD  Note: This dictation was prepared with Dragon dictation along with smaller phrase technology. Any transcriptional errors that result from this process are unintentional.

## 2017-08-08 NOTE — Progress Notes (Signed)
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 The patient was admitted 5 days ago with a one-day history of abdominal pain. On CT scan, free air was seen in the retroperitoneum near the descending colon. The site and cause of the GI perforation was not clear. Given that the patient is a high surgical risk and that he was stable from a clinical and laboratory perspective, the decision was made to treat the patient nonoperatively with antibiotics, bowel rest, and close observation. The patient has continued to improve during his hospital stay.  Subjective: The patient has no specific complaints. He denies abdominal pain, nausea, and vomiting. He has been receiving lactulose in the hospital and having loose bowel movements. He denies fever and chills. He denies chest pain and shortness of breath. He is tolerating a solid diet.  Objective: Vitals:   08/08/17 0431 08/08/17 0438 08/08/17 0606 08/08/17 1413  BP:  140/72 (!) 141/68 119/60  Pulse:  89 91 84  Resp:  17  16  Temp:  97.8 F (36.6 C)  98.2 F (36.8 C)  TempSrc:  Oral  Oral  SpO2:  96%  94%  Weight: 70.1 kg (154 lb 8 oz)     Height:        Examination:  General exam: Appears calm and comfortable  Respiratory system: Respiratory effort normal. Cardiovascular system: RRR. No pedal edema. Gastrointestinal system: Abdomen is nondistended, soft and nontender. Extremities: No calf swelling or calf tenderness.  Data Reviewed: I have personally reviewed following labs  CBC: Recent Labs  Lab 08/03/17 2027 08/04/17 0439 08/06/17 0403  WBC 9.2 6.9 6.4  HGB 13.2 12.4* 12.3*  HCT 38.9* 36.4* 36.2*  MCV 101.5* 101.1* 101.9*  PLT 52* 44* 43*   Basic Metabolic Panel: Recent Labs  Lab 08/03/17 2027  08/04/17 0439 08/06/17 0403 08/07/17 0518  NA 133* 137 135 137  K 4.0 4.5 3.8 3.9  CL 100* 105 104 106  CO2 23 24 25 24   GLUCOSE 238* 151* 151* 119*  BUN 31* 27* 12 12  CREATININE 0.85 0.73 0.58* 0.60*  CALCIUM 9.4 8.5* 8.3* 8.5*    Scheduled Meds: . alfuzosin  10 mg Oral BID  . amoxicillin-clavulanate  1 tablet Oral Q12H  . calcium carbonate  1 tablet Oral TID  . docusate sodium  100 mg Oral BID  . furosemide  10 mg Oral Daily  . hydrALAZINE  25 mg Oral Q8H  . insulin aspart  0-5 Units Subcutaneous QHS  . insulin aspart  0-9 Units Subcutaneous TID WC  . lactulose  20 g Oral Daily  . methadone  20 mg Oral Q8H  . metoprolol tartrate  12.5 mg Oral BID  . metroNIDAZOLE  500 mg Oral Q8H  . mometasone-formoterol  2 puff Inhalation BID  . Sofosbuvir-Velpatasvir  1 tablet Oral Daily  . spironolactone  12.5 mg Oral Daily  . 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 He was admitted with possible colon perforation of unknown cause. He appears to have improved clinically. His WBC count is normal and he is afebrile. He has no abdominal tenderness and no peritoneal signs. No plans for surgery.  -  Continue heart healthy diet - Continue Augmentin at discharge - OK to discharge from a surgical perspective - Return to ED if symptoms recur   [place], DO

## 2017-08-08 NOTE — Progress Notes (Signed)
PT Cancellation Note  Patient Details Name: Mina Carlisi MRN: [id number] DOB: 03-11-1956   Cancelled Treatment:    Reason Eval/Treat Not Completed: Patient declined PT services this date, "I don't feel up to it and I've been walking a lot".  Will attempt to see pt at a future date as medically appropriate.     Linus Salmons PT, DPT 08/08/17, 2:01 PM

## 2017-08-08 NOTE — Plan of Care (Signed)

## 2017-08-09 LAB — GLUCOSE, CAPILLARY: GLUCOSE-CAPILLARY: 153 mg/dL — AB (ref 65–99)

## 2017-08-09 MED ORDER — AMOXICILLIN-POT CLAVULANATE 875-125 MG PO TABS: 1.0000 | ORAL_TABLET | Freq: Two times a day (BID) | ORAL | 0 refills | Status: AC

## 2017-08-09 MED ORDER — METRONIDAZOLE 500 MG PO TABS: 500.0000 mg | ORAL_TABLET | Freq: Three times a day (TID) | ORAL | 0 refills | Status: AC

## 2017-08-09 MED ORDER — SPIRONOLACTONE 25 MG PO TABS: 12.5000 mg | ORAL_TABLET | Freq: Every day | ORAL | 0 refills | Status: DC

## 2017-08-09 NOTE — Progress Notes (Signed)
PT Cancellation Note  Patient Details Name: [name] MRN: [id number] DOB: 1955/08/07   Cancelled Treatment:    Reason Eval/Treat Not Completed: Other (comment)   Pt up in room walking.  Dressed in street clothes.  Pt stated he is walking well on unit and feels well.  Pt began marching in room without AD "See, I'm getting my equilibrium back"  Pt voiced no further questions or concerns for therapy at this time.  Pt stated he anticipated discharge tomorrow to home.  Will discuss discharge from PT with primary PT if he remains.   Chesley Noon 08/09/2017, 9:02 AM

## 2017-08-09 NOTE — Discharge Summary (Signed)
[employer] - [facility], 62 y.o., DOB 1955/04/26, MRN [id number]. Admission date: 08/03/2017 Discharge Date 08/09/2017 Primary MD Revelo, Elyse Jarvis, MD Admitting Physician Amelia Jo, MD  Admission Diagnosis  Perforated bowel [facility]) [K63.1]  Discharge Diagnosis   Active Problems: Colonic perforation of unclear cause Acute diastolic congestive heart failure exasperation Cirrhosis secondary to hepatitis C Chronic BPH Essential hypertension Ascites related to cirrhosis Essential hypertension Chronic benign essential hypertension  Hospital Course Leibish Mcgregor  is a 62 y.o. 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  Patient presented back with abdominal pain.  In the ER he underwent work-up with CT per renal protocol which showed evidence of colonic perforation. Patient was seen by surgery due to his multiple comorbidities he was not felt not to be. surgical candidate.  Patient was treated with antibiotics and supportive care.  His abdominal pain is now resolved.  Surgery has cleared him to be discharged home. Patient also noticed to have acute on chronic diastolic congestive heart failure this was treated.    I have requested patient be seen by his primary care provider within 6 days. I will also arrange patient to be followed up by home health nursing visits for medical compliance.       Consults General surgery,   Significant Tests:  See full reports for all details    Ct Abdomen Pelvis Wo Contrast  Result Date: 07/12/2017 CLINICAL DATA:  Hyperdense foci within the rectosigmoid colon on CTA yesterday. EXAM: CT ABDOMEN AND PELVIS WITHOUT CONTRAST TECHNIQUE: Multidetector CT imaging of the abdomen and pelvis was performed following the standard protocol without IV contrast. COMPARISON:  CTA chest, abdomen, and pelvis  from yesterday. FINDINGS: Lower chest: Emphysema. Unchanged ground-glass densities within the right middle and lower lobes. Hepatobiliary: Cirrhosis. No focal liver abnormality. Small gallstones again noted. No gallbladder wall thickening or biliary dilatation. Pancreas: Mild atrophy. No ductal dilatation or surrounding inflammatory changes. Spleen: Normal in size without focal abnormality. Adrenals/Urinary Tract: The adrenal glands are unremarkable. No focal renal lesion. Small amount of residual contrast within the bilateral renal collecting systems. No hydronephrosis. Contrast within the mildly distended bladder. Stomach/Bowel: The stomach is within normal limits. No bowel obstruction. Scattered small hyperdense foci and stool throughout the rectosigmoid colon, consistent with radiopaque ingested material. No bowel wall thickening or surrounding inflammatory changes. Normal appendix. Vascular/Lymphatic: Extensive aortic atherosclerosis. Perisplenic varices again noted. No lymphadenopathy. Reproductive: Prostate is unremarkable. Other: No free fluid or pneumoperitoneum. Musculoskeletal: No acute or significant osseous findings. Stable degenerative changes of the lower lumbar spine. IMPRESSION: 1. Scattered small hyperdense foci and stool throughout the rectosigmoid colon on this noncontrast study, most consistent with radiopaque ingested material. 2. Multifocal pneumonia at the right lung base, unchanged. 3. Cirrhosis with sequelae of portal hypertension. 4. Cholelithiasis. 5.  Aortic atherosclerosis (ICD10-I70.0). 6.  Emphysema (ICD10-J43.9). Electronically Signed   By: Titus Dubin M.D.   On: 07/12/2017 10:31   Ct Angio Chest Pe W And/or Wo Contrast  Result Date: 07/21/2017 CLINICAL DATA:  History of COPD and shortness of breath EXAM: CT ANGIOGRAPHY CHEST WITH CONTRAST TECHNIQUE: Multidetector CT imaging of the chest was performed using the standard protocol during bolus administration of intravenous  contrast. Multiplanar CT image reconstructions and MIPs were obtained to evaluate the vascular anatomy. CONTRAST:  40mL ISOVUE-370 IOPAMIDOL (ISOVUE-370) INJECTION 76% COMPARISON:  Plain film from earlier in the  same day. FINDINGS: Cardiovascular: Thoracic aorta demonstrates atherosclerotic calcifications without aneurysmal dilatation or dissection. No cardiac enlargement is seen. Heavy coronary calcifications are noted. The pulmonary artery shows a normal branching pattern without definitive intraluminal filling defect. Mediastinum/Nodes: Thoracic inlet is within normal limits. Scattered small mediastinal lymph nodes are identified likely reactive in nature. Small right hilar lymph nodes are noted as well also likely reactive in nature. The esophagus is within normal limits. Lungs/Pleura: Diffuse emphysematous changes are identified within both lungs. Some patchy changes are noted in the lingula and right upper lobe as well as more marked infiltrate within the right lower lobe. Small associated right pleural effusion is noted. Mild calcified pleural plaques are seen. Upper Abdomen: The liver is diffusely nodular and somewhat shrunken in appearance consistent with underlying cirrhotic change. Multiple small gallstones are noted. Some significant inflammatory changes are noted surrounding the ascending colon which may simply be related to ascites. Correlation with any abdominal pain is recommended. CT of the abdomen and pelvis may be helpful. Musculoskeletal: Degenerative changes of the thoracic spine are noted. No acute bony abnormality is seen. Chronic compression deformities are noted stable from previous exam dating back to 2015. Review of the MIP images confirms the above findings. IMPRESSION: Diffuse emphysematous changes. Patchy infiltrates bilaterally worst in the right lower lobe consistent with multifocal pneumonia. Multiple calcified pleural plaques. Changes of cirrhosis of the liver with mild ascites.  Cholelithiasis is seen. Some increased inflammatory changes surrounding the ascending colon are noted on the lower images. This may simply be related to the underlying ascites. CT of the abdomen and pelvis may be helpful. Aortic Atherosclerosis (ICD10-I70.0) and Emphysema (ICD10-J43.9). Electronically Signed   By: Inez Catalina M.D.   On: 07/21/2017 14:55   US Abdomen Limited  Result Date: 07/21/2017 CLINICAL DATA:  62 year old with history of hep. C and cirrhosis. Patient presents with shortness of breath. Evaluate for ascites. EXAM: LIMITED ABDOMEN ULTRASOUND FOR ASCITES TECHNIQUE: Limited ultrasound survey for ascites was performed in all four abdominal quadrants. COMPARISON:  Abdominal CT 09/09/2016 FINDINGS: Trace amount of ascites in the abdomen. Bowel loops surrounding this small amount of fluid. IMPRESSION: Trace ascites.  Not enough fluid for a paracentesis. Electronically Signed   By: Markus Daft M.D.   On: 07/21/2017 16:43   US Venous Img Upper Uni Left  Result Date: 07/11/2017 CLINICAL DATA:  62 year old male with left upper extremity edema and pain. EXAM: Left UPPER EXTREMITY VENOUS DOPPLER ULTRASOUND TECHNIQUE: Gray-scale sonography with graded compression, as well as color Doppler and duplex ultrasound were performed to evaluate the upper extremity deep venous system from the level of the subclavian vein and including the jugular, axillary, basilic, radial, ulnar and upper cephalic vein. Spectral Doppler was utilized to evaluate flow at rest and with distal augmentation maneuvers. COMPARISON:  None. FINDINGS: Patient could not tolerate the study. The study had to the terminated and resumed an hour later. Contralateral Subclavian Vein: Respiratory phasicity is normal and symmetric with the symptomatic side. No evidence of thrombus. Normal compressibility. Internal Jugular Vein: No evidence of thrombus. Normal compressibility, respiratory phasicity and response to augmentation. Subclavian Vein: No  evidence of thrombus. Normal compressibility, respiratory phasicity and response to augmentation. Axillary Vein: No evidence of thrombus. Normal compressibility, respiratory phasicity and response to augmentation. Cephalic Vein: No evidence of thrombus. Normal compressibility, respiratory phasicity and response to augmentation. Basilic Vein: No evidence of thrombus. Normal compressibility, respiratory phasicity and response to augmentation. Brachial Veins: No evidence of thrombus. Normal compressibility, respiratory  phasicity and response to augmentation. Radial Veins: No evidence of thrombus. Normal compressibility, respiratory phasicity and response to augmentation. Ulnar Veins: No evidence of thrombus. Normal compressibility, respiratory phasicity and response to augmentation. Venous Reflux:  None visualized. Other Findings:  None visualized. IMPRESSION: No evidence of DVT within the left upper extremity. Electronically Signed   By: Anner Crete M.D.   On: 07/11/2017 23:27   Ct Elbow Left Wo Contrast  Result Date: 07/13/2017 CLINICAL DATA:  Painful swelling the left elbow.  Bursitis. EXAM: CT OF THE UPPER LEFT EXTREMITY WITHOUT CONTRAST TECHNIQUE: Multidetector CT imaging of the left elbow was performed according to the standard protocol. COMPARISON:  None. FINDINGS: Bones/Joint/Cartilage No evidence of acute fracture or dislocation. The joint spaces are preserved. There are no significant arthropathic changes. There is a small elbow joint effusion. Ligaments Suboptimally assessed by CT. Muscles and Tendons Unremarkable.  The biceps and triceps tendons appear. Soft tissues There is a large amount of fluid within the olecranon bursa, measuring 3.8 cm on image 21/8 and 4.4 cm on image 49/10. There are no associated calcifications in this area or erosion of the olecranon process. There is generalized edema throughout the subcutaneous fat of the distal upper arm and proximal forearm dorsally. IMPRESSION: 1.  Olecranon bursitis.  This can be a manifestation of gout. 2. Mild nonspecific edema throughout the dorsal soft tissues. 3. Small elbow joint effusion.  No acute osseous findings. Electronically Signed   By: Richardean Sale M.D.   On: 07/13/2017 13:52   Dg Chest Port 1 View  Result Date: 07/23/2017 CLINICAL DATA:  Respiratory distress and shortness of breath. EXAM: PORTABLE CHEST 1 VIEW COMPARISON:  07/21/2017 chest CT and chest radiograph. Prior studies FINDINGS: The cardiomediastinal silhouette is unremarkable. Mild pulmonary vascular congestion identified. Resolved bibasilar opacities/atelectasis noted. Emphysema again noted. No evidence of pleural effusion or pneumothorax. No acute bony abnormalities are present. IMPRESSION: 1. Resolved bibasilar opacities/atelectasis. 2. Emphysema and mild pulmonary vascular congestion. Electronically Signed   By: Margarette Canada M.D.   On: 07/23/2017 11:32   Dg Chest Port 1 View  Result Date: 07/21/2017 CLINICAL DATA:  Respiratory distress EXAM: PORTABLE CHEST 1 VIEW COMPARISON:  07/02/2017 FINDINGS: Worsening bilateral lower lobe airspace opacities, right greater than left, concerning for pneumonia. Heart is normal size. No visible effusions. No acute bony abnormality. Diffuse interstitial prominence again noted, likely chronic interstitial lung disease. IMPRESSION: Focal airspace opacities in both lower lobes, right greater than left, worsening since prior study concerning for pneumonia superimposed on chronic interstitial lung disease. Electronically Signed   By: Rolm Baptise M.D.   On: 07/21/2017 13:11   Dg Chest Port 1 View  Result Date: 07/13/2017 CLINICAL DATA:  62 year old male with history of pneumonia. EXAM: PORTABLE CHEST 1 VIEW COMPARISON:  Chest x-ray 07/11/2017.  Chest CT 07/11/2017. FINDINGS: The patchy interstitial prominence and airspace disease seen on the prior study has significantly improved, but has yet to completely resolve. The most confluent area  of residual airspace consolidation is now in the periphery of the lower left lung, likely in the lingula. No pleural effusions. No evidence of pulmonary edema. Emphysematous changes are noted throughout the lungs. Heart size is normal. Upper mediastinal contours are within normal limits. Aortic atherosclerosis. IMPRESSION: 1. Improving multilobar pneumonia, as above. 2. Aortic atherosclerosis. 3. Emphysema. Electronically Signed   By: Vinnie Langton M.D.   On: 07/13/2017 07:20   Dg Chest Port 1 View  Result Date: 07/11/2017 CLINICAL DATA:  Dyspnea.  Hypoxia.  Bronchospasm. EXAM: PORTABLE CHEST 1 VIEW COMPARISON:  None. FINDINGS: Heart size is normal. Changes COPD are noted. Right middle lobe airspace disease is present. No definite effusions are present. Atherosclerotic changes are noted at the aortic arch. IMPRESSION: 1. Right middle lobe airspace disease concerning for lobar pneumonia. 2. Changes of COPD. 3. Aortic atherosclerosis. 4. No evidence for congestive heart failure. Electronically Signed   By: San Morelle M.D.   On: 07/11/2017 20:29   Dg Abdomen Acute W/chest  Result Date: 08/03/2017 CLINICAL DATA:  Abdominal pain and constipation EXAM: DG ABDOMEN ACUTE W/ 1V CHEST COMPARISON:  Chest radiograph Jul 23, 2017; CT abdomen and pelvis September 09, 2016 FINDINGS: PA chest: There is fibrosis in the lower lung zones. There is no appreciable edema or consolidation. Heart size and pulmonary vascularity are normal. There is aortic atherosclerosis. No evident adenopathy. Supine and upright abdomen: There is stool throughout much of the colon. There is no bowel dilatation or air-fluid level to suggest bowel obstruction. No evident free air. There is aortic atherosclerosis. IMPRESSION: Stool throughout much of colon. No bowel obstruction or free air. There is fibrosis in the lower lung zones. There is no frank edema or consolidation. There is aortic atherosclerosis. Aortic Atherosclerosis (ICD10-I70.0).  Electronically Signed   By: Lowella Grip III M.D.   On: 08/03/2017 21:09   Ct Renal Stone Study  Result Date: 08/03/2017 CLINICAL DATA:  Acute onset of generalized abdominal pain. Decreased urinary output. Constipation. EXAM: CT ABDOMEN AND PELVIS WITHOUT CONTRAST TECHNIQUE: Multidetector CT imaging of the abdomen and pelvis was performed following the standard protocol without IV contrast. COMPARISON:  CT of the abdomen and pelvis from 09/09/2016 FINDINGS: Lower chest: Scarring is noted at the lung bases. Scattered coronary artery calcifications are seen. Hepatobiliary: The diffusely nodular contour of the liver is compatible with hepatic cirrhosis. Small stones are seen dependently within the gallbladder. The gallbladder is difficult to assess given surrounding ascites. The common bile duct is normal in caliber. Pancreas: The pancreas is within normal limits. Spleen: The spleen is unremarkable in appearance. Adrenals/Urinary Tract: The adrenal glands are unremarkable in appearance. The kidneys are within normal limits. There is no evidence of hydronephrosis. No renal or ureteral stones are identified. No perinephric stranding is seen. Stomach/Bowel: Scattered free air is noted tracking along the left paracolic gutter, extending anterior to Gerota's fascia on the left. Given underlying pneumatosis at the proximal descending colon, this is concerning for colonic perforation. Trace free air is also noted anteriorly underlying the abdominal wall. The visualized small bowel is grossly unremarkable, though difficult to assess given surrounding ascites. The stomach is relatively decompressed and unremarkable in appearance. The appendix is normal in caliber, without evidence of appendicitis. Vascular/Lymphatic: Diffuse calcification is seen along the abdominal aorta and its branches. The abdominal aorta is otherwise grossly unremarkable. The inferior vena cava is grossly unremarkable. No retroperitoneal  lymphadenopathy is seen. No pelvic sidewall lymphadenopathy is identified. Reproductive: The bladder is mildly distended and grossly unremarkable. The patient is status post prostatectomy. Other: Moderate to large volume ascites is noted within the abdomen and pelvis. The ascites appears slightly complex, with mild omental inflammation, raising concern for peritonitis given bowel perforation. Musculoskeletal: No acute osseous abnormalities are identified. Multilevel vacuum phenomenon is noted along the lower lumbar spine. The visualized musculature is unremarkable in appearance. IMPRESSION: 1. Free air tracking along the left paracolic gutter, extending anterior to Gerota's fascia on the left. Trace free air noted anteriorly underlying the abdominal wall. Given  pneumatosis at the proximal descending colon, this is concerning for colonic perforation. 2. Moderate to large volume ascites within the abdomen and pelvis. The ascites appears slightly complex, with mild omental inflammation. Peritonitis cannot be excluded given underlying bowel perforation. 3. Findings of hepatic cirrhosis. 4. Cholelithiasis. Gallbladder not well assessed given underlying ascites. 5. Scattered coronary artery calcifications. Aortic Atherosclerosis (ICD10-I70.0). Critical Value/emergent results were called by telephone at the time of interpretation on 08/03/2017 at 9:54 pm to Dr. Harvest Dark, who verbally acknowledged these results. Electronically Signed   By: Garald Balding M.D.   On: 08/03/2017 21:57   Ct Angio Chest/abd/pel For Dissection W And/or Wo Contrast  Result Date: 07/11/2017 CLINICAL DATA:  Cyanotic with bronchospasm EXAM: CT ANGIOGRAPHY CHEST, ABDOMEN AND PELVIS TECHNIQUE: Multidetector CT imaging through the chest, abdomen and pelvis was performed using the standard protocol during bolus administration of intravenous contrast. Multiplanar reconstructed images and MIPs were obtained and reviewed to evaluate the vascular  anatomy. CONTRAST:  131mL ISOVUE-370 IOPAMIDOL (ISOVUE-370) INJECTION 76% COMPARISON:  Chest x-ray 07/11/2017, 07/02/2017, CT 09/09/2016, MRI 08/04/2015 FINDINGS: CTA CHEST FINDINGS Cardiovascular: Non contrasted images of the chest demonstrate no intramural hematoma. Moderate aortic atherosclerosis. Coronary vascular calcification. Normal heart size. No pericardial effusion. No dissection is seen.  No aneurysm. Mediastinum/Nodes: Midline trachea. No thyroid mass. Prominent subcarinal lymph node measuring 13 mm. No significantly enlarged hilar nodes. Esophagus within normal limits. Lungs/Pleura: Severe emphysema. Multifocal consolidations and ground-glass densities within the right lower lobe, right middle lobe and bilateral upper lobes. Small posterior calcified pleural plaques. No significant effusion. No pneumothorax. Musculoskeletal: Chronic compression deformities at T6, T7, T8 and T11. No suspicious bone lesion. Review of the MIP images confirms the above findings. CTA ABDOMEN AND PELVIS FINDINGS VASCULAR Aorta: No aneurysm or dissection. Moderate severe aortic atherosclerosis. Celiac: Moderate stenosis at the origin.  Distal vascular patency. SMA: No significant stenosis.  Distal vascular patency. Renals: Single right and single left renal arteries. No significant stenosis on the left. Mild stenosis at the origin of the right renal artery. IMA: Calcified origin.  Distal vascular patency. Inflow: Moderate severe diffuse atherosclerotic vascular disease of the aortoiliac bifurcation. Mild disease of the left external iliac and common femoral arteries without occlusion or high-grade stenosis. Moderate diffuse disease of the right external iliac without occlusion. Perisplenic varices. Review of the MIP images confirms the above findings. NON-VASCULAR Hepatobiliary: Cirrhotic morphology of the liver. Multiple calcified gallstones. No biliary dilatation Pancreas: Unremarkable. No pancreatic ductal dilatation or  surrounding inflammatory changes. Spleen: Normal in size without focal abnormality. Adrenals/Urinary Tract: Adrenal glands are within normal limits. No hydronephrosis. Bladder normal. Stomach/Bowel: Nonenlarged stomach. No dilated small bowel. Hyperdense foci within the rectosigmoid colon. No colon wall thickening. Lymphatic: No significantly enlarged lymph nodes. Reproductive: Prostate is unremarkable. Other: Negative for free air or free fluid. Musculoskeletal: Moderate severe degenerative changes L3 through S1. Review of the MIP images confirms the above findings. IMPRESSION: 1. Negative for aortic aneurysm or dissection. 2. Severe emphysema. Multifocal ground-glass densities and consolidations, most suspicious for multifocal pneumonia. 3. Small hyperdense foci within the rectosigmoid colon, favor radiopaque ingested material over small foci of hemorrhage but cannot be definitive in the absence of non contrasted images through this region. 4. Cirrhosis of the liver with evidence of portal hypertension 5. Gallstones Electronically Signed   By: Donavan Foil M.D.   On: 07/11/2017 22:54       Today   Subjective:   Quade Ramirez patient doing much better wants to go  home Objective:   Blood pressure 125/69, pulse 84, temperature 98.7 F (37.1 C), temperature source Oral, resp. rate 18, height 5\' 10"  (1.778 m), weight 70.1 kg (154 lb 8 oz), SpO2 94 %.  .  Intake/Output Summary (Last 24 hours) at 08/09/2017 1344 Last data filed at 08/08/2017 1700 Gross per 24 hour  Intake 240 ml  Output -  Net 240 ml    Exam VITAL SIGNS: Blood pressure 125/69, pulse 84, temperature 98.7 F (37.1 C), temperature source Oral, resp. rate 18, height 5\' 10"  (1.778 m), weight 70.1 kg (154 lb 8 oz), SpO2 94 %.  GENERAL:  62 y.o.-year-old patient lying in the bed with no acute distress.  EYES: Pupils equal, round, reactive to light and accommodation. No scleral icterus. Extraocular muscles intact.  HEENT: Head  atraumatic, normocephalic. Oropharynx and nasopharynx clear.  NECK:  Supple, no jugular venous distention. No thyroid enlargement, no tenderness.  LUNGS: Normal breath sounds bilaterally, no wheezing, rales,rhonchi or crepitation. No use of accessory muscles of respiration.  CARDIOVASCULAR: S1, S2 normal. No murmurs, rubs, or gallops.  ABDOMEN: Soft, nontender, nondistended. Bowel sounds present. No organomegaly or mass.  EXTREMITIES: No pedal edema, cyanosis, or clubbing.  NEUROLOGIC: Cranial nerves II through XII are intact. Muscle strength 5/5 in all extremities. Sensation intact. Gait not checked.  PSYCHIATRIC: The patient is alert and oriented x 3.  SKIN: No obvious rash, lesion, or ulcer.   Data Review     CBC w Diff:  Lab Results  Component Value Date   WBC 6.4 08/06/2017   HGB 12.3 (L) 08/06/2017   HGB 13.0 06/14/2014   HCT 36.2 (L) 08/06/2017   HCT 39.1 (L) 06/14/2014   PLT 43 (L) 08/06/2017   PLT 112 (L) 06/14/2014   LYMPHOPCT 16 07/21/2017   LYMPHOPCT 12.1 06/14/2014   MONOPCT 9 07/21/2017   MONOPCT 1.6 06/14/2014   EOSPCT 2 07/21/2017   EOSPCT 0.0 06/14/2014   BASOPCT 1 07/21/2017   BASOPCT 0.2 06/14/2014   CMP:  Lab Results  Component Value Date   NA 137 08/07/2017   NA 136 06/14/2014   K 3.9 08/07/2017   K 3.3 (L) 06/14/2014   CL 106 08/07/2017   CL 102 06/14/2014   CO2 24 08/07/2017   CO2 25 06/14/2014   BUN 12 08/07/2017   BUN 29 (H) 06/14/2014   CREATININE 0.60 (L) 08/07/2017   CREATININE 0.80 06/14/2014   PROT 5.3 (L) 08/06/2017   PROT 6.2 (L) 06/14/2014   ALBUMIN 2.7 (L) 08/06/2017   ALBUMIN 2.6 (L) 06/14/2014   BILITOT 2.4 (H) 08/06/2017   BILITOT 0.6 06/14/2014   ALKPHOS 73 08/06/2017   ALKPHOS 105 06/14/2014   AST 69 (H) 08/06/2017   AST 107 (H) 06/14/2014   ALT 96 (H) 08/06/2017   ALT 137 (H) 06/14/2014  .  Micro Results No results found for this or any previous visit (from the past 240 hour(s)).      Code Status Orders   (From admission, onward)        Start     Ordered   08/04/17 0055  Full code  Continuous     08/04/17 0054    Code Status History    Date Active Date Inactive Code Status Order ID Comments User Context   07/21/2017 2303 07/26/2017 1802 Full Code [id number]  Bettey Costa, MD Inpatient   07/12/2017 0139 07/15/2017 1916 Full Code [id number]  Lance Coon, MD Inpatient   12/21/2016 2159 12/23/2016 1715 Full Code [id number]  Idelle Crouch, MD Inpatient   09/09/2016 2250 09/16/2016 1727 Full Code [id number]  Gladstone Lighter, MD Inpatient   09/09/2016 1934 09/09/2016 2250 Full Code [id number]  Nena Polio, MD ED   09/03/2016 2152 09/05/2016 1844 Full Code [id number]  Henreitta Leber, MD Inpatient   11/06/2014 0551 11/07/2014 1818 Full Code [id number]  Juluis Mire, MD Inpatient   08/10/2014 2023 08/12/2014 1623 Full Code [id number]  Theodoro Grist, MD Inpatient          Follow-up Information    [facility]. Schedule an appointment as soon as possible for a visit in 2 weeks.   Specialty:  General Surgery Why:  Office Closed Contact information: [address] [place] [phone]       Revelo, Elyse Jarvis, MD In 6 days.   Specialty:  Family Medicine Why:  hospital f/u // Office Closed  Contact information: [place] [address] [phone]           Discharge Medications   Allergies as of 08/09/2017   No Known Allergies     Medication List    STOP taking these medications   lactulose 10 GM/15ML solution Commonly known as:  CHRONULAC   potassium chloride SA 20 MEQ tablet Commonly known as:  K-DUR,KLOR-CON   predniSONE 10 MG (21) Tbpk tablet Commonly known as:  STERAPRED UNI-PAK 21 TAB     TAKE these medications   albuterol 108 (90 Base) MCG/ACT inhaler Commonly known as:  PROVENTIL HFA;VENTOLIN HFA Inhale 2-4 puffs by mouth every 4 hours as needed for wheezing, cough, and/or  shortness of breath   alfuzosin 10 MG 24 hr tablet Commonly known as:  UROXATRAL Take 10 mg by mouth 2 (two) times daily.   ALPRAZolam 1 MG tablet Commonly known as:  XANAX Take 1 mg by mouth 3 (three) times daily as needed for anxiety.   amoxicillin-clavulanate 875-125 MG tablet Commonly known as:  AUGMENTIN Take 1 tablet by mouth every 12 (twelve) hours for 5 days.   budesonide-formoterol 160-4.5 MCG/ACT inhaler Commonly known as:  SYMBICORT Inhale 2 puffs into the lungs 2 (two) times daily.   EPCLUSA 400-100 MG Tabs Generic drug:  Sofosbuvir-Velpatasvir Take 1 tablet by mouth daily.   furosemide 20 MG tablet Commonly known as:  LASIX Take 1 tablet (20 mg total) by mouth daily.   hydrALAZINE 25 MG tablet Commonly known as:  APRESOLINE Take 1 tablet (25 mg total) by mouth every 8 (eight) hours.   ipratropium-albuterol 0.5-2.5 (3) MG/3ML Soln Commonly known as:  DUONEB Take 3 mLs by nebulization every 6 (six) hours as needed (wheezing, shortness of breath).   metFORMIN 500 MG tablet Commonly known as:  GLUCOPHAGE Take 1 tablet (500 mg total) by mouth 2 (two) times daily with a meal.   methadone 10 MG tablet Commonly known as:  DOLOPHINE Take 20 mg by mouth every 8 (eight) hours.   metroNIDAZOLE 500 MG tablet Commonly known as:  FLAGYL Take 1 tablet (500 mg total) by mouth every 8 (eight) hours for 5 days.   rifaximin 550 MG Tabs tablet Commonly known as:  XIFAXAN Take 1 tablet (550 mg total) by mouth 2 (two) times daily.   spironolactone 25 MG tablet Commonly known as:  ALDACTONE Take 0.5 tablets (12.5 mg total) by mouth daily. Start taking on:  08/10/2017   tiotropium 18 MCG inhalation capsule Commonly known as:  SPIRIVA Place 18 mcg into inhaler and  inhale daily.          Total Time in preparing paper work, data evaluation and todays exam - 30 minutes  Dustin Flock M.D on 08/09/2017 at [place]  [phone]

## 2017-08-09 NOTE — Progress Notes (Signed)
Returned pt's personal home medication, [name], that was previously locked up in the [facility] [place].

## 2017-08-09 NOTE — Care Management (Signed)
Discharge to home today per Dr. Posey Pronto. Agreed to have [facility] to come to his home. Brenton Grills, [facility] representative updated. Sister will transport Shelbie Ammons RN MSN CCM Care Management [phone]

## 2017-09-02 ENCOUNTER — Other Ambulatory Visit: Payer: Self-pay | Admitting: Gastroenterology

## 2017-09-02 DIAGNOSIS — K7460 Unspecified cirrhosis of liver: Secondary | ICD-10-CM

## 2017-09-09 ENCOUNTER — Ambulatory Visit: Payer: Medicaid Other

## 2018-03-12 ENCOUNTER — Inpatient Hospital Stay
Admission: EM | Admit: 2018-03-12 | Discharge: 2018-03-16 | DRG: 193 | Disposition: A | Discharge status: Home / Self Care | Payer: Medicaid Other | Attending: Internal Medicine | Admitting: Internal Medicine

## 2018-03-12 ENCOUNTER — Emergency Department: Payer: Medicaid Other

## 2018-03-12 ENCOUNTER — Other Ambulatory Visit: Payer: Self-pay

## 2018-03-12 ENCOUNTER — Encounter: Payer: Self-pay | Admitting: Emergency Medicine

## 2018-03-12 DIAGNOSIS — Z681 Body mass index (BMI) 19 or less, adult: Secondary | ICD-10-CM

## 2018-03-12 DIAGNOSIS — Z7951 Long term (current) use of inhaled steroids: Secondary | ICD-10-CM

## 2018-03-12 DIAGNOSIS — D6959 Other secondary thrombocytopenia: Secondary | ICD-10-CM | POA: Diagnosis present

## 2018-03-12 DIAGNOSIS — M549 Dorsalgia, unspecified: Secondary | ICD-10-CM

## 2018-03-12 DIAGNOSIS — Z9841 Cataract extraction status, right eye: Secondary | ICD-10-CM

## 2018-03-12 DIAGNOSIS — Z87891 Personal history of nicotine dependence: Secondary | ICD-10-CM

## 2018-03-12 DIAGNOSIS — I10 Essential (primary) hypertension: Secondary | ICD-10-CM | POA: Diagnosis present

## 2018-03-12 DIAGNOSIS — N400 Enlarged prostate without lower urinary tract symptoms: Secondary | ICD-10-CM | POA: Diagnosis present

## 2018-03-12 DIAGNOSIS — Z961 Presence of intraocular lens: Secondary | ICD-10-CM | POA: Diagnosis present

## 2018-03-12 DIAGNOSIS — E43 Unspecified severe protein-calorie malnutrition: Secondary | ICD-10-CM

## 2018-03-12 DIAGNOSIS — Z933 Colostomy status: Secondary | ICD-10-CM

## 2018-03-12 DIAGNOSIS — G8929 Other chronic pain: Secondary | ICD-10-CM | POA: Diagnosis present

## 2018-03-12 DIAGNOSIS — E119 Type 2 diabetes mellitus without complications: Secondary | ICD-10-CM | POA: Diagnosis present

## 2018-03-12 DIAGNOSIS — Z9842 Cataract extraction status, left eye: Secondary | ICD-10-CM

## 2018-03-12 DIAGNOSIS — E872 Acidosis: Secondary | ICD-10-CM | POA: Diagnosis present

## 2018-03-12 DIAGNOSIS — Z79899 Other long term (current) drug therapy: Secondary | ICD-10-CM

## 2018-03-12 DIAGNOSIS — J440 Chronic obstructive pulmonary disease with acute lower respiratory infection: Secondary | ICD-10-CM | POA: Diagnosis present

## 2018-03-12 DIAGNOSIS — F329 Major depressive disorder, single episode, unspecified: Secondary | ICD-10-CM | POA: Diagnosis present

## 2018-03-12 DIAGNOSIS — Z79891 Long term (current) use of opiate analgesic: Secondary | ICD-10-CM

## 2018-03-12 DIAGNOSIS — Z9981 Dependence on supplemental oxygen: Secondary | ICD-10-CM

## 2018-03-12 DIAGNOSIS — J9621 Acute and chronic respiratory failure with hypoxia: Secondary | ICD-10-CM | POA: Diagnosis present

## 2018-03-12 DIAGNOSIS — M4854XA Collapsed vertebra, not elsewhere classified, thoracic region, initial encounter for fracture: Secondary | ICD-10-CM | POA: Diagnosis present

## 2018-03-12 DIAGNOSIS — J189 Pneumonia, unspecified organism: Secondary | ICD-10-CM

## 2018-03-12 DIAGNOSIS — F419 Anxiety disorder, unspecified: Secondary | ICD-10-CM | POA: Diagnosis present

## 2018-03-12 DIAGNOSIS — K7460 Unspecified cirrhosis of liver: Secondary | ICD-10-CM | POA: Diagnosis present

## 2018-03-12 DIAGNOSIS — K219 Gastro-esophageal reflux disease without esophagitis: Secondary | ICD-10-CM | POA: Diagnosis present

## 2018-03-12 DIAGNOSIS — R569 Unspecified convulsions: Secondary | ICD-10-CM

## 2018-03-12 DIAGNOSIS — B182 Chronic viral hepatitis C: Secondary | ICD-10-CM | POA: Diagnosis present

## 2018-03-12 LAB — CBC
HCT: 49.3 % (ref 39.0–52.0)
Hemoglobin: 16 g/dL (ref 13.0–17.0)
MCH: 32.3 pg (ref 26.0–34.0)
MCHC: 32.5 g/dL (ref 30.0–36.0)
MCV: 99.4 fL (ref 80.0–100.0)
NRBC: 0 % (ref 0.0–0.2)
PLATELETS: 118 10*3/uL — AB (ref 150–400)
RBC: 4.96 MIL/uL (ref 4.22–5.81)
RDW: 12.9 % (ref 11.5–15.5)
WBC: 14.4 10*3/uL — ABNORMAL HIGH (ref 4.0–10.5)

## 2018-03-12 LAB — BASIC METABOLIC PANEL
ANION GAP: 16 — AB (ref 5–15)
BUN: 22 mg/dL (ref 8–23)
CALCIUM: 9.5 mg/dL (ref 8.9–10.3)
CHLORIDE: 105 mmol/L (ref 98–111)
CO2: 16 mmol/L — ABNORMAL LOW (ref 22–32)
Creatinine, Ser: 1.3 mg/dL — ABNORMAL HIGH (ref 0.61–1.24)
GFR calc non Af Amer: 58 mL/min — ABNORMAL LOW (ref >60)
Glucose, Bld: 154 mg/dL — ABNORMAL HIGH (ref 70–99)
Potassium: 4 mmol/L (ref 3.5–5.1)
SODIUM: 137 mmol/L (ref 135–145)

## 2018-03-12 LAB — TROPONIN I: Troponin I: <0.03 ng/mL (ref <0.03)

## 2018-03-12 MED ORDER — SODIUM CHLORIDE 0.9 % IV SOLN
500.0000 mg | Freq: Once | INTRAVENOUS | Status: AC
  Administered 2018-03-13: 500 mg via INTRAVENOUS
  Filled 2018-03-12: qty 500

## 2018-03-12 MED ORDER — ALPRAZOLAM 0.5 MG PO TABS
1.0000 mg | ORAL_TABLET | Freq: Once | ORAL | Status: AC
  Administered 2018-03-12: 1 mg via ORAL
  Filled 2018-03-12: qty 2

## 2018-03-12 MED ORDER — SODIUM CHLORIDE 0.9 % IV SOLN
1.0000 g | Freq: Once | INTRAVENOUS | Status: AC
  Administered 2018-03-12: 1 g via INTRAVENOUS
  Filled 2018-03-12: qty 10

## 2018-03-12 MED ORDER — SODIUM CHLORIDE 0.9 % IV BOLUS
1000.0000 mL | Freq: Once | INTRAVENOUS | Status: AC
  Administered 2018-03-12: 1000 mL via INTRAVENOUS

## 2018-03-12 NOTE — ED Notes (Signed)
Caryl Pina, RN called lab to draw blood cultures due to hard stick/multiple sticks previous.

## 2018-03-12 NOTE — ED Triage Notes (Signed)
Pt in via [facility] from home; per EMS, pt with possible seizure, EMS reports agitation and altered mental status on scene.  Pt A/Ox3, tachycardic upon arrival, other vitals WDL.  NAD noted at this time.

## 2018-03-12 NOTE — ED Notes (Signed)
Patient transported to X-ray 

## 2018-03-12 NOTE — ED Notes (Signed)
Pt refused to have lab continue or anyone else to draw for cultures. No cultures obtained due to refusal. MD made aware.

## 2018-03-12 NOTE — ED Notes (Signed)
Pt oxygen saturation dropped to 88% on room air; pt placed on 2L nasal cannula to maintain saturation >90%.

## 2018-03-13 ENCOUNTER — Inpatient Hospital Stay: Payer: Self-pay

## 2018-03-13 ENCOUNTER — Inpatient Hospital Stay: Payer: Medicaid Other

## 2018-03-13 DIAGNOSIS — Z9841 Cataract extraction status, right eye: Secondary | ICD-10-CM | POA: Diagnosis not present

## 2018-03-13 DIAGNOSIS — Z79899 Other long term (current) drug therapy: Secondary | ICD-10-CM | POA: Diagnosis not present

## 2018-03-13 DIAGNOSIS — N400 Enlarged prostate without lower urinary tract symptoms: Secondary | ICD-10-CM | POA: Diagnosis present

## 2018-03-13 DIAGNOSIS — K7460 Unspecified cirrhosis of liver: Secondary | ICD-10-CM | POA: Diagnosis present

## 2018-03-13 DIAGNOSIS — J9621 Acute and chronic respiratory failure with hypoxia: Secondary | ICD-10-CM | POA: Diagnosis present

## 2018-03-13 DIAGNOSIS — E43 Unspecified severe protein-calorie malnutrition: Secondary | ICD-10-CM

## 2018-03-13 DIAGNOSIS — M4854XA Collapsed vertebra, not elsewhere classified, thoracic region, initial encounter for fracture: Secondary | ICD-10-CM | POA: Diagnosis present

## 2018-03-13 DIAGNOSIS — B182 Chronic viral hepatitis C: Secondary | ICD-10-CM | POA: Diagnosis present

## 2018-03-13 DIAGNOSIS — E119 Type 2 diabetes mellitus without complications: Secondary | ICD-10-CM | POA: Diagnosis present

## 2018-03-13 DIAGNOSIS — Z9842 Cataract extraction status, left eye: Secondary | ICD-10-CM | POA: Diagnosis not present

## 2018-03-13 DIAGNOSIS — R0603 Acute respiratory distress: Secondary | ICD-10-CM

## 2018-03-13 DIAGNOSIS — Z9981 Dependence on supplemental oxygen: Secondary | ICD-10-CM | POA: Diagnosis not present

## 2018-03-13 DIAGNOSIS — E872 Acidosis: Secondary | ICD-10-CM | POA: Diagnosis present

## 2018-03-13 DIAGNOSIS — K219 Gastro-esophageal reflux disease without esophagitis: Secondary | ICD-10-CM | POA: Diagnosis present

## 2018-03-13 DIAGNOSIS — Z961 Presence of intraocular lens: Secondary | ICD-10-CM | POA: Diagnosis present

## 2018-03-13 DIAGNOSIS — J189 Pneumonia, unspecified organism: Secondary | ICD-10-CM | POA: Diagnosis not present

## 2018-03-13 DIAGNOSIS — R569 Unspecified convulsions: Secondary | ICD-10-CM | POA: Diagnosis not present

## 2018-03-13 DIAGNOSIS — Z87891 Personal history of nicotine dependence: Secondary | ICD-10-CM | POA: Diagnosis not present

## 2018-03-13 DIAGNOSIS — G8929 Other chronic pain: Secondary | ICD-10-CM | POA: Diagnosis present

## 2018-03-13 DIAGNOSIS — F419 Anxiety disorder, unspecified: Secondary | ICD-10-CM | POA: Diagnosis present

## 2018-03-13 DIAGNOSIS — F329 Major depressive disorder, single episode, unspecified: Secondary | ICD-10-CM | POA: Diagnosis present

## 2018-03-13 DIAGNOSIS — I10 Essential (primary) hypertension: Secondary | ICD-10-CM | POA: Diagnosis present

## 2018-03-13 DIAGNOSIS — D6959 Other secondary thrombocytopenia: Secondary | ICD-10-CM | POA: Diagnosis present

## 2018-03-13 DIAGNOSIS — J440 Chronic obstructive pulmonary disease with acute lower respiratory infection: Secondary | ICD-10-CM | POA: Diagnosis present

## 2018-03-13 DIAGNOSIS — Z681 Body mass index (BMI) 19 or less, adult: Secondary | ICD-10-CM | POA: Diagnosis not present

## 2018-03-13 LAB — CREATININE, SERUM
Creatinine, Ser: 0.97 mg/dL (ref 0.61–1.24)
GFR calc Af Amer: >60 mL/min (ref >60)
GFR calc non Af Amer: >60 mL/min (ref >60)

## 2018-03-13 LAB — GLUCOSE, CAPILLARY
Glucose-Capillary: 158 mg/dL — ABNORMAL HIGH (ref 70–99)
Glucose-Capillary: 354 mg/dL — ABNORMAL HIGH (ref 70–99)
Glucose-Capillary: 448 mg/dL — ABNORMAL HIGH (ref 70–99)
Glucose-Capillary: 228 mg/dL — ABNORMAL HIGH (ref 70–99)

## 2018-03-13 LAB — URINALYSIS, COMPLETE (UACMP) WITH MICROSCOPIC
Bacteria, UA: NONE SEEN
Bilirubin Urine: NEGATIVE
Glucose, UA: NEGATIVE mg/dL
Hgb urine dipstick: NEGATIVE
Ketones, ur: NEGATIVE mg/dL
Leukocytes, UA: NEGATIVE
Nitrite: NEGATIVE
Protein, ur: NEGATIVE mg/dL
Specific Gravity, Urine: 1.018 (ref 1.005–1.030)
pH: 6 (ref 5.0–8.0)

## 2018-03-13 LAB — CBC
HEMATOCRIT: 39.6 % (ref 39.0–52.0)
Hemoglobin: 13.3 g/dL (ref 13.0–17.0)
MCH: 32.1 pg (ref 26.0–34.0)
MCHC: 33.6 g/dL (ref 30.0–36.0)
MCV: 95.7 fL (ref 80.0–100.0)
Platelets: 87 10*3/uL — ABNORMAL LOW (ref 150–400)
RBC: 4.14 MIL/uL — ABNORMAL LOW (ref 4.22–5.81)
RDW: 12.9 % (ref 11.5–15.5)
WBC: 7.1 10*3/uL (ref 4.0–10.5)
nRBC: 0 % (ref 0.0–0.2)

## 2018-03-13 LAB — MRSA PCR SCREENING: MRSA by PCR: NEGATIVE

## 2018-03-13 MED ORDER — SPIRONOLACTONE 25 MG PO TABS
50.0000 mg | ORAL_TABLET | Freq: Every day | ORAL | Status: DC
  Administered 2018-03-14 – 2018-03-16 (×3): 50 mg via ORAL
  Filled 2018-03-13 (×3): qty 2

## 2018-03-13 MED ORDER — METHYLPREDNISOLONE SODIUM SUCC 125 MG IJ SOLR
INTRAMUSCULAR | Status: AC
  Administered 2018-03-13: 125 mg via INTRAVENOUS
  Filled 2018-03-13: qty 2

## 2018-03-13 MED ORDER — FLUTICASONE PROPIONATE 50 MCG/ACT NA SUSP
2.0000 | Freq: Every day | NASAL | Status: DC
  Administered 2018-03-13 – 2018-03-16 (×4): 2 via NASAL
  Filled 2018-03-13 (×2): qty 16

## 2018-03-13 MED ORDER — SODIUM CHLORIDE 0.9% FLUSH
10.0000 mL | Freq: Two times a day (BID) | INTRAVENOUS | Status: DC
  Administered 2018-03-13: 10 mL
  Administered 2018-03-14: 40 mL
  Administered 2018-03-14 – 2018-03-15 (×3): 10 mL

## 2018-03-13 MED ORDER — RIFAXIMIN 550 MG PO TABS
550.0000 mg | ORAL_TABLET | Freq: Two times a day (BID) | ORAL | Status: DC
  Administered 2018-03-13 – 2018-03-16 (×7): 550 mg via ORAL
  Filled 2018-03-13 (×9): qty 1

## 2018-03-13 MED ORDER — IPRATROPIUM-ALBUTEROL 0.5-2.5 (3) MG/3ML IN SOLN
3.0000 mL | Freq: Once | RESPIRATORY_TRACT | Status: AC
  Administered 2018-03-13: 3 mL via RESPIRATORY_TRACT

## 2018-03-13 MED ORDER — METHYLPREDNISOLONE SODIUM SUCC 125 MG IJ SOLR
125.0000 mg | Freq: Once | INTRAMUSCULAR | Status: AC
  Administered 2018-03-13: 125 mg via INTRAVENOUS

## 2018-03-13 MED ORDER — AZITHROMYCIN 500 MG PO TABS
500.0000 mg | ORAL_TABLET | ORAL | Status: DC
  Administered 2018-03-14: 500 mg via ORAL
  Filled 2018-03-13: qty 1

## 2018-03-13 MED ORDER — SPIRONOLACTONE 25 MG PO TABS
12.5000 mg | ORAL_TABLET | Freq: Every day | ORAL | Status: DC
  Administered 2018-03-13: 12.5 mg via ORAL
  Filled 2018-03-13: qty 1
  Filled 2018-03-13: qty 0.5

## 2018-03-13 MED ORDER — PREDNISONE 20 MG PO TABS: 40.0000 mg | ORAL_TABLET | Freq: Every day | ORAL | Status: DC

## 2018-03-13 MED ORDER — FUROSEMIDE 20 MG PO TABS
20.0000 mg | ORAL_TABLET | Freq: Every day | ORAL | Status: DC
  Administered 2018-03-13 – 2018-03-16 (×4): 20 mg via ORAL
  Filled 2018-03-13 (×4): qty 1

## 2018-03-13 MED ORDER — INSULIN ASPART 100 UNIT/ML ~~LOC~~ SOLN
0.0000 [IU] | Freq: Every day | SUBCUTANEOUS | Status: DC
  Administered 2018-03-13: 3 [IU] via SUBCUTANEOUS
  Administered 2018-03-14 – 2018-03-15 (×2): 2 [IU] via SUBCUTANEOUS
  Filled 2018-03-13 (×3): qty 1

## 2018-03-13 MED ORDER — ENOXAPARIN SODIUM 40 MG/0.4ML ~~LOC~~ SOLN
40.0000 mg | SUBCUTANEOUS | Status: DC
  Administered 2018-03-14: 40 mg via SUBCUTANEOUS
  Filled 2018-03-13: qty 0.4

## 2018-03-13 MED ORDER — GUAIFENESIN ER 600 MG PO TB12
600.0000 mg | ORAL_TABLET | Freq: Two times a day (BID) | ORAL | Status: DC
  Administered 2018-03-13 – 2018-03-14 (×2): 600 mg via ORAL
  Filled 2018-03-13 (×9): qty 1

## 2018-03-13 MED ORDER — INSULIN ASPART 100 UNIT/ML ~~LOC~~ SOLN
0.0000 [IU] | Freq: Three times a day (TID) | SUBCUTANEOUS | Status: DC
  Administered 2018-03-13 (×2): 9 [IU] via SUBCUTANEOUS
  Administered 2018-03-14: 5 [IU] via SUBCUTANEOUS
  Administered 2018-03-14: 16 [IU] via SUBCUTANEOUS
  Administered 2018-03-14: 5 [IU] via SUBCUTANEOUS
  Administered 2018-03-15: 3 [IU] via SUBCUTANEOUS
  Administered 2018-03-15 (×2): 2 [IU] via SUBCUTANEOUS
  Administered 2018-03-16: 5 [IU] via SUBCUTANEOUS
  Filled 2018-03-13 (×9): qty 1

## 2018-03-13 MED ORDER — LORAZEPAM 2 MG/ML IJ SOLN
1.0000 mg | INTRAMUSCULAR | Status: DC | PRN
  Administered 2018-03-13: 1 mg via INTRAVENOUS
  Filled 2018-03-13: qty 1

## 2018-03-13 MED ORDER — ALFUZOSIN HCL ER 10 MG PO TB24
10.0000 mg | ORAL_TABLET | Freq: Two times a day (BID) | ORAL | Status: DC
  Administered 2018-03-13 – 2018-03-16 (×7): 10 mg via ORAL
  Filled 2018-03-13 (×11): qty 1

## 2018-03-13 MED ORDER — SODIUM CHLORIDE 0.9% FLUSH: 10.0000 mL | INTRAVENOUS | Status: DC | PRN

## 2018-03-13 MED ORDER — SOFOSBUVIR-VELPATASVIR 400-100 MG PO TABS: 1.0000 | ORAL_TABLET | Freq: Every day | ORAL | Status: DC

## 2018-03-13 MED ORDER — METHYLPREDNISOLONE SODIUM SUCC 125 MG IJ SOLR
60.0000 mg | Freq: Two times a day (BID) | INTRAMUSCULAR | Status: DC
  Administered 2018-03-13 – 2018-03-14 (×2): 60 mg via INTRAVENOUS
  Filled 2018-03-13 (×2): qty 2

## 2018-03-13 MED ORDER — ENSURE ENLIVE PO LIQD
237.0000 mL | Freq: Three times a day (TID) | ORAL | Status: DC
  Administered 2018-03-13 – 2018-03-16 (×8): 237 mL via ORAL

## 2018-03-13 MED ORDER — VITAMIN C 500 MG PO TABS
250.0000 mg | ORAL_TABLET | Freq: Two times a day (BID) | ORAL | Status: DC
  Administered 2018-03-13 – 2018-03-16 (×6): 250 mg via ORAL
  Filled 2018-03-13 (×2): qty 0.5
  Filled 2018-03-13: qty 1
  Filled 2018-03-13 (×2): qty 0.5
  Filled 2018-03-13 (×3): qty 1

## 2018-03-13 MED ORDER — SODIUM CHLORIDE 0.9 % IV SOLN
1.0000 g | INTRAVENOUS | Status: DC
  Administered 2018-03-14: 1 g via INTRAVENOUS
  Filled 2018-03-13 (×2): qty 10

## 2018-03-13 MED ORDER — MOMETASONE FURO-FORMOTEROL FUM 200-5 MCG/ACT IN AERO
2.0000 | INHALATION_SPRAY | Freq: Two times a day (BID) | RESPIRATORY_TRACT | Status: DC
  Administered 2018-03-13 – 2018-03-16 (×7): 2 via RESPIRATORY_TRACT
  Filled 2018-03-13: qty 8.8

## 2018-03-13 MED ORDER — CARVEDILOL 3.125 MG PO TABS
6.2500 mg | ORAL_TABLET | Freq: Two times a day (BID) | ORAL | Status: DC
  Administered 2018-03-13 – 2018-03-16 (×7): 6.25 mg via ORAL
  Filled 2018-03-13: qty 2
  Filled 2018-03-13 (×2): qty 1
  Filled 2018-03-13 (×3): qty 2
  Filled 2018-03-13: qty 1

## 2018-03-13 MED ORDER — IPRATROPIUM-ALBUTEROL 0.5-2.5 (3) MG/3ML IN SOLN
3.0000 mL | Freq: Four times a day (QID) | RESPIRATORY_TRACT | Status: DC | PRN
  Filled 2018-03-13: qty 6

## 2018-03-13 MED ORDER — ALPRAZOLAM 0.5 MG PO TABS
0.5000 mg | ORAL_TABLET | Freq: Two times a day (BID) | ORAL | Status: DC
  Administered 2018-03-13 – 2018-03-16 (×7): 0.5 mg via ORAL
  Filled 2018-03-13 (×7): qty 1

## 2018-03-13 MED ORDER — HYDRALAZINE HCL 25 MG PO TABS
25.0000 mg | ORAL_TABLET | Freq: Two times a day (BID) | ORAL | Status: DC
  Administered 2018-03-13 – 2018-03-16 (×7): 25 mg via ORAL
  Filled 2018-03-13 (×7): qty 1

## 2018-03-13 MED ORDER — ADULT MULTIVITAMIN W/MINERALS CH
1.0000 | ORAL_TABLET | Freq: Every day | ORAL | Status: DC
  Administered 2018-03-14 – 2018-03-16 (×3): 1 via ORAL
  Filled 2018-03-13 (×3): qty 1

## 2018-03-13 MED ORDER — METHADONE HCL 10 MG PO TABS
20.0000 mg | ORAL_TABLET | Freq: Four times a day (QID) | ORAL | Status: DC
  Administered 2018-03-13 – 2018-03-16 (×14): 20 mg via ORAL
  Filled 2018-03-13: qty 4
  Filled 2018-03-13 (×4): qty 2
  Filled 2018-03-13: qty 4
  Filled 2018-03-13 (×2): qty 2
  Filled 2018-03-13: qty 4
  Filled 2018-03-13: qty 2
  Filled 2018-03-13: qty 4
  Filled 2018-03-13: qty 2
  Filled 2018-03-13: qty 4
  Filled 2018-03-13: qty 2

## 2018-03-13 MED ORDER — IPRATROPIUM-ALBUTEROL 0.5-2.5 (3) MG/3ML IN SOLN
3.0000 mL | RESPIRATORY_TRACT | Status: DC
  Administered 2018-03-13 – 2018-03-14 (×11): 3 mL via RESPIRATORY_TRACT
  Filled 2018-03-13 (×9): qty 3

## 2018-03-13 MED ORDER — INSULIN ASPART 100 UNIT/ML ~~LOC~~ SOLN
6.0000 [IU] | Freq: Once | SUBCUTANEOUS | Status: AC
  Administered 2018-03-13: 6 [IU] via SUBCUTANEOUS
  Filled 2018-03-13: qty 1

## 2018-03-13 NOTE — Progress Notes (Signed)
Transported pt to ICU 10 on Bipap without incident. Pt remains on Bipap and is tol well.

## 2018-03-13 NOTE — Progress Notes (Signed)
Dr Jefferson Fuel notified that lab unable to draw blood. Difficult stick. Orders for PICC line placement.
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
## 2018-03-13 NOTE — Evaluation (Addendum)
Physical Therapy Evaluation Patient Details Name: [name] MRN: [id number] DOB: [birth year] Today's Date: 03/13/2018   History of Present Illness  63 y.o male with multiple past medical history mostly pertinent for COPD, diabetes mellitus, hepatitis C, HTN, compression fx of T8, depression, anxiety, and peritonitis s/p emergent laparoscopy with placement of loop ileostomy presented to the ED on 03/12/2018 with possible seizure like activity. Found to be in acute respiratory distress requiring BiPAP support. Initial labs revealed elevated troponin of 0.04 likely demand ischemia.    Clinical Impression  Prior to mobility, verbal consent from Dr. Jerelyn Charles to work with pt despite orthopedic consult. Pt A&Ox4 at start of session, complaining of moderate thoracic pain but agreeable. Pt reported being independent prior to admission, left a rehab facility about 3wks ago. Lives with elderly mother who has an aide come 3x a week. Pt stated intermittent use of SPC for safety, drives, pays bills, etc.   Pt on room air ([place] off nose) nursing aware. The patient was restless during session but easily redirected and educated on activity pacing. Mobilized to EOB with verbal cues for log rolling technique with good follow through to minimize back pain, supervision. Sit <> stand with CGA and fair initial standing balance. Able to ambulate ~82ft without AD, CGA. Pt exhibited narrow base of support, reached for support with some unsteadiness noted and lateral trunk sway, no LOB. Pt complained of some fatigue and "whooziness" at end of mobility, spO2 89%, able to improve with seated rest break >90%. Overall the patient demonstrated limitations (see "PT Problem List") that impede the patients ability to perform functional activities, safety, and mobility and would benefit from further skilled PT to address these. Recommendation is STR due to decreased caregiver support, deconditioning status, and mobility, pending pt's  progress.    Follow Up Recommendations SNF    Equipment Recommendations       Recommendations for Other Services       Precautions / Restrictions Precautions Precautions: Fall Precaution Comments: ileostomy  Restrictions Weight Bearing Restrictions: No      Mobility  Bed Mobility Overal bed mobility: Needs Assistance Bed Mobility: Rolling;Sidelying to Sit Rolling: Supervision Sidelying to sit: Supervision       General bed mobility comments: Instructed in log rolling technique to minimize back pain with good carry over.  Transfers Overall transfer level: Needs assistance Equipment used: None Transfers: Sit to/from Stand Sit to Stand: Min guard            Ambulation/Gait   Gait Distance (Feet): 60 Feet Assistive device: None Gait Pattern/deviations: Shuffle;Narrow base of support Gait velocity: decreased   General Gait Details: Pt with narrow base of support, reaches for support, shuffling step, lateral trunk sway noted, no significant LOB noted.  Stairs            Wheelchair Mobility    Modified Rankin (Stroke Patients Only)       Balance Overall balance assessment: Needs assistance   Sitting balance-Leahy Scale: Good       Standing balance-Leahy Scale: Fair                               Pertinent Vitals/Pain Pain Assessment: Faces Faces Pain Scale: Hurts little more Pain Location: thoracic area Pain Descriptors / Indicators: Discomfort;Grimacing;Guarding;Moaning Pain Intervention(s): Limited activity within patient's tolerance;Monitored during session;Repositioned    Home Living Family/patient expects to be discharged to:: Private residence Living Arrangements: Parent Available Help at  Discharge: Family;Other (Comment)(Elderly mother, unable to provide physical assist) Type of Home: House Home Access: Stairs to enter Entrance Stairs-Rails: Right;Left;Can reach both Entrance Stairs-Number of Steps: 3 Home Layout:  One level Home Equipment: Cane - single point;Walker - standard;Walker - 4 wheels      Prior Function Level of Independence: Independent         Comments: Pt reported having SPC that he will use intermittently for community ambulation. Caregiver comes 3x a week to assist with mother.     Hand Dominance        Extremity/Trunk Assessment   Upper Extremity Assessment Upper Extremity Assessment: Overall WFL for tasks assessed    Lower Extremity Assessment Lower Extremity Assessment: Generalized weakness    Cervical / Trunk Assessment Cervical / Trunk Assessment: Normal  Communication   Communication: No difficulties  Cognition Arousal/Alertness: Awake/alert Behavior During Therapy: WFL for tasks assessed/performed;Restless Overall Cognitive Status: Within Functional Limits for tasks assessed                                        General Comments      Exercises     Assessment/Plan    PT Assessment Patient needs continued PT services  PT Problem List Decreased strength;Pain;Cardiopulmonary status limiting activity;Decreased range of motion;Decreased activity tolerance;Decreased knowledge of use of DME;Decreased balance;Decreased safety awareness;Decreased mobility;Decreased knowledge of precautions       PT Treatment Interventions DME instruction;Therapeutic exercise;Gait training;Balance training;Stair training;Neuromuscular re-education;Functional mobility training;Therapeutic activities;Patient/family education    PT Goals (Current goals can be found in the Care Plan section)  Acute Rehab PT Goals Patient Stated Goal: Pt wants to get his strength back PT Goal Formulation: With patient Time For Goal Achievement: 03/27/18 Potential to Achieve Goals: Good    Frequency Min 2X/week   Barriers to discharge Decreased caregiver support      Co-evaluation               AM-PAC PT "6 Clicks" Mobility  Outcome Measure Help needed turning  from your back to your side while in a flat bed without using bedrails?: None Help needed moving from lying on your back to sitting on the side of a flat bed without using bedrails?: None Help needed moving to and from a bed to a chair (including a wheelchair)?: A Little Help needed standing up from a chair using your arms (e.g., wheelchair or bedside chair)?: None Help needed to walk in hospital room?: A Little Help needed climbing 3-5 steps with a railing? : A Little 6 Click Score: 21    End of Session Equipment Utilized During Treatment: Gait belt Activity Tolerance: Patient tolerated treatment well Patient left: with chair alarm set;in chair;with call bell/phone within reach;Other (comment)(respiratory therapist at bedside) Nurse Communication: Mobility status PT Visit Diagnosis: Unsteadiness on feet (R26.81);Difficulty in walking, not elsewhere classified (R26.2);Muscle weakness (generalized) (M62.81)    Time: 1523(multivisit session)-1553 PT Time Calculation (min) (ACUTE ONLY): 30 min   Charges:   PT Evaluation $PT Eval Moderate Complexity: 1 Mod PT Treatments $Therapeutic Activity: 8-22 mins        Lieutenant Diego PT, DPT 4:31 PM,03/13/18 [phone]
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
## 2018-03-13 NOTE — ED Notes (Signed)
Pts O2 trending downwards into the 80s with 3L of O2. MD made aware.

## 2018-03-13 NOTE — Progress Notes (Addendum)
Initial Nutrition Assessment  DOCUMENTATION CODES:   Severe malnutrition in context of chronic illness  INTERVENTION:   Ensure Enlive po TID, each supplement provides 350 kcal and 20 grams of protein  MVI daily  Vitamin C 270m po BID  Pt likely at refeed risk; recommend monitor K, Mg and P labs daily.   NUTRITION DIAGNOSIS:   Severe Malnutrition related to chronic illness(COPD, DM, cirrhosis, hep C) as evidenced by moderate to severe fat depletions, moderate to severe muscle depletions  GOAL:   Patient will meet greater than or equal to 90% of their needs  MONITOR:   PO intake, Supplement acceptance, Labs, Weight trends, Skin, I & O's  REASON FOR ASSESSMENT:   Consult Assessment of nutrition requirement/status  ASSESSMENT:   63y.o. male with multiple past medical history mostly pertinent for COPD, cirrhosis, diabetes mellitus, hepatitis C, HTN, depression, anxiety, T8 compression fracture, peritonitis s/p emergent laparoscopy with placement of loop ileostomy 9/6 presenting to the ED on 03/12/2018 with possible seizure like activity and CAP   Met with pt in room today. Pt reports good appetite and oral intake pta and today. Pt ate 100% of his breakfast this morning. Pt reports he loves Ensure and would like to have these while in hospital. Per chart, pt appears to have lost 25lbs(16%) over the past 8 months; this is significant. 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 Will add vitamin C supplementation and MVI. Pt likely at refeed risk; recommend monitor K, Mg and P labs daily. Of note, pt is edentulous but declines a soft diet.   Medications reviewed and include: azithromycin, lovenox, lasix, insulin, methadone, aldactone, ceftriaxone    Labs reviewed: K 4.0 wnl, creat 1.30(H) Wbc- 14.4(H) cbgs- 158, 354 x 24 hrs  NUTRITION - FOCUSED PHYSICAL EXAM:    Most Recent Value  Orbital Region  Moderate depletion  Upper Arm Region  Severe depletion  Thoracic  and Lumbar Region  Severe depletion  Buccal Region  Moderate depletion  Temple Region  Moderate depletion  Clavicle Bone Region  Moderate depletion  Clavicle and Acromion Bone Region  Moderate depletion  Scapular Bone Region  Moderate depletion  Dorsal Hand  Severe depletion  Patellar Region  Severe depletion  Anterior Thigh Region  Severe depletion  Posterior Calf Region  Severe depletion  Edema (RD Assessment)  None  Hair  Reviewed  Eyes  Reviewed  Mouth  Reviewed  Skin  Reviewed [[email or web address]]  Nails  Reviewed     Diet Order:   Diet Order            Diet 2 gram sodium Room service appropriate? Yes; Fluid consistency: Thin  Diet effective now             EDUCATION NEEDS:   Education needs have been addressed  Skin:  Skin Assessment: Reviewed RN Assessment(ecchymosis )  Last BM:  1/3- type 6  Height:   Ht Readings from Last 1 Encounters:  03/13/18 '5\' 10"'  (1.778 m)    Weight:   Wt Readings from Last 1 Encounters:  03/13/18 58.9 kg    Ideal Body Weight:  75.4 kg  BMI:  Body mass index is 18.63 kg/m.  Estimated Nutritional Needs:   Kcal:  1800-2100kcal/day   Protein:  88-100g/day   Fluid:  1.7L/day   CKoleen DistanceMS, RD, LDN Pager #- [phone]#- [phone] Hours Pager: [phone]
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
## 2018-03-13 NOTE — Progress Notes (Signed)
Per Dr Jefferson Fuel give additional 6 units of Insulin. BG 448.

## 2018-03-13 NOTE — Progress Notes (Signed)
Family Meeting Note  Advance Directive:yes  Today a meeting took place with the Patient.  Patient is able to participate   The following clinical team members were present during this meeting:MD  The following were discussed:Patient's diagnosis:cap,cirrhosis , Patient's progosis: Unable to determine and Goals for treatment: Full Code  Additional follow-up to be provided: prn  Time spent during discussion:20 minutes  Gorden Harms, MD

## 2018-03-13 NOTE — Care Management Note (Signed)
Case Management Note  Patient Details  Name: [name] MRN: [id number] Date of Birth: 1955-08-02  Subjective/Objective:     Patient admitted to the hospital for community acquired pneumonia.   Patient was seen at [facility] in early September and found to have peritonitis requiring surgery and loop ileostomy placement.  Patient reports he may be able to have the ileostomy reversed he is following up later this month at [facility].  After surgery at [facility] patient was sent to [facility] in [place] for rehab.  Patient stayed in rehab for 30 days and was able to go home.  Patient lives with his mother and reports that he is independent and still drives, he has a cane if needed for balance.  Patient's mother is 95 years old and not in the best health, he reports they help each other.  PCP verified as [place].  Patient reports that he quit smoking 2 years ago and quit cocaine at about the same time.  Patient does have COPD and uses a nebulizer at home and has inhalers but does not need oxygen.  Patient is currently having a PICC line placed at the bedside for poor IV access.  RNCM will cont to follow and assess for discharge needs. Doran Clay RN BSN [phone] Action/Plan:   Expected Discharge Date:                  Expected Discharge Plan:  [place]  In-House Referral:     Discharge planning Services  CM Consult  Post Acute Care Choice:    Choice offered to:     DME Arranged:    DME Agency:     HH Arranged:    [place] Agency:     Status of Service:  In process, will continue to follow  If discussed at Long Length of Stay Meetings, dates discussed:    Additional Comments:  Tommy Hutching, RN 03/13/2018, 2:07 PM
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
## 2018-03-13 NOTE — Progress Notes (Signed)
[place] [facility] [place] NAME: [name]    MR#:  [id number]  DATE OF BIRTH:  1955-05-03  SUBJECTIVE:  Patient without complaint, case discussed with intensivist-patient was successfully weaned off BiPAP, possible transfer to the floor later today, neurology input appreciated, orthopedic surgery to see  REVIEW OF SYSTEMS:  CONSTITUTIONAL: No fever, fatigue or weakness.  EYES: No blurred or double vision.  EARS, NOSE, AND THROAT: No tinnitus or ear pain.  RESPIRATORY: No cough, shortness of breath, wheezing or hemoptysis.  CARDIOVASCULAR: No chest pain, orthopnea, edema.  GASTROINTESTINAL: No nausea, vomiting, diarrhea or abdominal pain.  GENITOURINARY: No dysuria, hematuria.  ENDOCRINE: No polyuria, nocturia,  HEMATOLOGY: No anemia, easy bruising or bleeding SKIN: No rash or lesion. MUSCULOSKELETAL: No joint pain or arthritis.   NEUROLOGIC: No tingling, numbness, weakness.  PSYCHIATRY: No anxiety or depression.   ROS  DRUG ALLERGIES:  No Known Allergies  VITALS:  Blood pressure (!) 156/81, pulse 95, temperature 98.1 F (36.7 C), resp. rate 15, height 5\' 10"  (1.778 m), weight 58.9 kg, SpO2 90 %.  PHYSICAL EXAMINATION:  GENERAL:  63 y.o.-year-old patient lying in the bed with no acute distress.  EYES: Pupils equal, round, reactive to light and accommodation. No scleral icterus. Extraocular muscles intact.  HEENT: Head atraumatic, normocephalic. Oropharynx and nasopharynx clear.  NECK:  Supple, no jugular venous distention. No thyroid enlargement, no tenderness.  LUNGS: Normal breath sounds bilaterally, no wheezing, rales,rhonchi or crepitation. No use of accessory muscles of respiration.  CARDIOVASCULAR: S1, S2 normal. No murmurs, rubs, or gallops.  ABDOMEN: Soft, nontender, nondistended. Bowel sounds present. No organomegaly or mass.  EXTREMITIES: No pedal edema, cyanosis, or clubbing.  NEUROLOGIC: Cranial nerves II through XII are  intact. Muscle strength 5/5 in all extremities. Sensation intact. Gait not checked.  PSYCHIATRIC: The patient is alert and oriented x 3.  SKIN: No obvious rash, lesion, or ulcer.   Physical Exam LABORATORY PANEL:   CBC Recent Labs  Lab 03/12/18 2126  WBC 14.4*  HGB 16.0  HCT 49.3  PLT 118*   ------------------------------------------------------------------------------------------------------------------  Chemistries  Recent Labs  Lab 03/12/18 2126  NA 137  K 4.0  CL 105  CO2 16*  GLUCOSE 154*  BUN 22  CREATININE 1.30*  CALCIUM 9.5   ------------------------------------------------------------------------------------------------------------------  Cardiac Enzymes Recent Labs  Lab 03/12/18 2126  TROPONINI <0.03   ------------------------------------------------------------------------------------------------------------------  RADIOLOGY:  Dg Chest 2 View  Result Date: 03/12/2018 CLINICAL DATA:  63 y/o M; chest pain and possible seizure. Agitation and altered mental status. EXAM: CHEST - 2 VIEW COMPARISON:  08/03/2017 chest radiograph. 07/21/2017 chest CT. FINDINGS: Normal cardiac silhouette. Aortic atherosclerosis with calcification. Emphysema and hyperinflated lungs. Reticular opacities. No focal consolidation. No pleural effusion or pneumothorax. Interval age-indeterminate moderate to severe T8 compression deformity. IMPRESSION: 1. Emphysema. Reticular opacities may represent interstitial edema or atypical pneumonia. 2. Interval age-indeterminate moderate to severe T8 compression deformity. 3. Aortic atherosclerosis. Electronically Signed   By: Kristine Garbe M.D.   On: 03/12/2018 21:46   [place] Ekg Site Rite  Result Date: 03/13/2018 If Site Rite image not attached, placement could not be confirmed due to current cardiac rhythm.   ASSESSMENT AND PLAN:  *Acute CAP Continue pneumonia protocol, empiric Rocephin/azithromycin, follow-up on cultures  *Acute on  chronic hypoxic respiratory failure Most likely secondary to pneumonia and asthma Required BiPAP for a short period of time, transferred to ICU for further care, discussed with intensivist-possible discharge to the floor later today  Note-patient on 2 L via nasal cannula chronically at home  *Chronic asthma exacerbation IV Solu-Medrol with tapering as tolerated, inhaled corticosteroids twice daily, aggressive pulmonary toilet bronchodilator therapy, antibiotics per above   *Acute ?  Seizure Patient not on AEDs, no evidence of tongue biting/fecal or urinary incontinence, patient with retrograde amnesia to event Neurology input appreciated-no intervention/treatment-clinical suspicion for seizure was low Urinalysis unimpressive, follow-up on urine drug screen  *Acute on chronic back pain Most likely secondary to chronic T8 compression fracture, noted moderate to severe compression Orthopedic surgery/Dr. 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 Management plans discussed with the patient, family and they are in agreement.  CODE STATUS: full  TOTAL TIME TAKING CARE OF THIS PATIENT: 35 minutes.     POSSIBLE D/C IN 2-5 DAYS, DEPENDING ON CLINICAL CONDITION.   Avel Peace Jaina Morin M.D on 03/13/2018   Between 7am to 6pm - Pager - [phone]  After 6pm go to [email or web address] - password [email or web address] [facility]  Office  [phone]  CC: Primary care physician; Theotis Burrow, MD  Note: This dictation was prepared with Dragon dictation along with smaller phrase technology. Any transcriptional errors that result from this process are unintentional.

## 2018-03-13 NOTE — Progress Notes (Signed)
Peripherally Inserted Central Catheter/Midline Placement  The IV Nurse has discussed with the patient and/or persons authorized to consent for the patient, the purpose of this procedure and the potential benefits and risks involved with this procedure.  The benefits include less needle sticks, lab draws from the catheter, and the patient may be discharged home with the catheter. Risks include, but not limited to, infection, bleeding, blood clot (thrombus formation), and puncture of an artery; nerve damage and irregular heartbeat and possibility to perform a PICC exchange if needed/ordered by physician.  Alternatives to this procedure were also discussed.  Bard Power PICC patient education guide, fact sheet on infection prevention and patient information card has been provided to patient /or left at bedside.    PICC/Midline Placement Documentation  PICC Double Lumen 03/13/18 PICC Right Brachial 37 cm 1 cm (Active)  Indication for Insertion or Continuance of Line Limited venous access - need for IV therapy >5 days (PICC only) 03/13/2018  3:07 PM  Exposed Catheter (cm) 1 cm 03/13/2018  3:07 PM  Site Assessment Clean;Intact;Dry 03/13/2018  3:07 PM  Lumen #1 Status Flushed;Blood return noted 03/13/2018  3:07 PM  Lumen #2 Status Flushed;Blood return noted 03/13/2018  3:07 PM  Dressing Type Transparent 03/13/2018  3:07 PM  Dressing Status Clean;Dry;Intact;Antimicrobial disc in place 03/13/2018  3:07 PM  Dressing Intervention New dressing 03/13/2018  3:07 PM  Dressing Change Due 03/20/18 03/13/2018  3:07 PM   Telephone consent signed by sister    Synthia Innocent 03/13/2018, 3:08 PM

## 2018-03-14 LAB — GLUCOSE, CAPILLARY
GLUCOSE-CAPILLARY: 264 mg/dL — AB (ref 70–99)
Glucose-Capillary: 269 mg/dL — ABNORMAL HIGH (ref 70–99)
Glucose-Capillary: 400 mg/dL — ABNORMAL HIGH (ref 70–99)
Glucose-Capillary: 389 mg/dL — ABNORMAL HIGH (ref 70–99)
Glucose-Capillary: 245 mg/dL — ABNORMAL HIGH (ref 70–99)

## 2018-03-14 MED ORDER — IPRATROPIUM-ALBUTEROL 0.5-2.5 (3) MG/3ML IN SOLN
3.0000 mL | Freq: Three times a day (TID) | RESPIRATORY_TRACT | Status: DC
  Administered 2018-03-15 – 2018-03-16 (×4): 3 mL via RESPIRATORY_TRACT
  Filled 2018-03-14 (×4): qty 3

## 2018-03-14 MED ORDER — INSULIN ASPART 100 UNIT/ML ~~LOC~~ SOLN: 16.0000 [IU] | Freq: Once | SUBCUTANEOUS | Status: DC

## 2018-03-14 MED ORDER — ORAL CARE MOUTH RINSE
15.0000 mL | Freq: Two times a day (BID) | OROMUCOSAL | Status: DC
  Administered 2018-03-14 – 2018-03-15 (×2): 15 mL via OROMUCOSAL

## 2018-03-14 MED ORDER — PREDNISONE 20 MG PO TABS
40.0000 mg | ORAL_TABLET | Freq: Every day | ORAL | Status: DC
  Administered 2018-03-15 – 2018-03-16 (×2): 40 mg via ORAL
  Filled 2018-03-14 (×2): qty 2

## 2018-03-14 MED ORDER — AZITHROMYCIN 500 MG PO TABS
500.0000 mg | ORAL_TABLET | ORAL | Status: DC
  Administered 2018-03-15: 500 mg via ORAL
  Filled 2018-03-14: qty 1

## 2018-03-14 MED ORDER — SODIUM CHLORIDE 0.9 % IV SOLN
1.0000 g | INTRAVENOUS | Status: DC
  Administered 2018-03-15 – 2018-03-16 (×2): 1 g via INTRAVENOUS
  Filled 2018-03-14 (×2): qty 1

## 2018-03-14 MED ORDER — ENOXAPARIN SODIUM 40 MG/0.4ML ~~LOC~~ SOLN
40.0000 mg | SUBCUTANEOUS | Status: DC
  Administered 2018-03-15: 40 mg via SUBCUTANEOUS
  Filled 2018-03-14: qty 0.4

## 2018-03-14 MED ORDER — INSULIN GLARGINE 100 UNIT/ML ~~LOC~~ SOLN
10.0000 [IU] | Freq: Every day | SUBCUTANEOUS | Status: DC
  Administered 2018-03-14: 10 [IU] via SUBCUTANEOUS
  Filled 2018-03-14 (×2): qty 0.1

## 2018-03-14 MED ORDER — IPRATROPIUM-ALBUTEROL 0.5-2.5 (3) MG/3ML IN SOLN: 3.0000 mL | RESPIRATORY_TRACT | Status: DC | PRN

## 2018-03-14 NOTE — Progress Notes (Addendum)
Patient's BS was 400. Notified MD. New orders to give one time dose of 16 unit.   BS was taken at 1745, BS 389. 16 units of insulin given at 1630. Ate 100% of dinner. Notified MD.

## 2018-03-14 NOTE — Progress Notes (Signed)
Follow up - Critical Care Medicine Note  Patient Details:    [name] is an 63 y.o. male.with a past medical history remarkable for COPD, anxiety/depression, BPH, diabetes, hypertension, hepatitis C chronic, cirrhosis Was brought to the emergency department after possible seizure episode.  Was found by EMS agitated confused with altered mental status.  Upon arrival to the emergency department his mentation had improved.  Patient relates that he has had difficulty with breathing for the past month.  He states he is had the flu.  He also has a compression fracture/T8 and uses chronic pain medication.  He was moved to the intensive care unit secondary to worsening shortness of breath requiring BiPAP   Lines, Airways, Drains: PICC Double Lumen 03/13/18 PICC Right Brachial 37 cm 1 cm (Active)  Indication for Insertion or Continuance of Line Limited venous access - need for IV therapy >5 days (PICC only);Poor Vasculature-patient has had multiple peripheral attempts or PIVs lasting less than 24 hours 03/14/2018  7:21 AM  Exposed Catheter (cm) 1 cm 03/13/2018  3:07 PM  Site Assessment Clean;Dry;Intact 03/13/2018 11:08 PM  Lumen #1 Status Flushed 03/13/2018 11:08 PM  Lumen #2 Status Flushed 03/13/2018 11:08 PM  Dressing Type Transparent 03/13/2018 11:08 PM  Dressing Status Clean;Dry 03/13/2018 11:08 PM  Dressing Intervention New dressing 03/13/2018 11:08 PM  Dressing Change Due 03/20/18 03/13/2018 11:08 PM     Colostomy RLQ (Active)  Ostomy Pouch 2 piece;Intact 03/14/2018  7:40 AM  Stoma Assessment Pink 03/14/2018  7:40 AM    Anti-infectives:  Anti-infectives (From admission, onward)   Start     Dose/Rate Route Frequency Ordered Stop   03/13/18 1000  Sofosbuvir-Velpatasvir 400-100 MG TABS 1 tablet     1 tablet Oral Daily 03/13/18 0134     03/13/18 0145  rifaximin (XIFAXAN) tablet 550 mg     550 mg Oral 2 times daily 03/13/18 0134     03/13/18 0145  cefTRIAXone (ROCEPHIN) 1 g in sodium chloride 0.9 % 100 mL  IVPB     1 g 200 mL/hr over 30 Minutes Intravenous Every 24 hours 03/13/18 0134 03/20/18 0144   03/13/18 0145  azithromycin (ZITHROMAX) tablet 500 mg     500 mg Oral Every 24 hours 03/13/18 0134 03/20/18 0144   03/12/18 2230  cefTRIAXone (ROCEPHIN) 1 g in sodium chloride 0.9 % 100 mL IVPB     1 g 200 mL/hr over 30 Minutes Intravenous  Once 03/12/18 2222 03/13/18 0024   03/12/18 2230  azithromycin (ZITHROMAX) 500 mg in sodium chloride 0.9 % 250 mL IVPB     500 mg 250 mL/hr over 60 Minutes Intravenous  Once 03/12/18 2222 03/13/18 0133      Microbiology: Results for orders placed or performed during the hospital encounter of 03/12/18  MRSA PCR Screening     Status: None   Collection Time: 03/13/18  6:56 AM  Result Value Ref Range Status   MRSA by PCR NEGATIVE NEGATIVE Final    Comment:        The GeneXpert MRSA Assay (FDA approved for NASAL specimens only), is one component of a comprehensive MRSA colonization surveillance program. It is not intended to diagnose MRSA infection nor to guide or monitor treatment for MRSA infections. Performed at [facility], [address] [place], [place] [address]   Blood culture (routine x 2)     Status: None (Preliminary result)   Collection Time: 03/13/18  1:07 PM  Result Value Ref Range Status   Specimen  Description BLOOD FINGER  Final   Special Requests   Final    BOTTLES DRAWN AEROBIC AND ANAEROBIC Blood Culture adequate volume   Culture   Final    NO GROWTH < 24 HOURS Performed at [facility], [address] [place], [place] [address]    Report Status PENDING  Incomplete  Blood culture (routine x 2)     Status: None (Preliminary result)   Collection Time: 03/13/18  1:07 PM  Result Value Ref Range Status   Specimen Description BLOOD FINGER  Final   Special Requests   Final    BOTTLES DRAWN AEROBIC ONLY Blood Culture adequate volume   Culture   Final    NO GROWTH < 24 HOURS Performed at [facility], [address] [place],  [address]    Report Status PENDING  Incomplete  Studies: Dg Chest 2 View  Result Date: 03/12/2018 CLINICAL DATA:  63 y/o M; chest pain and possible seizure. Agitation and altered mental status. EXAM: CHEST - 2 VIEW COMPARISON:  08/03/2017 chest radiograph. 07/21/2017 chest CT. FINDINGS: Normal cardiac silhouette. Aortic atherosclerosis with calcification. Emphysema and hyperinflated lungs. Reticular opacities. No focal consolidation. No pleural effusion or pneumothorax. Interval age-indeterminate moderate to severe T8 compression deformity. IMPRESSION: 1. Emphysema. Reticular opacities may represent interstitial edema or atypical pneumonia. 2. Interval age-indeterminate moderate to severe T8 compression deformity. 3. Aortic atherosclerosis. Electronically Signed   By: Kristine Garbe M.D.   On: 03/12/2018 21:46   [place] Ekg Site Rite  Result Date: 03/13/2018 If Site Rite image not attached, placement could not be confirmed due to current cardiac rhythm.   Consults: Treatment Team:  Leotis Pain, MD Poggi, Marshall Cork, MD   Subjective:    Overnight Issues: Patient feels great today, shortness of breath resolved, weaned off of BiPAP  Objective:  Vital signs for last 24 hours: Temp:  [98 F (36.7 C)-98.4 F (36.9 C)] 98 F (36.7 C) (01/04 0742) Pulse Rate:  [86-99] 93 (01/04 0742) Resp:  [11-36] 18 (01/04 0800) BP: (111-156)/(55-86) 145/65 (01/04 0800) SpO2:  [88 %-98 %] 92 % (01/04 0742) FiO2 (%):  [32 %] 32 % (01/03 2337)  Hemodynamic parameters for last 24 hours:    Intake/Output from previous day: 01/03 0701 - 01/04 0700 In: 480 [P.O.:480] Out: 1820 [Urine:1820]  Intake/Output this shift: No intake/output data recorded.  Vent settings for last 24 hours: FiO2 (%):  [32 %] 32 %  Physical Exam:   Vital signs:       Please see the above listed vital signs HEENT:           Trachea is midline, no thyromegaly appreciated, no jugular  venous distention, wearing BiPAP.  Mild labored breathing Cardiovascular:           Distant heart sounds, regular rate and rhythm Pulmonary:      Prolonged expiratory phase, some wheezing noted with diminished breath sounds Abdominal:      Positive bowel sounds, soft exam, colostomy in place Extremities:     No clubbing, cyanosis or edema noted Neurologic:      No focal deficits appreciated, moves all extremities Cutaneous:      No rashes or lesions noted  Assessment/Plan:  Respiratory distress.  Patient with a known history of COPD, patient weaned off of BiPAP, on nasal cannula doing well, will continue.  Albuterol, Atrovent, budesonide, Solu-Medrol,   azithromycin and Rocephin.    Patient is stable for floor transfer   Hermelinda Dellen, DO  Naftula Donahue 03/14/2018  *Care during the described time interval was provided by me and/or other providers on the critical care team.  I have reviewed this patient's available data, including medical history, events of note, physical examination and test results as part of my evaluation.

## 2018-03-14 NOTE — Progress Notes (Signed)
Patient ID: [name], male   DOB: Apr 04, 1955, 63 y.o.   MRN: [id number]  [employer] PROGRESS NOTE  [name] YTK:[id number] DOB: December 11, 1955 DOA: 03/12/2018 PCP: Theotis Burrow, MD  HPI/Subjective: Patient feeling a little bit better today.  Off the BiPAP and transferred out of the ICU.  Still has some dry cough and some upper airway wheeze.  Objective: Vitals:   03/14/18 1048 03/14/18 1121  BP: 130/76   Pulse: 84   Resp: 20   Temp: 98.4 F (36.9 C)   SpO2: 96% 93%    Filed Weights   03/12/18 2120 03/13/18 0653  Weight: 61.2 kg 58.9 kg    ROS: Review of Systems  Constitutional: Negative for chills and fever.  Eyes: Negative for blurred vision.  Respiratory: Positive for shortness of breath and wheezing. Negative for cough.   Cardiovascular: Negative for chest pain.  Gastrointestinal: Negative for abdominal pain, constipation, diarrhea, nausea and vomiting.  Genitourinary: Negative for dysuria.  Musculoskeletal: Positive for joint pain.  Neurological: Negative for dizziness and headaches.   Exam: Physical Exam  Constitutional: He is oriented to person, place, and time.  HENT:  Nose: No mucosal edema.  Mouth/Throat: No oropharyngeal exudate or posterior oropharyngeal edema.  Eyes: Pupils are equal, round, and reactive to light. Conjunctivae, EOM and lids are normal.  Neck: No JVD present. Carotid bruit is not present. No edema present. No thyroid mass and no thyromegaly present.  Cardiovascular: S1 normal and S2 normal. Exam reveals no gallop.  No murmur heard. Pulses:      Dorsalis pedis pulses are 2+ on the right side and 2+ on the left side.  Respiratory: No respiratory distress. He has decreased breath sounds in the right lower field and the left lower field. He has no wheezes. He has no rhonchi. He has no rales.  Upper airway transmitted wheeze  GI: Soft. Bowel sounds are normal. There is no abdominal tenderness.  Musculoskeletal:   Right ankle: He exhibits no swelling.     Left ankle: He exhibits no swelling.  Lymphadenopathy:    He has no cervical adenopathy.  Neurological: He is alert and oriented to person, place, and time. No cranial nerve deficit.  Skin: Skin is warm. No rash noted. Nails show no clubbing.  Psychiatric: He has a normal mood and affect.      Data Reviewed: Basic Metabolic Panel: Recent Labs  Lab 03/12/18 2126 03/13/18 1458  NA 137  --   K 4.0  --   CL 105  --   CO2 16*  --   GLUCOSE 154*  --   BUN 22  --   CREATININE 1.30* 0.97  CALCIUM 9.5  --    CBC: Recent Labs  Lab 03/12/18 2126 03/13/18 1458  WBC 14.4* 7.1  HGB 16.0 13.3  HCT 49.3 39.6  MCV 99.4 95.7  PLT 118* 87*   Cardiac Enzymes: Recent Labs  Lab 03/12/18 2126  TROPONINI <0.03   BNP (last 3 results) Recent Labs    07/11/17 2016 07/21/17 1332 08/06/17 0402  BNP 179.0* 40.0 46.0     CBG: Recent Labs  Lab 03/13/18 1220 03/13/18 1716 03/13/18 2224 03/14/18 0755 03/14/18 1159  GLUCAP 354* 448* 228* 264* 269*    Recent Results (from the past 240 hour(s))  MRSA PCR Screening     Status: None   Collection Time: 03/13/18  6:56 AM  Result Value Ref Range Status   MRSA by PCR NEGATIVE NEGATIVE Final  Comment:        The GeneXpert MRSA Assay (FDA approved for NASAL specimens only), is one component of a comprehensive MRSA colonization surveillance program. It is not intended to diagnose MRSA infection nor to guide or monitor treatment for MRSA infections. Performed at [facility], [address] [place], [place] [address]   Blood culture (routine x 2)     Status: None (Preliminary result)   Collection Time: 03/13/18  1:07 PM  Result Value Ref Range Status   Specimen Description BLOOD FINGER  Final   Special Requests   Final    BOTTLES DRAWN AEROBIC AND ANAEROBIC Blood Culture adequate volume   Culture   Final    NO GROWTH < 24 HOURS Performed at [facility], [address] [place], [place] [address]    Report Status PENDING  Incomplete  Blood culture (routine x 2)     Status: None (Preliminary result)   Collection Time: 03/13/18  1:07 PM  Result Value Ref Range Status   Specimen Description BLOOD FINGER  Final   Special Requests   Final    BOTTLES DRAWN AEROBIC ONLY Blood Culture adequate volume   Culture   Final    NO GROWTH < 24 HOURS Performed at [facility], [address], [place], [place] [address]    Report Status PENDING  Incomplete     Studies: Dg Chest 2 View  Result Date: 03/12/2018 CLINICAL DATA:  63 y/o M; chest pain and possible seizure. Agitation and altered mental status. EXAM: CHEST - 2 VIEW COMPARISON:  08/03/2017 chest radiograph. 07/21/2017 chest CT. FINDINGS: Normal cardiac silhouette. Aortic atherosclerosis with calcification. Emphysema and hyperinflated lungs. Reticular opacities. No focal consolidation. No pleural effusion or pneumothorax. Interval age-indeterminate moderate to severe T8 compression deformity. IMPRESSION: 1. Emphysema. Reticular opacities may represent interstitial edema or atypical pneumonia. 2. Interval age-indeterminate moderate to severe T8 compression deformity. 3. Aortic atherosclerosis. Electronically Signed   By: Kristine Garbe M.D.   On: 03/12/2018 21:46   [place] Ekg Site Rite  Result Date: 03/13/2018 If Site Rite image not attached, placement could not be confirmed due to current cardiac rhythm.   Scheduled Meds: . alfuzosin  10 mg Oral BID  . ALPRAZolam  0.5 mg Oral BID  . [START ON 03/15/2018] azithromycin  500 mg Oral Q24H  . carvedilol  6.25 mg Oral BID WC  . enoxaparin (LOVENOX) injection  40 mg Subcutaneous Q24H  . feeding supplement (ENSURE ENLIVE)  237 mL Oral TID BM  . fluticasone  2 spray Each Nare Daily  . furosemide  20 mg Oral Daily  . guaiFENesin  600 mg Oral BID  . hydrALAZINE  25 mg Oral BID  . insulin aspart  0-5 Units Subcutaneous QHS  . insulin aspart   0-9 Units Subcutaneous TID WC  . ipratropium-albuterol  3 mL Nebulization Q4H  . mouth rinse  15 mL Mouth Rinse BID  . methadone  20 mg Oral QID  . methylPREDNISolone (SOLU-MEDROL) injection  60 mg Intravenous Q12H   Followed by  . [START ON 03/15/2018] predniSONE  40 mg Oral Q breakfast  . mometasone-formoterol  2 puff Inhalation BID  . multivitamin with minerals  1 tablet Oral Daily  . rifaximin  550 mg Oral BID  . sodium chloride flush  10-40 mL Intracatheter Q12H  . Sofosbuvir-Velpatasvir  1 tablet Oral Daily  . spironolactone  50 mg Oral Daily  . vitamin C  250 mg Oral BID  Continuous Infusions: . [START ON 03/15/2018] cefTRIAXone (ROCEPHIN)  IV      Assessment/Plan:  1. Acute hypoxic respiratory failure.  Patient weaned off BiPAP and placed on nasal cannula.  When I saw him he had his nasal cannula off.  Check pulse ox on room air. 2. Pneumonia.  Patient placed on Rocephin and Zithromax 3. COPD exacerbation.  Placed on Solu-Medrol.  I will taper down to daily dosing.  Continue nebulizer treatments. 4. Questionable seizure event.  Seen by neurology and they did not recommend any further evaluation. 5. Chronic back pain on methadone 6. Chronic T8 compression fracture.  Patient did not want any further intervention 7. Type 2 diabetes mellitus.  Sugars will be high while on steroids. 8. Chronic pain on methadone 9. Patient with recent surgery for bowel perforation and has a colostomy 10. Hepatitis C with cirrhosis of the liver  Code Status:     Code Status Orders  (From admission, onward)         Start     Ordered   03/13/18 0134  Full code  Continuous     03/13/18 0134        Code Status History    Date Active Date Inactive Code Status Order ID Comments User Context   08/04/2017 0055 08/09/2017 1712 Full Code [id number]  Amelia Jo, MD Inpatient   07/21/2017 2303 07/26/2017 1802 Full Code [id number]  Bettey Costa, MD Inpatient   07/12/2017 0139 07/15/2017 1916 Full Code  [id number]  Lance Coon, MD Inpatient   12/21/2016 2159 12/23/2016 1715 Full Code [id number]  Idelle Crouch, MD Inpatient   09/09/2016 2250 09/16/2016 1727 Full Code [id number]  Gladstone Lighter, MD Inpatient   09/09/2016 1934 09/09/2016 2250 Full Code [id number]  Nena Polio, MD ED   09/03/2016 2152 09/05/2016 1844 Full Code [id number]  Henreitta Leber, MD Inpatient   11/06/2014 0551 11/07/2014 1818 Full Code [id number]  Juluis Mire, MD Inpatient   08/10/2014 2023 08/12/2014 1623 Full Code [id number]  Theodoro Grist, MD Inpatient     Disposition Plan: Evaluate daily on when to go home  Antibiotics:  Rocephin  Zithromax  Time spent: 28 minutes  [place]

## 2018-03-15 LAB — GLUCOSE, CAPILLARY
GLUCOSE-CAPILLARY: 169 mg/dL — AB (ref 70–99)
Glucose-Capillary: 221 mg/dL — ABNORMAL HIGH (ref 70–99)
Glucose-Capillary: 155 mg/dL — ABNORMAL HIGH (ref 70–99)
Glucose-Capillary: 249 mg/dL — ABNORMAL HIGH (ref 70–99)
Glucose-Capillary: 230 mg/dL — ABNORMAL HIGH (ref 70–99)

## 2018-03-15 MED ORDER — INSULIN GLARGINE 100 UNIT/ML ~~LOC~~ SOLN
8.0000 [IU] | Freq: Every day | SUBCUTANEOUS | Status: DC
  Administered 2018-03-15: 8 [IU] via SUBCUTANEOUS
  Filled 2018-03-15 (×2): qty 0.08

## 2018-03-15 NOTE — Progress Notes (Signed)
Patient ID: [name], male   DOB: [birth year], 63 y.o.   MRN: [id number]  [facility] PROGRESS NOTE  Orville Mena CHY:[id number] DOB: 1955-11-21 DOA: 03/12/2018 PCP: Theotis Burrow, MD  HPI/Subjective: Patient still very congested.  He is happy that he is coughing up some phlegm at this point.  Phlegm is dark gray.  Still some audible wheeze and shortness of breath.  Patient is hoping to go home.  Objective: Vitals:   03/15/18 0755 03/15/18 0802  BP:  (!) 144/68  Pulse:  87  Resp:  20  Temp:  97.7 F (36.5 C)  SpO2: 91% 97%    Filed Weights   03/12/18 2120 03/13/18 0653  Weight: 61.2 kg 58.9 kg    ROS: Review of Systems  Constitutional: Negative for chills and fever.  Eyes: Negative for blurred vision.  Respiratory: Positive for cough, shortness of breath and wheezing.   Cardiovascular: Negative for chest pain.  Gastrointestinal: Negative for abdominal pain, constipation, diarrhea, nausea and vomiting.  Genitourinary: Negative for dysuria.  Musculoskeletal: Positive for joint pain.  Neurological: Negative for dizziness and headaches.   Exam: Physical Exam  Constitutional: He is oriented to person, place, and time.  HENT:  Nose: No mucosal edema.  Mouth/Throat: No oropharyngeal exudate or posterior oropharyngeal edema.  Eyes: Pupils are equal, round, and reactive to light. Conjunctivae, EOM and lids are normal.  Neck: No JVD present. Carotid bruit is not present. No edema present. No thyroid mass and no thyromegaly present.  Cardiovascular: S1 normal and S2 normal. Exam reveals no gallop.  No murmur heard. Pulses:      Dorsalis pedis pulses are 2+ on the right side and 2+ on the left side.  Respiratory: No respiratory distress. He has decreased breath sounds in the right lower field and the left lower field. He has wheezes in the right lower field and the left lower field. He has no rhonchi. He has no rales.  GI: Soft. Bowel sounds are normal. There  is no abdominal tenderness.  Musculoskeletal:     Right ankle: He exhibits no swelling.     Left ankle: He exhibits no swelling.  Lymphadenopathy:    He has no cervical adenopathy.  Neurological: He is alert and oriented to person, place, and time. No cranial nerve deficit.  Skin: Skin is warm. No rash noted. Nails show no clubbing.  Psychiatric: He has a normal mood and affect.      Data Reviewed: Basic Metabolic Panel: Recent Labs  Lab 03/12/18 2126 03/13/18 1458  NA 137  --   K 4.0  --   CL 105  --   CO2 16*  --   GLUCOSE 154*  --   BUN 22  --   CREATININE 1.30* 0.97  CALCIUM 9.5  --    CBC: Recent Labs  Lab 03/12/18 2126 03/13/18 1458  WBC 14.4* 7.1  HGB 16.0 13.3  HCT 49.3 39.6  MCV 99.4 95.7  PLT 118* 87*   Cardiac Enzymes: Recent Labs  Lab 03/12/18 2126  TROPONINI <0.03   BNP (last 3 results) Recent Labs    07/11/17 2016 07/21/17 1332 08/06/17 0402  BNP 179.0* 40.0 46.0     CBG: Recent Labs  Lab 03/14/18 1742 03/14/18 2102 03/15/18 0453 03/15/18 0803 03/15/18 1153  GLUCAP 389* 245* 221* 155* 169*    Recent Results (from the past 240 hour(s))  MRSA PCR Screening     Status: None   Collection Time: 03/13/18  6:56 AM  Result Value Ref Range Status   MRSA by PCR NEGATIVE NEGATIVE Final    Comment:        The GeneXpert MRSA Assay (FDA approved for NASAL specimens only), is one component of a comprehensive MRSA colonization surveillance program. It is not intended to diagnose MRSA infection nor to guide or monitor treatment for MRSA infections. Performed at [facility], [address] [place], [place] [address]   Blood culture (routine x 2)     Status: None (Preliminary result)   Collection Time: 03/13/18  1:07 PM  Result Value Ref Range Status   Specimen Description BLOOD FINGER  Final   Special Requests   Final    BOTTLES DRAWN AEROBIC AND ANAEROBIC Blood Culture adequate volume   Culture   Final    NO GROWTH 2  DAYS Performed at [facility], [address] [place], [place] [address]    Report Status PENDING  Incomplete  Blood culture (routine x 2)     Status: None (Preliminary result)   Collection Time: 03/13/18  1:07 PM  Result Value Ref Range Status   Specimen Description BLOOD FINGER  Final   Special Requests   Final    BOTTLES DRAWN AEROBIC ONLY Blood Culture adequate volume   Culture   Final    NO GROWTH 2 DAYS Performed at [facility], [address] [place],  [address]    Report Status PENDING  Incomplete     Studies: No results found.  Scheduled Meds: . alfuzosin  10 mg Oral BID  . ALPRAZolam  0.5 mg Oral BID  . azithromycin  500 mg Oral Q24H  . carvedilol  6.25 mg Oral BID WC  . enoxaparin (LOVENOX) injection  40 mg Subcutaneous Q24H  . feeding supplement (ENSURE ENLIVE)  237 mL Oral TID BM  . fluticasone  2 spray Each Nare Daily  . furosemide  20 mg Oral Daily  . guaiFENesin  600 mg Oral BID  . hydrALAZINE  25 mg Oral BID  . insulin aspart  0-5 Units Subcutaneous QHS  . insulin aspart  0-9 Units Subcutaneous TID WC  . insulin aspart  16 Units Subcutaneous Once  . insulin glargine  10 Units Subcutaneous QHS  . ipratropium-albuterol  3 mL Nebulization TID  . mouth rinse  15 mL Mouth Rinse BID  . methadone  20 mg Oral QID  . mometasone-formoterol  2 puff Inhalation BID  . multivitamin with minerals  1 tablet Oral Daily  . predniSONE  40 mg Oral Q breakfast  . rifaximin  550 mg Oral BID  . sodium chloride flush  10-40 mL Intracatheter Q12H  . Sofosbuvir-Velpatasvir  1 tablet Oral Daily  . spironolactone  50 mg Oral Daily  . vitamin C  250 mg Oral BID   Continuous Infusions: . cefTRIAXone (ROCEPHIN)  IV 1 g (03/15/18 0957)    Assessment/Plan:  1. Acute hypoxic respiratory failure.  Patient weaned off BiPAP and now breathing on room air. 2. Pneumonia.  Patient placed on Rocephin and Zithromax 3. COPD exacerbation.  Switched over to  oral prednisone secondary to high sugars. 4. Questionable seizure event.  Seen by neurology and they did not recommend any further evaluation. 5. T8 compression fracture.  Patient did not want any further intervention 6. Type 2 diabetes mellitus.  Sugars will be high while on steroids.  Started low-dose Lantus at night while on steroids. 7. Chronic pain on methadone 8. 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 Hepatitis C with cirrhosis of the liver 10. Weakness.  Physical therapy recommended rehab.  Patient interested in going home.  Physical therapy reevaluation.  Code Status:     Code Status Orders  (From admission, onward)         Start     Ordered   03/13/18 0134  Full code  Continuous     03/13/18 0134        Code Status History    Date Active Date Inactive Code Status Order ID Comments User Context   08/04/2017 0055 08/09/2017 1712 Full Code [id number]  Amelia Jo, MD Inpatient   07/21/2017 2303 07/26/2017 1802 Full Code [id number]  Bettey Costa, MD Inpatient   07/12/2017 0139 07/15/2017 1916 Full Code [id number]  Lance Coon, MD Inpatient   12/21/2016 2159 12/23/2016 1715 Full Code [id number]  Idelle Crouch, MD Inpatient   09/09/2016 2250 09/16/2016 1727 Full Code [id number]  Gladstone Lighter, MD Inpatient   09/09/2016 1934 09/09/2016 2250 Full Code [id number]  Nena Polio, MD ED   09/03/2016 2152 09/05/2016 1844 Full Code [id number]  Henreitta Leber, MD Inpatient   11/06/2014 0551 11/07/2014 1818 Full Code [id number]  Juluis Mire, MD Inpatient   08/10/2014 2023 08/12/2014 1623 Full Code [id number]  Theodoro Grist, MD Inpatient     Disposition Plan: Hopefully will be able to discharge home tomorrow  Antibiotics:  Rocephin  Zithromax  Time spent: 27 minutes  [place]

## 2018-03-15 NOTE — Progress Notes (Signed)
Subjective: Patient notes that his back pain is much improved.  He has been ambulating in the halls with assistance and coughing up phlegm with only mild discomfort.  He also notes that his breathing is much improved.   Objective: Vital signs in last 24 hours: Temp:  [97.7 F (36.5 C)-98.4 F (36.9 C)] 97.7 F (36.5 C) (01/05 0802) Pulse Rate:  [84-106] 87 (01/05 0802) Resp:  [18-22] 20 (01/05 0802) BP: (130-160)/(60-79) 144/68 (01/05 0802) SpO2:  [91 %-97 %] 97 % (01/05 0802)  Intake/Output from previous day: 01/04 0701 - 01/05 0700 In: 325.4 [P.O.:240; IV Piggyback:85.4] Out: 1975 [Urine:1375; Stool:600] Intake/Output this shift: Total I/O In: -  Out: 200 [Urine:200]  Recent Labs    03/12/18 2126 03/13/18 1458  HGB 16.0 13.3   Recent Labs    03/12/18 2126 03/13/18 1458  WBC 14.4* 7.1  RBC 4.96 4.14*  HCT 49.3 39.6  PLT 118* 87*   Recent Labs    03/12/18 2126 03/13/18 1458  NA 137  --   K 4.0  --   CL 105  --   CO2 16*  --   BUN 22  --   CREATININE 1.30* 0.97  GLUCOSE 154*  --   CALCIUM 9.5  --    No results for input(s): LABPT, INR in the last 72 hours.  Physical Exam: Mild tenderness to firm percussion over mid to lower thoracic spine especially along left paraspinal region.  Neurovascularly intact to both lower extremities.  Assessment: Subacute T8 compression fracture, improving symptomatically.  Plan: The treatment options are reviewed with the patient.  The patient feels that he is doing well at this point and does not wish to consider any further intervention.  Therefore, I will sign off at this time.    Thank you for asked me to participate in the care of this most pleasant man.  Please let me know if there is anything further that I can help with.   Marshall Cork Poggi 03/15/2018, 9:30 AM

## 2018-03-15 NOTE — Clinical Social Work Note (Signed)
Clinical Social Work Assessment  Patient Details  Name: [name] MRN: [id number] Date of Birth: [birth year]  Date of referral:  03/15/18               Reason for consult:  Facility Placement                Permission sought to share information with:  Chartered certified accountant granted to share information::  No  Name::        Agency::     Relationship::     Contact Information:     Housing/Transportation Living arrangements for the past 2 months:  Single Family Home Source of Information:  Patient, Medical Team Patient Interpreter Needed:  None Criminal Activity/Legal Involvement Pertinent to Current Situation/Hospitalization:  No - Comment as needed Significant Relationships:  Parents Lives with:  Parents Do you feel safe going back to the place where you live?  Yes Need for family participation in patient care:  No (Coment)  Care giving concerns: PT recommendation for SNF   Social Worker assessment / plan:  The CSW met with the patient at bedside to discuss discharge planning. The patient declined SNF placement citing that he has to care for his mother, and he has been to SNF recently and does not feel that he is in need of SNF. The patient also declined home health services.  According to Dr. Nicholaus Bloom consultation note, the patient has been ambulating in the halls and is feeling much improved. The patient seems safe to discharge home. The CSW is signing off. Please consult should needs arise.  Employment status:  Disabled (Comment on whether or not currently receiving Disability) Insurance information:  Medicaid In [place] PT Recommendations:  [place] / Referral to community resources:     Patient/Family's Response to care: The patient thanked the CSW.  Patient/Family's Understanding of and Emotional Response to Diagnosis, Current Treatment, and Prognosis:  The patient seems involved in his self-care and is able to speak on  his ongoing medical treatments that are upcoming.  Emotional Assessment Appearance:  Appears older than stated age Attitude/Demeanor/Rapport:  Gracious, Engaged Affect (typically observed):  Pleasant Orientation:  Oriented to Self, Oriented to Place, Oriented to Situation, Oriented to  Time Alcohol / Substance use:  Never Used Psych involvement (Current and /or in the community):  No (Comment)  Discharge Needs  Concerns to be addressed:  Discharge Planning Concerns, Care Coordination Readmission within the last 30 days:  No Current discharge risk:  Chronically ill Barriers to Discharge:  Continued Medical Work up   [employer], LCSW 03/15/2018, 4:25 PM

## 2018-03-16 LAB — GLUCOSE, CAPILLARY
GLUCOSE-CAPILLARY: 284 mg/dL — AB (ref 70–99)
Glucose-Capillary: 110 mg/dL — ABNORMAL HIGH (ref 70–99)

## 2018-03-16 LAB — URINE DRUGS OF ABUSE SCREEN W ALC, ROUTINE (REF LAB)
Amphetamines, Urine: NEGATIVE ng/mL
Barbiturate, Ur: NEGATIVE ng/mL
Cannabinoid Quant, Ur: NEGATIVE ng/mL
Cocaine (Metab.): NEGATIVE ng/mL
Ethanol U, Quan: NEGATIVE %
Opiate Quant, Ur: NEGATIVE ng/mL
PHENCYCLIDINE, UR: NEGATIVE ng/mL
Propoxyphene, Urine: NEGATIVE ng/mL

## 2018-03-16 LAB — DRUG PROFILE 799031: BENZODIAZEPINES: NEGATIVE

## 2018-03-16 LAB — METHADONE CONFIRMATION, URINE
METHADONE: POSITIVE — AB
Methadone Gc/Ms Conf: 13980 ng/mL

## 2018-03-16 LAB — HIV ANTIBODY (ROUTINE TESTING W REFLEX): HIV Screen 4th Generation wRfx: NONREACTIVE

## 2018-03-16 MED ORDER — HYDRALAZINE HCL 25 MG PO TABS: 25.0000 mg | ORAL_TABLET | Freq: Two times a day (BID) | ORAL | Status: DC

## 2018-03-16 MED ORDER — ADULT MULTIVITAMIN W/MINERALS CH: 1.0000 | ORAL_TABLET | Freq: Every day | ORAL | 0 refills | Status: DC

## 2018-03-16 MED ORDER — SPIRONOLACTONE 25 MG PO TABS: 50.0000 mg | ORAL_TABLET | Freq: Every day | ORAL | Status: AC

## 2018-03-16 MED ORDER — AMOXICILLIN-POT CLAVULANATE 875-125 MG PO TABS: 1.0000 | ORAL_TABLET | Freq: Two times a day (BID) | ORAL | 0 refills | Status: DC

## 2018-03-16 MED ORDER — ASCORBIC ACID 250 MG PO TABS: 250.0000 mg | ORAL_TABLET | Freq: Two times a day (BID) | ORAL | 0 refills | Status: AC

## 2018-03-16 MED ORDER — PREDNISONE 20 MG PO TABS: ORAL_TABLET | ORAL | 0 refills | Status: DC

## 2018-03-16 MED ORDER — AZITHROMYCIN 250 MG PO TABS
250.0000 mg | ORAL_TABLET | ORAL | Status: DC
  Administered 2018-03-16: 250 mg via ORAL
  Filled 2018-03-16: qty 1

## 2018-03-16 MED ORDER — AMOXICILLIN-POT CLAVULANATE 875-125 MG PO TABS: 1.0000 | ORAL_TABLET | Freq: Two times a day (BID) | ORAL | Status: DC

## 2018-03-16 MED ORDER — ENSURE ENLIVE PO LIQD: 237.0000 mL | Freq: Three times a day (TID) | ORAL | 0 refills | Status: DC

## 2018-03-16 MED ORDER — AZITHROMYCIN 250 MG PO TABS: ORAL_TABLET | ORAL | 0 refills | Status: AC

## 2018-03-16 NOTE — Care Management Note (Signed)
Case Management Note  Patient Details  Name: Achille Xiang MRN: [id number] Date of Birth: 06-Oct-1955  Subjective/Objective:                  Met with patient to discuss discharge [place] and can afford meds  Has a cane at home and has no DME needs Has transportation and still drives He takes care of his mother and lives with her Has Dr. Posey Pronto as PCP Declines HH and SNF He is up and independent walking and with ADLS Has a new colostomy and gets supplies [facility]   Action/Plan: Declines HH and SNF  Expected Discharge Date:  03/16/18               Expected Discharge Plan:  Home/Self Care  In-House Referral:     Discharge planning Services  CM Consult  Post Acute Care Choice:    Choice offered to:     DME Arranged:    DME Agency:     HH Arranged:    [place] Agency:     Status of Service:  In process, will continue to follow  If discussed at Long Length of Stay Meetings, dates discussed:    Additional Comments:  Su Hilt, RN 03/16/2018, 9:36 AM

## 2018-03-16 NOTE — Progress Notes (Signed)
Pt ambulated around nurses station twice without difficulty. Gait steady.

## 2018-03-16 NOTE — Progress Notes (Signed)
Pt wheeled to visitors entrance and assisted into vehicle.

## 2018-03-16 NOTE — Discharge Summary (Signed)
[facility] NAME: [name]    MR#:  [id number]  DATE OF BIRTH:  November 15, 1955  DATE OF ADMISSION:  03/12/2018 ADMITTING PHYSICIAN: Gorden Harms, MD  DATE OF DISCHARGE: 03/16/2018  2:11 PM  PRIMARY CARE PHYSICIAN: Revelo, Elyse Jarvis, MD    ADMISSION DIAGNOSIS:  Seizure-like activity ([place]) [R56.9] Pneumonia due to infectious organism, unspecified laterality, unspecified part of lung [J18.9] Back pain, unspecified back location, unspecified back pain laterality, unspecified chronicity [M54.9] Acute on chronic respiratory failure with hypoxemia ([place]) [J96.21]  DISCHARGE DIAGNOSIS:  Active Problems:   CAP (community acquired pneumonia)   Acute on chronic respiratory failure with hypoxemia ([place])   Protein-calorie malnutrition, severe   SECONDARY DIAGNOSIS:   Past Medical History:  Diagnosis Date  . Acute respiratory failure ([place])   . Anxiety unk  . Anxiety   . Arthritis   . Asthma   . BPH (benign prostatic hyperplasia)   . Chronic back pain unk  . Cirrhosis ([place])   . COPD (chronic obstructive pulmonary disease) ([place])    now on 2L home o2  . COPD (chronic obstructive pulmonary disease) ([place])   . Depression   . Diabetes mellitus without complication ([place])   . Dyspnea    DOE  . Encephalopathy   . Encephalopathy   . GERD (gastroesophageal reflux disease)   . Hep C w/o coma, chronic ([place])   . Hepatitis C   . Hepatitis C, chronic ([place])   . Hepatitis C, chronic ([name])    TO START MEDICATION AFTER ENDOSCOPY  . History of hiatal hernia   . HTN (hypertension)   . Hypertension    NO MEDS NOW  . Screening for malignant neoplasm of colon     HOSPITAL COURSE:   1.  Acute hypoxic respiratory failure.  Patient was initially on BiPAP and now breathing on room air. 2.  Pneumonia.  The patient was given Rocephin and Zithromax here in the hospital.  Will be switched over to a few more days of Augmentin and p.o. Zithromax  upon discharge home. 3.  COPD exacerbation.  Patient was given Solu-Medrol and the sugars were high so patient was switched over to oral prednisone.  2 more days of prednisone then discontinue. 4.  Questionable seizure event.  The patient was seen by neurology and they did not recommend any further evaluation and did not believe that this was a seizure. 5.  T8 compression fracture.  The patient did not want any further intervention.  The patient was seen in consultation by Dr. Roland Rack orthopedic surgery 6.  Type 2 diabetes mellitus.  Sugars will be high while on steroids I did give some low-dose Lantus at night here in the hospital but can go back on oral medications as outpatient and prednisone will quickly be discontinued. 7.  Chronic pain on methadone 8.  Patient had a recent surgery for bowel perforation and has a colostomy 9.  Hepatitis C with cirrhosis of the liver 10.  Weakness.  Physical therapy recommended rehab.  Patient states that he will go home.  I offered home health and the care manager offered home health but he refused. 11.  Chronic thrombocytopenia secondary to cirrhosis  DISCHARGE CONDITIONS:   Satisfactory  CONSULTS OBTAINED:  Treatment Team:  Leotis Pain, MD  DRUG ALLERGIES:  No Known Allergies  DISCHARGE MEDICATIONS:   Allergies as of 03/16/2018   No Known Allergies     Medication List    TAKE  these medications   albuterol 108 (90 Base) MCG/ACT inhaler Commonly known as:  PROVENTIL HFA;VENTOLIN HFA Inhale 2-4 puffs by mouth every 4 hours as needed for wheezing, cough, and/or shortness of breath   alfuzosin 10 MG 24 hr tablet Commonly known as:  UROXATRAL Take 10 mg by mouth 2 (two) times daily.   ALPRAZolam 0.5 MG tablet Commonly known as:  XANAX Take 0.5 mg by mouth 2 (two) times daily.   amoxicillin-clavulanate 875-125 MG tablet Commonly known as:  AUGMENTIN Take 1 tablet by mouth every 12 (twelve) hours. Start taking on:  March 17, 2018    ascorbic acid 250 MG tablet Commonly known as:  VITAMIN C Take 1 tablet (250 mg total) by mouth 2 (two) times daily.   azithromycin 250 MG tablet Commonly known as:  ZITHROMAX One tab po daily for two more days Start taking on:  March 17, 2018   budesonide-formoterol 160-4.5 MCG/ACT inhaler Commonly known as:  SYMBICORT Inhale 2 puffs into the lungs 2 (two) times daily.   carvedilol 6.25 MG tablet Commonly known as:  COREG Take 6.25 mg by mouth 2 (two) times daily with a meal.   EPCLUSA 400-100 MG Tabs Generic drug:  Sofosbuvir-Velpatasvir Take 1 tablet by mouth daily.   feeding supplement (ENSURE ENLIVE) Liqd Take 237 mLs by mouth 3 (three) times daily between meals.   fluticasone 50 MCG/ACT nasal spray Commonly known as:  FLONASE Place 2 sprays into both nostrils daily.   furosemide 20 MG tablet Commonly known as:  LASIX Take 1 tablet (20 mg total) by mouth daily.   hydrALAZINE 25 MG tablet Commonly known as:  APRESOLINE Take 1 tablet (25 mg total) by mouth 2 (two) times daily.   ipratropium-albuterol 0.5-2.5 (3) MG/3ML Soln Commonly known as:  DUONEB Take 3 mLs by nebulization every 6 (six) hours as needed (wheezing, shortness of breath).   metFORMIN 500 MG tablet Commonly known as:  GLUCOPHAGE Take 1 tablet (500 mg total) by mouth 2 (two) times daily with a meal.   methadone 10 MG tablet Commonly known as:  DOLOPHINE Take 20 mg by mouth 4 (four) times daily.   multivitamin with minerals Tabs tablet Take 1 tablet by mouth daily.   predniSONE 20 MG tablet Commonly known as:  DELTASONE Two tabs po daily for two days then stopr Start taking on:  March 17, 2018   rifaximin 550 MG Tabs tablet Commonly known as:  XIFAXAN Take 1 tablet (550 mg total) by mouth 2 (two) times daily.   spironolactone 25 MG tablet Commonly known as:  ALDACTONE Take 2 tablets (50 mg total) by mouth daily.   tiotropium 18 MCG inhalation capsule Commonly known as:   SPIRIVA Place 18 mcg into inhaler and inhale daily.        DISCHARGE INSTRUCTIONS:   Follow-up PMD 5 days  If you experience worsening of your admission symptoms, develop shortness of breath, life threatening emergency, suicidal or homicidal thoughts you must seek medical attention immediately by calling 911 or calling your MD immediately  if symptoms less severe.  You Must read complete instructions/literature along with all the possible adverse reactions/side effects for all the Medicines you take and that have been prescribed to you. Take any new Medicines after you have completely understood and accept all the possible adverse reactions/side effects.   Please note  You were cared for by a hospitalist during your hospital stay. If you have any questions about your discharge medications or the care you  received while you were in the hospital after you are discharged, you can call the unit and asked to speak with the hospitalist on call if the hospitalist that took care of you is not available. Once you are discharged, your primary care physician will handle any further medical issues. Please note that NO REFILLS for any discharge medications will be authorized once you are discharged, as it is imperative that you return to your primary care physician (or establish a relationship with a primary care physician if you do not have one) for your aftercare needs so that they can reassess your need for medications and monitor your lab values.    Today   CHIEF COMPLAINT:   Chief Complaint  Patient presents with  . Back Pain    HISTORY OF PRESENT ILLNESS:  [name]  is a 63 y.o. male presented with back pain and found to have pneumonia  VITAL SIGNS:  Blood pressure (!) 144/66, pulse 73, temperature 97.9 F (36.6 C), temperature source Oral, resp. rate 19, height 5\' 10"  (1.778 m), weight 58.9 kg, SpO2 93 %.   PHYSICAL EXAMINATION:  GENERAL:  63 y.o.-year-old patient lying in the  bed with no acute distress.  EYES: Pupils equal, round, reactive to light and accommodation. No scleral icterus. Extraocular muscles intact.  HEENT: Head atraumatic, normocephalic. Oropharynx and nasopharynx clear.  NECK:  Supple, no jugular venous distention. No thyroid enlargement, no tenderness.  LUNGS: Normal breath sounds bilaterally, no wheezing, rales,rhonchi or crepitation. No use of accessory muscles of respiration.  CARDIOVASCULAR: S1, S2 normal. No murmurs, rubs, or gallops.  ABDOMEN: Soft, non-tender, non-distended. Bowel sounds present. No organomegaly or mass.  Colostomy present EXTREMITIES: No pedal edema, cyanosis, or clubbing.  NEUROLOGIC: Cranial nerves II through XII are intact. Muscle strength 5/5 in all extremities. Sensation intact. Gait not checked.  PSYCHIATRIC: The patient is alert and oriented x 3.  SKIN: No obvious rash, lesion, or ulcer.   DATA REVIEW:   CBC Recent Labs  Lab 03/13/18 1458  WBC 7.1  HGB 13.3  HCT 39.6  PLT 87*    Chemistries  Recent Labs  Lab 03/12/18 2126 03/13/18 1458  NA 137  --   K 4.0  --   CL 105  --   CO2 16*  --   GLUCOSE 154*  --   BUN 22  --   CREATININE 1.30* 0.97  CALCIUM 9.5  --     Cardiac Enzymes Recent Labs  Lab 03/12/18 2126  TROPONINI <0.03    Microbiology Results  Results for orders placed or performed during the hospital encounter of 03/12/18  MRSA PCR Screening     Status: None   Collection Time: 03/13/18  6:56 AM  Result Value Ref Range Status   MRSA by PCR NEGATIVE NEGATIVE Final    Comment:        The GeneXpert MRSA Assay (FDA approved for NASAL specimens only), is one component of a comprehensive MRSA colonization surveillance program. It is not intended to diagnose MRSA infection nor to guide or monitor treatment for MRSA infections. Performed at [facility], [address] [place], [place] [address]   Blood culture (routine x 2)     Status: None (Preliminary result)    Collection Time: 03/13/18  1:07 PM  Result Value Ref Range Status   Specimen Description BLOOD FINGER  Final   Special Requests   Final    BOTTLES DRAWN AEROBIC AND ANAEROBIC Blood Culture adequate volume  Culture   Final    NO GROWTH 3 DAYS Performed at [facility], [address] [place], [place] [address]    Report Status PENDING  Incomplete  Blood culture (routine x 2)     Status: None (Preliminary result)   Collection Time: 03/13/18  1:07 PM  Result Value Ref Range Status   Specimen Description BLOOD FINGER  Final   Special Requests   Final    BOTTLES DRAWN AEROBIC ONLY Blood Culture adequate volume   Culture   Final    NO GROWTH 3 DAYS Performed at [facility], [address], [place], [place] [address]    Report Status PENDING  Incomplete     Management plans discussed with the patient, family and they are in agreement.  CODE STATUS:     Code Status Orders  (From admission, onward)         Start     Ordered   03/13/18 0134  Full code  Continuous     03/13/18 0134        Code Status History    Date Active Date Inactive Code Status Order ID Comments User Context   08/04/2017 0055 08/09/2017 1712 Full Code [id number]  Amelia Jo, MD Inpatient   07/21/2017 2303 07/26/2017 1802 Full Code [id number]  Bettey Costa, MD Inpatient   07/12/2017 0139 07/15/2017 1916 Full Code [id number]  Lance Coon, MD Inpatient   12/21/2016 2159 12/23/2016 1715 Full Code [id number]  Idelle Crouch, MD Inpatient   09/09/2016 2250 09/16/2016 1727 Full Code [id number]  Gladstone Lighter, MD Inpatient   09/09/2016 1934 09/09/2016 2250 Full Code [id number]  Nena Polio, MD ED   09/03/2016 2152 09/05/2016 1844 Full Code [id number]  Henreitta Leber, MD Inpatient   11/06/2014 0551 11/07/2014 1818 Full Code [id number]  Juluis Mire, MD Inpatient   08/10/2014 2023 08/12/2014 1623 Full Code [id number]  Theodoro Grist, MD Inpatient      TOTAL TIME TAKING CARE OF THIS PATIENT: 35  minutes.    Loletha Grayer M.D on 03/16/2018 at 2:21 PM  Between 7am to 6pm - Pager - [phone]  After 6pm go to [email or web address] - password [email or web address]  [employer] Office  [phone]  CC: Primary care physician; Theotis Burrow, MD

## 2018-03-16 NOTE — Progress Notes (Signed)
PICC line removed per policy with catheter intact. Sterile occlusive dressing applied over site.

## 2018-03-16 NOTE — Progress Notes (Signed)
Discharge instructions given. Pt verbalizes understanding of instructions given.

## 2018-03-18 LAB — CULTURE, BLOOD (ROUTINE X 2)
Culture: NO GROWTH
Culture: NO GROWTH
Special Requests: ADEQUATE
Special Requests: ADEQUATE

## 2018-04-04 ENCOUNTER — Encounter: Payer: Self-pay | Admitting: Emergency Medicine

## 2018-04-04 ENCOUNTER — Observation Stay
Admission: EM | Admit: 2018-04-04 | Discharge: 2018-04-05 | Disposition: A | Discharge status: Home / Self Care | Payer: Medicaid Other | Attending: Internal Medicine | Admitting: Internal Medicine

## 2018-04-04 ENCOUNTER — Other Ambulatory Visit: Payer: Self-pay

## 2018-04-04 DIAGNOSIS — E119 Type 2 diabetes mellitus without complications: Secondary | ICD-10-CM | POA: Diagnosis not present

## 2018-04-04 DIAGNOSIS — F329 Major depressive disorder, single episode, unspecified: Secondary | ICD-10-CM | POA: Insufficient documentation

## 2018-04-04 DIAGNOSIS — I10 Essential (primary) hypertension: Secondary | ICD-10-CM | POA: Insufficient documentation

## 2018-04-04 DIAGNOSIS — Z79891 Long term (current) use of opiate analgesic: Secondary | ICD-10-CM | POA: Insufficient documentation

## 2018-04-04 DIAGNOSIS — Z8619 Personal history of other infectious and parasitic diseases: Secondary | ICD-10-CM | POA: Insufficient documentation

## 2018-04-04 DIAGNOSIS — Z9981 Dependence on supplemental oxygen: Secondary | ICD-10-CM | POA: Diagnosis not present

## 2018-04-04 DIAGNOSIS — K922 Gastrointestinal hemorrhage, unspecified: Secondary | ICD-10-CM | POA: Diagnosis present

## 2018-04-04 DIAGNOSIS — Z79899 Other long term (current) drug therapy: Secondary | ICD-10-CM | POA: Diagnosis not present

## 2018-04-04 DIAGNOSIS — D696 Thrombocytopenia, unspecified: Secondary | ICD-10-CM | POA: Diagnosis present

## 2018-04-04 DIAGNOSIS — K219 Gastro-esophageal reflux disease without esophagitis: Secondary | ICD-10-CM | POA: Diagnosis present

## 2018-04-04 DIAGNOSIS — G8929 Other chronic pain: Secondary | ICD-10-CM | POA: Insufficient documentation

## 2018-04-04 DIAGNOSIS — J440 Chronic obstructive pulmonary disease with acute lower respiratory infection: Secondary | ICD-10-CM | POA: Diagnosis not present

## 2018-04-04 DIAGNOSIS — F419 Anxiety disorder, unspecified: Secondary | ICD-10-CM

## 2018-04-04 DIAGNOSIS — N400 Enlarged prostate without lower urinary tract symptoms: Secondary | ICD-10-CM | POA: Diagnosis not present

## 2018-04-04 DIAGNOSIS — K631 Perforation of intestine (nontraumatic): Secondary | ICD-10-CM | POA: Diagnosis present

## 2018-04-04 DIAGNOSIS — K449 Diaphragmatic hernia without obstruction or gangrene: Secondary | ICD-10-CM | POA: Insufficient documentation

## 2018-04-04 DIAGNOSIS — F1910 Other psychoactive substance abuse, uncomplicated: Secondary | ICD-10-CM | POA: Diagnosis present

## 2018-04-04 DIAGNOSIS — E43 Unspecified severe protein-calorie malnutrition: Secondary | ICD-10-CM | POA: Diagnosis present

## 2018-04-04 DIAGNOSIS — F1911 Other psychoactive substance abuse, in remission: Secondary | ICD-10-CM | POA: Diagnosis not present

## 2018-04-04 DIAGNOSIS — K7460 Unspecified cirrhosis of liver: Secondary | ICD-10-CM | POA: Diagnosis not present

## 2018-04-04 DIAGNOSIS — F1410 Cocaine abuse, uncomplicated: Secondary | ICD-10-CM | POA: Diagnosis not present

## 2018-04-04 DIAGNOSIS — Z933 Colostomy status: Secondary | ICD-10-CM | POA: Diagnosis not present

## 2018-04-04 DIAGNOSIS — Z7952 Long term (current) use of systemic steroids: Secondary | ICD-10-CM | POA: Insufficient documentation

## 2018-04-04 DIAGNOSIS — J189 Pneumonia, unspecified organism: Secondary | ICD-10-CM | POA: Insufficient documentation

## 2018-04-04 DIAGNOSIS — Z87891 Personal history of nicotine dependence: Secondary | ICD-10-CM | POA: Insufficient documentation

## 2018-04-04 DIAGNOSIS — E7220 Disorder of urea cycle metabolism, unspecified: Secondary | ICD-10-CM

## 2018-04-04 DIAGNOSIS — Z7951 Long term (current) use of inhaled steroids: Secondary | ICD-10-CM | POA: Insufficient documentation

## 2018-04-04 DIAGNOSIS — J449 Chronic obstructive pulmonary disease, unspecified: Secondary | ICD-10-CM | POA: Diagnosis present

## 2018-04-04 DIAGNOSIS — F32A Depression, unspecified: Secondary | ICD-10-CM

## 2018-04-04 LAB — COMPREHENSIVE METABOLIC PANEL
ALT: 47 U/L — ABNORMAL HIGH (ref 0–44)
AST: 41 U/L (ref 15–41)
Albumin: 3.3 g/dL — ABNORMAL LOW (ref 3.5–5.0)
Alkaline Phosphatase: 206 U/L — ABNORMAL HIGH (ref 38–126)
Anion gap: 8 (ref 5–15)
BUN: 32 mg/dL — ABNORMAL HIGH (ref 8–23)
CO2: 16 mmol/L — ABNORMAL LOW (ref 22–32)
Calcium: 9.1 mg/dL (ref 8.9–10.3)
Chloride: 108 mmol/L (ref 98–111)
Creatinine, Ser: 0.83 mg/dL (ref 0.61–1.24)
GFR calc Af Amer: >60 mL/min (ref >60)
GFR calc non Af Amer: >60 mL/min (ref >60)
Glucose, Bld: 250 mg/dL — ABNORMAL HIGH (ref 70–99)
Potassium: 5.1 mmol/L (ref 3.5–5.1)
Sodium: 132 mmol/L — ABNORMAL LOW (ref 135–145)
Total Bilirubin: 1 mg/dL (ref 0.3–1.2)
Total Protein: 6.4 g/dL — ABNORMAL LOW (ref 6.5–8.1)

## 2018-04-04 LAB — CBC WITH DIFFERENTIAL/PLATELET
ABS IMMATURE GRANULOCYTES: 0.05 10*3/uL (ref 0.00–0.07)
BASOS ABS: 0 10*3/uL (ref 0.0–0.1)
Basophils Relative: 0 %
Eosinophils Absolute: 0 10*3/uL (ref 0.0–0.5)
Eosinophils Relative: 0 %
HCT: 42.1 % (ref 39.0–52.0)
Hemoglobin: 13.3 g/dL (ref 13.0–17.0)
Immature Granulocytes: 1 %
Lymphocytes Relative: 10 %
Lymphs Abs: 1 10*3/uL (ref 0.7–4.0)
MCH: 32.6 pg (ref 26.0–34.0)
MCHC: 31.6 g/dL (ref 30.0–36.0)
MCV: 103.2 fL — ABNORMAL HIGH (ref 80.0–100.0)
Monocytes Absolute: 1.1 10*3/uL — ABNORMAL HIGH (ref 0.1–1.0)
Monocytes Relative: 11 %
NRBC: 0 % (ref 0.0–0.2)
Neutro Abs: 7.6 10*3/uL (ref 1.7–7.7)
Neutrophils Relative %: 78 %
Platelets: 115 10*3/uL — ABNORMAL LOW (ref 150–400)
RBC: 4.08 MIL/uL — ABNORMAL LOW (ref 4.22–5.81)
RDW: 14.8 % (ref 11.5–15.5)
WBC: 9.8 10*3/uL (ref 4.0–10.5)

## 2018-04-04 LAB — TYPE AND SCREEN
ABO/RH(D): O NEG
Antibody Screen: NEGATIVE

## 2018-04-04 LAB — HEMOGLOBIN AND HEMATOCRIT, BLOOD
HCT: 38.8 % — ABNORMAL LOW (ref 39.0–52.0)
Hemoglobin: 12.8 g/dL — ABNORMAL LOW (ref 13.0–17.0)

## 2018-04-04 LAB — PROTIME-INR
INR: 1.17
Prothrombin Time: 14.8 seconds (ref 11.4–15.2)

## 2018-04-04 LAB — AMMONIA: Ammonia: 71 umol/L — ABNORMAL HIGH (ref 9–35)

## 2018-04-04 MED ORDER — SODIUM CHLORIDE 0.9 % IV SOLN
INTRAVENOUS | Status: DC
  Administered 2018-04-04 – 2018-04-05 (×2): via INTRAVENOUS

## 2018-04-04 MED ORDER — FLUTICASONE PROPIONATE 50 MCG/ACT NA SUSP
2.0000 | Freq: Every day | NASAL | Status: DC
  Administered 2018-04-04: 2 via NASAL
  Filled 2018-04-04: qty 16

## 2018-04-04 MED ORDER — RIFAXIMIN 550 MG PO TABS
550.0000 mg | ORAL_TABLET | Freq: Two times a day (BID) | ORAL | Status: DC
  Administered 2018-04-04 – 2018-04-05 (×2): 550 mg via ORAL
  Filled 2018-04-04 (×3): qty 1

## 2018-04-04 MED ORDER — MOMETASONE FURO-FORMOTEROL FUM 200-5 MCG/ACT IN AERO
2.0000 | INHALATION_SPRAY | Freq: Two times a day (BID) | RESPIRATORY_TRACT | Status: DC
  Administered 2018-04-04 – 2018-04-05 (×2): 2 via RESPIRATORY_TRACT
  Filled 2018-04-04: qty 8.8

## 2018-04-04 MED ORDER — HYDRALAZINE HCL 25 MG PO TABS
25.0000 mg | ORAL_TABLET | Freq: Two times a day (BID) | ORAL | Status: DC
  Administered 2018-04-04 – 2018-04-05 (×2): 25 mg via ORAL
  Filled 2018-04-04 (×2): qty 1

## 2018-04-04 MED ORDER — SPIRONOLACTONE 25 MG PO TABS
50.0000 mg | ORAL_TABLET | Freq: Every day | ORAL | Status: DC
  Administered 2018-04-05: 50 mg via ORAL
  Filled 2018-04-04: qty 2

## 2018-04-04 MED ORDER — FUROSEMIDE 20 MG PO TABS
20.0000 mg | ORAL_TABLET | Freq: Every day | ORAL | Status: DC
  Administered 2018-04-05: 20 mg via ORAL
  Filled 2018-04-04: qty 1

## 2018-04-04 MED ORDER — PANTOPRAZOLE SODIUM 40 MG IV SOLR
40.0000 mg | INTRAVENOUS | Status: DC
  Administered 2018-04-04: 40 mg via INTRAVENOUS
  Filled 2018-04-04: qty 40

## 2018-04-04 MED ORDER — LACTULOSE 10 GM/15ML PO SOLN
30.0000 g | Freq: Once | ORAL | Status: DC
  Filled 2018-04-04: qty 60

## 2018-04-04 MED ORDER — TIOTROPIUM BROMIDE MONOHYDRATE 18 MCG IN CAPS
18.0000 ug | ORAL_CAPSULE | Freq: Every day | RESPIRATORY_TRACT | Status: DC
  Administered 2018-04-05: 18 ug via RESPIRATORY_TRACT
  Filled 2018-04-04: qty 5

## 2018-04-04 MED ORDER — IPRATROPIUM-ALBUTEROL 0.5-2.5 (3) MG/3ML IN SOLN: 3.0000 mL | Freq: Four times a day (QID) | RESPIRATORY_TRACT | Status: DC | PRN

## 2018-04-04 MED ORDER — METHADONE HCL 10 MG PO TABS
20.0000 mg | ORAL_TABLET | Freq: Four times a day (QID) | ORAL | Status: DC
  Administered 2018-04-04: 20 mg via ORAL
  Filled 2018-04-04 (×2): qty 2

## 2018-04-04 MED ORDER — ALFUZOSIN HCL ER 10 MG PO TB24
10.0000 mg | ORAL_TABLET | Freq: Two times a day (BID) | ORAL | Status: DC
  Administered 2018-04-04 – 2018-04-05 (×2): 10 mg via ORAL
  Filled 2018-04-04 (×3): qty 1

## 2018-04-04 MED ORDER — ALPRAZOLAM 0.5 MG PO TABS
0.5000 mg | ORAL_TABLET | Freq: Two times a day (BID) | ORAL | Status: DC
  Administered 2018-04-04: 0.5 mg via ORAL
  Filled 2018-04-04: qty 1

## 2018-04-04 MED ORDER — CARVEDILOL 6.25 MG PO TABS
6.2500 mg | ORAL_TABLET | Freq: Two times a day (BID) | ORAL | Status: DC
  Administered 2018-04-05: 6.25 mg via ORAL
  Filled 2018-04-04: qty 1

## 2018-04-04 NOTE — ED Notes (Signed)
IV access attempted x 2 by this RN. RN unsuccessful at obtaining IV access.

## 2018-04-04 NOTE — ED Triage Notes (Signed)
Pt to ED via POV c/o blood in ostomy bag. Pt states that this started around 1600. Pt states that he has emptied ostomy bag twice. Bag is about 1/4 full of blood at this time. Pt c/o some periumbilical abdominal pain. Pt is anxious on assessment but otherwise in NAD.
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
## 2018-04-05 LAB — BASIC METABOLIC PANEL
Anion gap: 3 — ABNORMAL LOW (ref 5–15)
BUN: 24 mg/dL — ABNORMAL HIGH (ref 8–23)
CO2: 20 mmol/L — ABNORMAL LOW (ref 22–32)
CREATININE: 0.77 mg/dL (ref 0.61–1.24)
Calcium: 8.9 mg/dL (ref 8.9–10.3)
Chloride: 113 mmol/L — ABNORMAL HIGH (ref 98–111)
GFR calc non Af Amer: >60 mL/min (ref >60)
Glucose, Bld: 136 mg/dL — ABNORMAL HIGH (ref 70–99)
Potassium: 4.4 mmol/L (ref 3.5–5.1)
Sodium: 136 mmol/L (ref 135–145)

## 2018-04-05 LAB — HEMOGLOBIN AND HEMATOCRIT, BLOOD
HCT: 36.3 % — ABNORMAL LOW (ref 39.0–52.0)
HCT: 39 % (ref 39.0–52.0)
Hemoglobin: 11.9 g/dL — ABNORMAL LOW (ref 13.0–17.0)
Hemoglobin: 12.7 g/dL — ABNORMAL LOW (ref 13.0–17.0)

## 2018-04-05 MED ORDER — METHADONE HCL 10 MG PO TABS
20.0000 mg | ORAL_TABLET | Freq: Four times a day (QID) | ORAL | Status: DC
  Administered 2018-04-05: 20 mg via ORAL
  Filled 2018-04-05: qty 2

## 2018-04-05 MED ORDER — ALPRAZOLAM 0.5 MG PO TABS
0.5000 mg | ORAL_TABLET | Freq: Three times a day (TID) | ORAL | Status: DC
  Administered 2018-04-05: 0.5 mg via ORAL
  Filled 2018-04-05: qty 1

## 2018-04-05 NOTE — Discharge Summary (Signed)
[name] at [facility] NAME: [name]    MR#:  [id number]  DATE OF BIRTH:  [birth year]  DATE OF ADMISSION:  04/04/2018 ADMITTING PHYSICIAN: Quintella Baton, MD  DATE OF DISCHARGE: 04/05/2018  PRIMARY CARE PHYSICIAN: Theotis Burrow, MD    ADMISSION DIAGNOSIS:  Hyperammonemia ([place]) [E72.20] Lower GI bleed [K92.2]  DISCHARGE DIAGNOSIS:  Principal Problem:   GI bleed Active Problems:   HTN (hypertension)   Polysubstance abuse ([name])   DM (diabetes mellitus) ([name])   COPD (chronic obstructive pulmonary disease) ([name])   Cocaine abuse ([name])   GERD (gastroesophageal reflux disease)   Cirrhosis ([name])   Perforated bowel ([name])   Thrombocytopenia ([name])   Protein-calorie malnutrition, severe   Anxiety and depression   GI bleeding   SECONDARY DIAGNOSIS:   Past Medical History:  Diagnosis Date  . Acute respiratory failure ([place])   . Anxiety unk  . Anxiety   . Arthritis   . Asthma   . BPH (benign prostatic hyperplasia)   . Chronic back pain unk  . Cirrhosis ([place])   . COPD (chronic obstructive pulmonary disease) ([place])    now on 2L home o2  . COPD (chronic obstructive pulmonary disease) ([place])   . Depression   . Diabetes mellitus without complication ([place])   . Dyspnea    DOE  . Encephalopathy   . Encephalopathy   . GERD (gastroesophageal reflux disease)   . Hep C w/o coma, chronic ([place])   . Hepatitis C   . Hepatitis C, chronic ([place])   . Hepatitis C, chronic ([name])    TO START MEDICATION AFTER ENDOSCOPY  . History of hiatal hernia   . HTN (hypertension)   . Hypertension    NO MEDS NOW  . Screening for malignant neoplasm of colon     HOSPITAL COURSE:   63 year old male with history of ischemic bowel and perforation status post colostomy bag September 2019 who presented to the emergency room due to noticing some blood in colostomy bag.   1.  Blood in colostomy bag: Patient has had no signs of blood in his colostomy  bag. Patient evaluated GI.  Patient will need outpatient follow-up with GI.  2.  Pneumonia: Patient was recently treated for pneumonia we will continue his antibiotics  3.  History of hepatitis C/cirrhosis: Continue Aldactone and Xifaxan  4.  COPD without signs of exacerbation: Continue inhalers  5.  Essential hypertension: Continue Coreg  6.  Chronic pain on methadone  DISCHARGE CONDITIONS AND DIET:   Stable for discharge on regular diet  CONSULTS OBTAINED:    DRUG ALLERGIES:  No Known Allergies  DISCHARGE MEDICATIONS:   Allergies as of 04/05/2018   No Known Allergies     Medication List    STOP taking these medications   amoxicillin-clavulanate 875-125 MG tablet Commonly known as:  AUGMENTIN   multivitamin with minerals Tabs tablet     TAKE these medications   albuterol 108 (90 Base) MCG/ACT inhaler Commonly known as:  PROVENTIL HFA;VENTOLIN HFA Inhale 2-4 puffs by mouth every 4 hours as needed for wheezing, cough, and/or shortness of breath   alfuzosin 10 MG 24 hr tablet Commonly known as:  UROXATRAL Take 10 mg by mouth 2 (two) times daily.   ALPRAZolam 0.5 MG tablet Commonly known as:  XANAX Take 0.5 mg by mouth 2 (two) times daily.   ascorbic acid 250 MG tablet Commonly known as:  VITAMIN C Take 1 tablet (250 mg total)  by mouth 2 (two) times daily.   azithromycin 250 MG tablet Commonly known as:  ZITHROMAX One tab po daily for two more days   budesonide-formoterol 160-4.5 MCG/ACT inhaler Commonly known as:  SYMBICORT Inhale 2 puffs into the lungs 2 (two) times daily.   carvedilol 6.25 MG tablet Commonly known as:  COREG Take 6.25 mg by mouth 2 (two) times daily with a meal.   feeding supplement (ENSURE ENLIVE) Liqd Take 237 mLs by mouth 3 (three) times daily between meals.   fluticasone 50 MCG/ACT nasal spray Commonly known as:  FLONASE Place 2 sprays into both nostrils daily.   furosemide 20 MG tablet Commonly known as:  LASIX Take 1  tablet (20 mg total) by mouth daily.   hydrALAZINE 25 MG tablet Commonly known as:  APRESOLINE Take 1 tablet (25 mg total) by mouth 2 (two) times daily.   ipratropium-albuterol 0.5-2.5 (3) MG/3ML Soln Commonly known as:  DUONEB Take 3 mLs by nebulization every 6 (six) hours as needed (wheezing, shortness of breath).   methadone 10 MG tablet Commonly known as:  DOLOPHINE Take 20 mg by mouth 4 (four) times daily.   predniSONE 20 MG tablet Commonly known as:  DELTASONE Two tabs po daily for two days then stopr What changed:    how much to take  how to take this   rifaximin 550 MG Tabs tablet Commonly known as:  XIFAXAN Take 1 tablet (550 mg total) by mouth 2 (two) times daily.   spironolactone 25 MG tablet Commonly known as:  ALDACTONE Take 2 tablets (50 mg total) by mouth daily.   tiotropium 18 MCG inhalation capsule Commonly known as:  SPIRIVA Place 18 mcg into inhaler and inhale daily.         Today   CHIEF COMPLAINT:   Patient reports no blood in his colostomy bag.  He feels that the blood was caused from eating rice crispy treats   VITAL SIGNS:  Blood pressure (!) 170/84, pulse 89, temperature 98.7 F (37.1 C), temperature source Oral, resp. rate 12, height 5\' 10"  (1.778 m), weight 57.9 kg, SpO2 93 %.   REVIEW OF SYSTEMS:  Review of Systems  Constitutional: Negative.  Negative for chills, fever and malaise/fatigue.  HENT: Negative.  Negative for ear discharge, ear pain, hearing loss, nosebleeds and sore throat.   Eyes: Negative.  Negative for blurred vision and pain.  Respiratory: Positive for cough. Negative for hemoptysis, shortness of breath and wheezing.   Cardiovascular: Negative.  Negative for chest pain, palpitations and leg swelling.  Gastrointestinal: Negative.  Negative for abdominal pain, blood in stool, diarrhea, nausea and vomiting.  Genitourinary: Negative.  Negative for dysuria.  Musculoskeletal: Negative.  Negative for back pain.  Skin:  Negative.   Neurological: Negative for dizziness, tremors, speech change, focal weakness, seizures and headaches.  Endo/Heme/Allergies: Negative.  Does not bruise/bleed easily.  Psychiatric/Behavioral: Negative.  Negative for depression, hallucinations and suicidal ideas.     PHYSICAL EXAMINATION:  GENERAL:  63 y.o.-year-old patient lying in the bed with no acute distress.  NECK:  Supple, no jugular venous distention. No thyroid enlargement, no tenderness.  LUNGS: Normal breath sounds bilaterally, no wheezing, rales,rhonchi  No use of accessory muscles of respiration.  CARDIOVASCULAR: S1, S2 normal. No murmurs, rubs, or gallops.  ABDOMEN: Soft, non-tender, non-distended. Bowel sounds present. No organomegaly or mass.  Colostomy bag without any blood shows normal stool color EXTREMITIES: No pedal edema, cyanosis, or clubbing.  PSYCHIATRIC: The patient is alert and oriented  x 3.  SKIN: No obvious rash, lesion, or ulcer.   DATA REVIEW:   CBC Recent Labs  Lab 04/04/18 1712  04/05/18 0845  WBC 9.8  --   --   HGB 13.3   < > 12.7*  HCT 42.1   < > 39.0  PLT 115*  --   --    < > = values in this interval not displayed.    Chemistries  Recent Labs  Lab 04/04/18 1712 04/05/18 0845  NA 132* 136  K 5.1 4.4  CL 108 113*  CO2 16* 20*  GLUCOSE 250* 136*  BUN 32* 24*  CREATININE 0.83 0.77  CALCIUM 9.1 8.9  AST 41  --   ALT 47*  --   ALKPHOS 206*  --   BILITOT 1.0  --     Cardiac Enzymes No results for input(s): TROPONINI in the last 168 hours.  Microbiology Results  @MICRORSLT48 @  RADIOLOGY:  No results found.    Allergies as of 04/05/2018   No Known Allergies     Medication List    STOP taking these medications   amoxicillin-clavulanate 875-125 MG tablet Commonly known as:  AUGMENTIN   multivitamin with minerals Tabs tablet     TAKE these medications   albuterol 108 (90 Base) MCG/ACT inhaler Commonly known as:  PROVENTIL HFA;VENTOLIN HFA Inhale 2-4 puffs  by mouth every 4 hours as needed for wheezing, cough, and/or shortness of breath   alfuzosin 10 MG 24 hr tablet Commonly known as:  UROXATRAL Take 10 mg by mouth 2 (two) times daily.   ALPRAZolam 0.5 MG tablet Commonly known as:  XANAX Take 0.5 mg by mouth 2 (two) times daily.   ascorbic acid 250 MG tablet Commonly known as:  VITAMIN C Take 1 tablet (250 mg total) by mouth 2 (two) times daily.   azithromycin 250 MG tablet Commonly known as:  ZITHROMAX One tab po daily for two more days   budesonide-formoterol 160-4.5 MCG/ACT inhaler Commonly known as:  SYMBICORT Inhale 2 puffs into the lungs 2 (two) times daily.   carvedilol 6.25 MG tablet Commonly known as:  COREG Take 6.25 mg by mouth 2 (two) times daily with a meal.   feeding supplement (ENSURE ENLIVE) Liqd Take 237 mLs by mouth 3 (three) times daily between meals.   fluticasone 50 MCG/ACT nasal spray Commonly known as:  FLONASE Place 2 sprays into both nostrils daily.   furosemide 20 MG tablet Commonly known as:  LASIX Take 1 tablet (20 mg total) by mouth daily.   hydrALAZINE 25 MG tablet Commonly known as:  APRESOLINE Take 1 tablet (25 mg total) by mouth 2 (two) times daily.   ipratropium-albuterol 0.5-2.5 (3) MG/3ML Soln Commonly known as:  DUONEB Take 3 mLs by nebulization every 6 (six) hours as needed (wheezing, shortness of breath).   methadone 10 MG tablet Commonly known as:  DOLOPHINE Take 20 mg by mouth 4 (four) times daily.   predniSONE 20 MG tablet Commonly known as:  DELTASONE Two tabs po daily for two days then stopr What changed:    how much to take  how to take this   rifaximin 550 MG Tabs tablet Commonly known as:  XIFAXAN Take 1 tablet (550 mg total) by mouth 2 (two) times daily.   spironolactone 25 MG tablet Commonly known as:  ALDACTONE Take 2 tablets (50 mg total) by mouth daily.   tiotropium 18 MCG inhalation capsule Commonly known as:  SPIRIVA Place 18 mcg into  inhaler and  inhale daily.         Management plans discussed with the patient and he is in agreement. Stable for discharge   Patient should follow up with pcp and GI  CODE STATUS:     Code Status Orders  (From admission, onward)         Start     Ordered   04/04/18 2043  Full code  Continuous     04/04/18 2043        Code Status History    Date Active Date Inactive Code Status Order ID Comments User Context   03/13/2018 0134 03/16/2018 1723 Full Code [id number]  Gorden Harms, MD ED   08/04/2017 0055 08/09/2017 1712 Full Code [id number]  Amelia Jo, MD Inpatient   07/21/2017 2303 07/26/2017 1802 Full Code [id number]  Bettey Costa, MD Inpatient   07/12/2017 0139 07/15/2017 1916 Full Code [id number]  Lance Coon, MD Inpatient   12/21/2016 2159 12/23/2016 1715 Full Code [id number]  Idelle Crouch, MD Inpatient   09/09/2016 2250 09/16/2016 1727 Full Code [id number]  Gladstone Lighter, MD Inpatient   09/09/2016 1934 09/09/2016 2250 Full Code [id number]  Nena Polio, MD ED   09/03/2016 2152 09/05/2016 1844 Full Code [id number]  Henreitta Leber, MD Inpatient   11/06/2014 0551 11/07/2014 1818 Full Code [id number]  Juluis Mire, MD Inpatient   08/10/2014 2023 08/12/2014 1623 Full Code [id number]  Theodoro Grist, MD Inpatient      TOTAL TIME TAKING CARE OF THIS PATIENT: 38 minutes.    Note: This dictation was prepared with Dragon dictation along with smaller phrase technology. Any transcriptional errors that result from this process are unintentional.  Rochele Lueck M.D on 04/05/2018 at 10:51 AM  Between 7am to 6pm - Pager - [phone] After 6pm go to [email or web address] - password [email or web address]  Office  [phone]  CC: Primary care physician; Theotis Burrow, MD

## 2018-04-05 NOTE — Progress Notes (Addendum)
Patient became upset because he felt that the doctors were not prescribing the correct dosage of Xanax that he takes at home and told other staff to get his nurse in there and take his IV out because he was leaving. When nurse arrived to room patient began stating " you know how to read my chart it's in the chart and you still sent someone in here and didn't get my dose right". Nurse explained to patient that she had contacted provider earlier during the morning to get his medications changed and they had done that. Patient requested that nurse remove IV stating " get this out of my arm", nurse removed IV and then patient began asking nurse for his paperwork that he gave pharmacist when he arrived, nurse told patient that she would look in chart for the papers, upon nurse and nurse tech searching for papers no papers that the patient spoke of were located. Nurse called pharmacy to see if they had the patient's papers and was given another number to call in which was called and there was no answer. During the nurses attempt to locate patient's papers the patient was standing outside of his room door yelling " I need my papers, this doesn't make any damn sense, i'm going to call my sister and she is going to be pissed, you can go in the chart and see every damn thing, your not doing your job right". Nurse asked patient on several attempts to calm down and he would not at this time nurse called security. Patient then stated to nurse " my sister is on the phone and she wants to speak to you now!" Nurse told patient that he would need to ask her to hold for a moment. At this time security arrived to the floor and nurse began telling them what was happening at this time nurse and security approached patient's room door and patient held phone out telling nurse that his sister wanted to speak with her, security began speaking with patient and nurse spoke with the patient's sister and the sister stated " he said he is being  discharged why is he being discharged if they don't know why he was bleeding and what were his lab results", nurse explained to patient's sister that patient was upset about paperwork he claimed was given to staff here that is unable to be located and that she would have the provider call her. Patient's sister began stating " well yes he needs those papers and pleae have the doctor call me because they say they will and they never do". Nurse assured to give message to provider and then ended the call. When nurse turned around the patient had left with security after asking front desk staff to call a cab for him. Discharge teaching was done post removal of IV, patient did not wait for GI to come and see him.

## 2018-04-05 NOTE — Care Management Note (Signed)
Case Management Note  Patient Details  Name: [name] MRN: [id number] Date of Birth: 04-14-1955  Subjective/Objective:  Patient placed under observation for GI Bleed, patient reports that he saw blood in his ostomy bag and it looked like a lot of blood.  He reports he became scared and came to the hospital.  Patient reports that he has been taking care of this ostomy for a month now and he can do it himself.  Patient refused home health services.  Patient gets his ostomy supplies from [facility] and he has a supply order coming this week.  Patient reports that he is taking a cab home at discharge.  Patient denies any discharge needs.  RNCM signed off.   Doran Clay RN BSN [phone]                  Action/Plan:   Expected Discharge Date:  04/05/18               Expected Discharge Plan:  Home/Self Care  In-House Referral:     Discharge planning Services  CM Consult  Post Acute Care Choice:    Choice offered to:     DME Arranged:    DME Agency:     HH Arranged:    HH Agency:     Status of Service:  Completed, signed off  If discussed at [employer] of Stay Meetings, dates discussed:    Additional Comments:  Shelbie Hutching, RN 04/05/2018, 11:04 AM

## 2018-04-06 ENCOUNTER — Inpatient Hospital Stay
Admission: EM | Admit: 2018-04-06 | Discharge: 2018-05-10 | DRG: 193 | Disposition: E | Payer: Medicaid Other | Attending: Internal Medicine | Admitting: Internal Medicine

## 2018-04-06 ENCOUNTER — Encounter: Payer: Self-pay | Admitting: *Deleted

## 2018-04-06 ENCOUNTER — Emergency Department: Payer: Medicaid Other

## 2018-04-06 ENCOUNTER — Other Ambulatory Visit: Payer: Self-pay

## 2018-04-06 DIAGNOSIS — I10 Essential (primary) hypertension: Secondary | ICD-10-CM | POA: Diagnosis present

## 2018-04-06 DIAGNOSIS — R059 Cough, unspecified: Secondary | ICD-10-CM

## 2018-04-06 DIAGNOSIS — Z7951 Long term (current) use of inhaled steroids: Secondary | ICD-10-CM

## 2018-04-06 DIAGNOSIS — Z961 Presence of intraocular lens: Secondary | ICD-10-CM | POA: Diagnosis present

## 2018-04-06 DIAGNOSIS — R0602 Shortness of breath: Secondary | ICD-10-CM

## 2018-04-06 DIAGNOSIS — I469 Cardiac arrest, cause unspecified: Secondary | ICD-10-CM | POA: Diagnosis not present

## 2018-04-06 DIAGNOSIS — J9621 Acute and chronic respiratory failure with hypoxia: Secondary | ICD-10-CM | POA: Diagnosis present

## 2018-04-06 DIAGNOSIS — E875 Hyperkalemia: Secondary | ICD-10-CM | POA: Diagnosis not present

## 2018-04-06 DIAGNOSIS — R627 Adult failure to thrive: Secondary | ICD-10-CM | POA: Diagnosis present

## 2018-04-06 DIAGNOSIS — Z8042 Family history of malignant neoplasm of prostate: Secondary | ICD-10-CM

## 2018-04-06 DIAGNOSIS — G8929 Other chronic pain: Secondary | ICD-10-CM | POA: Diagnosis present

## 2018-04-06 DIAGNOSIS — J439 Emphysema, unspecified: Secondary | ICD-10-CM | POA: Diagnosis present

## 2018-04-06 DIAGNOSIS — Z9842 Cataract extraction status, left eye: Secondary | ICD-10-CM

## 2018-04-06 DIAGNOSIS — Z9981 Dependence on supplemental oxygen: Secondary | ICD-10-CM

## 2018-04-06 DIAGNOSIS — N400 Enlarged prostate without lower urinary tract symptoms: Secondary | ICD-10-CM | POA: Diagnosis present

## 2018-04-06 DIAGNOSIS — K219 Gastro-esophageal reflux disease without esophagitis: Secondary | ICD-10-CM | POA: Diagnosis present

## 2018-04-06 DIAGNOSIS — Z841 Family history of disorders of kidney and ureter: Secondary | ICD-10-CM

## 2018-04-06 DIAGNOSIS — J441 Chronic obstructive pulmonary disease with (acute) exacerbation: Secondary | ICD-10-CM | POA: Diagnosis present

## 2018-04-06 DIAGNOSIS — R05 Cough: Secondary | ICD-10-CM

## 2018-04-06 DIAGNOSIS — B379 Candidiasis, unspecified: Secondary | ICD-10-CM | POA: Diagnosis not present

## 2018-04-06 DIAGNOSIS — Z9049 Acquired absence of other specified parts of digestive tract: Secondary | ICD-10-CM

## 2018-04-06 DIAGNOSIS — Z933 Colostomy status: Secondary | ICD-10-CM

## 2018-04-06 DIAGNOSIS — M549 Dorsalgia, unspecified: Secondary | ICD-10-CM | POA: Diagnosis present

## 2018-04-06 DIAGNOSIS — Z87891 Personal history of nicotine dependence: Secondary | ICD-10-CM

## 2018-04-06 DIAGNOSIS — J209 Acute bronchitis, unspecified: Secondary | ICD-10-CM | POA: Diagnosis present

## 2018-04-06 DIAGNOSIS — Y95 Nosocomial condition: Secondary | ICD-10-CM | POA: Diagnosis present

## 2018-04-06 DIAGNOSIS — F410 Panic disorder [episodic paroxysmal anxiety] without agoraphobia: Secondary | ICD-10-CM | POA: Diagnosis not present

## 2018-04-06 DIAGNOSIS — K449 Diaphragmatic hernia without obstruction or gangrene: Secondary | ICD-10-CM | POA: Diagnosis present

## 2018-04-06 DIAGNOSIS — B182 Chronic viral hepatitis C: Secondary | ICD-10-CM | POA: Diagnosis present

## 2018-04-06 DIAGNOSIS — A419 Sepsis, unspecified organism: Secondary | ICD-10-CM

## 2018-04-06 DIAGNOSIS — K7030 Alcoholic cirrhosis of liver without ascites: Secondary | ICD-10-CM | POA: Diagnosis present

## 2018-04-06 DIAGNOSIS — Z681 Body mass index (BMI) 19 or less, adult: Secondary | ICD-10-CM

## 2018-04-06 DIAGNOSIS — E119 Type 2 diabetes mellitus without complications: Secondary | ICD-10-CM | POA: Diagnosis present

## 2018-04-06 DIAGNOSIS — J189 Pneumonia, unspecified organism: Principal | ICD-10-CM | POA: Diagnosis present

## 2018-04-06 DIAGNOSIS — Z79891 Long term (current) use of opiate analgesic: Secondary | ICD-10-CM

## 2018-04-06 DIAGNOSIS — Z9841 Cataract extraction status, right eye: Secondary | ICD-10-CM

## 2018-04-06 DIAGNOSIS — K921 Melena: Secondary | ICD-10-CM | POA: Diagnosis not present

## 2018-04-06 DIAGNOSIS — M542 Cervicalgia: Secondary | ICD-10-CM

## 2018-04-06 LAB — CBC WITH DIFFERENTIAL/PLATELET
Abs Immature Granulocytes: 0.05 10*3/uL (ref 0.00–0.07)
Basophils Absolute: 0 10*3/uL (ref 0.0–0.1)
Basophils Relative: 0 %
Eosinophils Absolute: 0 10*3/uL (ref 0.0–0.5)
Eosinophils Relative: 0 %
HCT: 38.5 % — ABNORMAL LOW (ref 39.0–52.0)
Hemoglobin: 12.9 g/dL — ABNORMAL LOW (ref 13.0–17.0)
Immature Granulocytes: 1 %
Lymphocytes Relative: 6 %
Lymphs Abs: 0.6 10*3/uL — ABNORMAL LOW (ref 0.7–4.0)
MCH: 32.8 pg (ref 26.0–34.0)
MCHC: 33.5 g/dL (ref 30.0–36.0)
MCV: 98 fL (ref 80.0–100.0)
Monocytes Absolute: 0.8 10*3/uL (ref 0.1–1.0)
Monocytes Relative: 8 %
Neutro Abs: 8.4 10*3/uL — ABNORMAL HIGH (ref 1.7–7.7)
Neutrophils Relative %: 85 %
Platelets: 98 10*3/uL — ABNORMAL LOW (ref 150–400)
RBC: 3.93 MIL/uL — ABNORMAL LOW (ref 4.22–5.81)
RDW: 14.7 % (ref 11.5–15.5)
WBC: 9.8 10*3/uL (ref 4.0–10.5)
nRBC: 0 % (ref 0.0–0.2)

## 2018-04-06 LAB — INFLUENZA PANEL BY PCR (TYPE A & B)
Influenza A By PCR: NEGATIVE
Influenza B By PCR: NEGATIVE

## 2018-04-06 MED ORDER — DILTIAZEM HCL 25 MG/5ML IV SOLN
20.0000 mg | Freq: Once | INTRAVENOUS | Status: AC
  Administered 2018-04-07: 20 mg via INTRAVENOUS
  Filled 2018-04-06: qty 5

## 2018-04-06 MED ORDER — SODIUM CHLORIDE 0.9 % IV BOLUS
1000.0000 mL | Freq: Once | INTRAVENOUS | Status: AC
  Administered 2018-04-07: 1000 mL via INTRAVENOUS

## 2018-04-06 MED ORDER — IPRATROPIUM-ALBUTEROL 0.5-2.5 (3) MG/3ML IN SOLN
3.0000 mL | Freq: Once | RESPIRATORY_TRACT | Status: AC
  Administered 2018-04-06: 3 mL via RESPIRATORY_TRACT
  Filled 2018-04-06: qty 3

## 2018-04-06 MED ORDER — METHYLPREDNISOLONE SODIUM SUCC 125 MG IJ SOLR
125.0000 mg | Freq: Once | INTRAMUSCULAR | Status: AC
  Administered 2018-04-07: 125 mg via INTRAVENOUS
  Filled 2018-04-06: qty 2

## 2018-04-06 MED ORDER — VANCOMYCIN HCL IN DEXTROSE 1-5 GM/200ML-% IV SOLN
1000.0000 mg | Freq: Once | INTRAVENOUS | Status: DC
  Administered 2018-04-07: 1000 mg via INTRAVENOUS
  Filled 2018-04-06: qty 200

## 2018-04-06 MED ORDER — SODIUM CHLORIDE 0.9 % IV SOLN
1.0000 g | Freq: Once | INTRAVENOUS | Status: AC
  Administered 2018-04-07: 1 g via INTRAVENOUS
  Filled 2018-04-06: qty 1

## 2018-04-06 MED ORDER — MAGNESIUM SULFATE 2 GM/50ML IV SOLN
2.0000 g | Freq: Once | INTRAVENOUS | Status: AC
  Administered 2018-04-07: 2 g via INTRAVENOUS
  Filled 2018-04-06: qty 50

## 2018-04-06 NOTE — ED Notes (Signed)
Pt brought in via ems from home with sob.  No chest pain or chest tightness.  Pt reports he broke his back 2 weeks ago and continues to have back pain.  Pt reports coughing up green phlegm.  Pt alert speech clear. Pt in afib on monitor at 132.

## 2018-04-06 NOTE — ED Notes (Signed)
Report given to Butch RN 

## 2018-04-06 NOTE — ED Notes (Addendum)
IV team at bedside. IV team advised that pt needs blood work and cultures at this time.

## 2018-04-06 NOTE — ED Notes (Signed)
Pt stuck multiple times with no success.

## 2018-04-06 NOTE — ED Notes (Signed)
RN attempting IV at this time. Pt started yelling at RN that she was tearing up his skin. Pt advised to calm down and we could start the IV and he could receive his medications. Pt still yelling at RN. Medic Tammy at bedside and MD Alfred Levins. Unable to obtain IV at this time. IV team consult to be ordered.

## 2018-04-06 NOTE — ED Triage Notes (Signed)
Pt comes via EMS with c/o SOB and breathing difficulties.  Pt arrives tachy and using suction. Pt states cough with chucks of productive green phelgm.
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
## 2018-04-07 DIAGNOSIS — E119 Type 2 diabetes mellitus without complications: Secondary | ICD-10-CM | POA: Diagnosis present

## 2018-04-07 DIAGNOSIS — K219 Gastro-esophageal reflux disease without esophagitis: Secondary | ICD-10-CM | POA: Diagnosis present

## 2018-04-07 DIAGNOSIS — I10 Essential (primary) hypertension: Secondary | ICD-10-CM | POA: Diagnosis present

## 2018-04-07 DIAGNOSIS — K7030 Alcoholic cirrhosis of liver without ascites: Secondary | ICD-10-CM | POA: Diagnosis present

## 2018-04-07 DIAGNOSIS — Z841 Family history of disorders of kidney and ureter: Secondary | ICD-10-CM | POA: Diagnosis not present

## 2018-04-07 DIAGNOSIS — K922 Gastrointestinal hemorrhage, unspecified: Secondary | ICD-10-CM | POA: Diagnosis not present

## 2018-04-07 DIAGNOSIS — J189 Pneumonia, unspecified organism: Secondary | ICD-10-CM | POA: Diagnosis present

## 2018-04-07 DIAGNOSIS — Z9049 Acquired absence of other specified parts of digestive tract: Secondary | ICD-10-CM | POA: Diagnosis not present

## 2018-04-07 DIAGNOSIS — B182 Chronic viral hepatitis C: Secondary | ICD-10-CM | POA: Diagnosis present

## 2018-04-07 DIAGNOSIS — Z9841 Cataract extraction status, right eye: Secondary | ICD-10-CM | POA: Diagnosis not present

## 2018-04-07 DIAGNOSIS — K921 Melena: Secondary | ICD-10-CM | POA: Diagnosis not present

## 2018-04-07 DIAGNOSIS — R05 Cough: Secondary | ICD-10-CM | POA: Diagnosis present

## 2018-04-07 DIAGNOSIS — Y95 Nosocomial condition: Secondary | ICD-10-CM | POA: Diagnosis present

## 2018-04-07 DIAGNOSIS — J9621 Acute and chronic respiratory failure with hypoxia: Secondary | ICD-10-CM | POA: Diagnosis present

## 2018-04-07 DIAGNOSIS — J441 Chronic obstructive pulmonary disease with (acute) exacerbation: Secondary | ICD-10-CM | POA: Diagnosis not present

## 2018-04-07 DIAGNOSIS — K449 Diaphragmatic hernia without obstruction or gangrene: Secondary | ICD-10-CM | POA: Diagnosis present

## 2018-04-07 DIAGNOSIS — Z933 Colostomy status: Secondary | ICD-10-CM | POA: Diagnosis not present

## 2018-04-07 DIAGNOSIS — Z8042 Family history of malignant neoplasm of prostate: Secondary | ICD-10-CM | POA: Diagnosis not present

## 2018-04-07 DIAGNOSIS — M549 Dorsalgia, unspecified: Secondary | ICD-10-CM | POA: Diagnosis present

## 2018-04-07 DIAGNOSIS — Z9842 Cataract extraction status, left eye: Secondary | ICD-10-CM | POA: Diagnosis not present

## 2018-04-07 DIAGNOSIS — A419 Sepsis, unspecified organism: Secondary | ICD-10-CM | POA: Diagnosis not present

## 2018-04-07 DIAGNOSIS — Z681 Body mass index (BMI) 19 or less, adult: Secondary | ICD-10-CM | POA: Diagnosis not present

## 2018-04-07 DIAGNOSIS — I469 Cardiac arrest, cause unspecified: Secondary | ICD-10-CM | POA: Diagnosis not present

## 2018-04-07 DIAGNOSIS — N400 Enlarged prostate without lower urinary tract symptoms: Secondary | ICD-10-CM | POA: Diagnosis present

## 2018-04-07 DIAGNOSIS — Z87891 Personal history of nicotine dependence: Secondary | ICD-10-CM | POA: Diagnosis not present

## 2018-04-07 DIAGNOSIS — Z9981 Dependence on supplemental oxygen: Secondary | ICD-10-CM | POA: Diagnosis not present

## 2018-04-07 DIAGNOSIS — Z961 Presence of intraocular lens: Secondary | ICD-10-CM | POA: Diagnosis present

## 2018-04-07 DIAGNOSIS — G8929 Other chronic pain: Secondary | ICD-10-CM | POA: Diagnosis present

## 2018-04-07 DIAGNOSIS — Z7951 Long term (current) use of inhaled steroids: Secondary | ICD-10-CM | POA: Diagnosis not present

## 2018-04-07 LAB — BASIC METABOLIC PANEL
Anion gap: 4 — ABNORMAL LOW (ref 5–15)
Anion gap: 5 (ref 5–15)
BUN: 23 mg/dL (ref 8–23)
BUN: 22 mg/dL (ref 8–23)
CO2: 24 mmol/L (ref 22–32)
CO2: 20 mmol/L — ABNORMAL LOW (ref 22–32)
Calcium: 9 mg/dL (ref 8.9–10.3)
Calcium: 8.5 mg/dL — ABNORMAL LOW (ref 8.9–10.3)
Chloride: 104 mmol/L (ref 98–111)
Chloride: 108 mmol/L (ref 98–111)
Creatinine, Ser: 0.98 mg/dL (ref 0.61–1.24)
Creatinine, Ser: 0.8 mg/dL (ref 0.61–1.24)
GFR calc Af Amer: >60 mL/min (ref >60)
GFR calc Af Amer: >60 mL/min (ref >60)
GFR calc non Af Amer: >60 mL/min (ref >60)
Glucose, Bld: 303 mg/dL — ABNORMAL HIGH (ref 70–99)
Glucose, Bld: 252 mg/dL — ABNORMAL HIGH (ref 70–99)
POTASSIUM: 4.8 mmol/L (ref 3.5–5.1)
Potassium: 4.7 mmol/L (ref 3.5–5.1)
Sodium: 132 mmol/L — ABNORMAL LOW (ref 135–145)
Sodium: 133 mmol/L — ABNORMAL LOW (ref 135–145)

## 2018-04-07 LAB — TROPONIN I: Troponin I: <0.03 ng/mL (ref <0.03)

## 2018-04-07 LAB — CBC
HCT: 37.2 % — ABNORMAL LOW (ref 39.0–52.0)
Hemoglobin: 12.5 g/dL — ABNORMAL LOW (ref 13.0–17.0)
MCH: 33.3 pg (ref 26.0–34.0)
MCHC: 33.6 g/dL (ref 30.0–36.0)
MCV: 99.2 fL (ref 80.0–100.0)
Platelets: 100 10*3/uL — ABNORMAL LOW (ref 150–400)
RBC: 3.75 MIL/uL — ABNORMAL LOW (ref 4.22–5.81)
RDW: 14.8 % (ref 11.5–15.5)
WBC: 11 10*3/uL — ABNORMAL HIGH (ref 4.0–10.5)
nRBC: 0 % (ref 0.0–0.2)

## 2018-04-07 LAB — PROCALCITONIN: Procalcitonin: <0.1 ng/mL

## 2018-04-07 LAB — GLUCOSE, CAPILLARY
GLUCOSE-CAPILLARY: 235 mg/dL — AB (ref 70–99)
GLUCOSE-CAPILLARY: 342 mg/dL — AB (ref 70–99)
Glucose-Capillary: 242 mg/dL — ABNORMAL HIGH (ref 70–99)
Glucose-Capillary: 358 mg/dL — ABNORMAL HIGH (ref 70–99)
Glucose-Capillary: 317 mg/dL — ABNORMAL HIGH (ref 70–99)

## 2018-04-07 LAB — LACTIC ACID, PLASMA: Lactic Acid, Venous: 1.6 mmol/L (ref 0.5–1.9)

## 2018-04-07 MED ORDER — ALPRAZOLAM 0.5 MG PO TABS: 0.5000 mg | ORAL_TABLET | Freq: Two times a day (BID) | ORAL | Status: DC

## 2018-04-07 MED ORDER — MOMETASONE FURO-FORMOTEROL FUM 200-5 MCG/ACT IN AERO
2.0000 | INHALATION_SPRAY | Freq: Two times a day (BID) | RESPIRATORY_TRACT | Status: DC
  Administered 2018-04-07 – 2018-04-08 (×3): 2 via RESPIRATORY_TRACT
  Filled 2018-04-07: qty 8.8

## 2018-04-07 MED ORDER — ALPRAZOLAM 1 MG PO TABS
1.0000 mg | ORAL_TABLET | Freq: Three times a day (TID) | ORAL | Status: DC
  Administered 2018-04-07 – 2018-04-12 (×19): 1 mg via ORAL
  Filled 2018-04-07 (×15): qty 1
  Filled 2018-04-07: qty 2
  Filled 2018-04-07 (×3): qty 1

## 2018-04-07 MED ORDER — METHADONE HCL 10 MG PO TABS
20.0000 mg | ORAL_TABLET | Freq: Three times a day (TID) | ORAL | Status: DC
  Administered 2018-04-07 (×2): 20 mg via ORAL
  Filled 2018-04-07 (×3): qty 2

## 2018-04-07 MED ORDER — ONDANSETRON HCL 4 MG PO TABS: 4.0000 mg | ORAL_TABLET | Freq: Four times a day (QID) | ORAL | Status: DC | PRN

## 2018-04-07 MED ORDER — VANCOMYCIN HCL 10 G IV SOLR
1250.0000 mg | Freq: Once | INTRAVENOUS | Status: DC
  Filled 2018-04-07: qty 1250

## 2018-04-07 MED ORDER — METHADONE HCL 10 MG PO TABS
20.0000 mg | ORAL_TABLET | Freq: Four times a day (QID) | ORAL | Status: DC
  Administered 2018-04-08 – 2018-04-12 (×19): 20 mg via ORAL
  Filled 2018-04-07 (×19): qty 2

## 2018-04-07 MED ORDER — INSULIN ASPART 100 UNIT/ML ~~LOC~~ SOLN
5.0000 [IU] | Freq: Once | SUBCUTANEOUS | Status: AC
  Administered 2018-04-07: 5 [IU] via SUBCUTANEOUS
  Filled 2018-04-07: qty 1

## 2018-04-07 MED ORDER — METHYLPREDNISOLONE SODIUM SUCC 125 MG IJ SOLR
60.0000 mg | Freq: Two times a day (BID) | INTRAMUSCULAR | Status: DC
  Administered 2018-04-07 – 2018-04-08 (×2): 60 mg via INTRAVENOUS
  Filled 2018-04-07 (×2): qty 2

## 2018-04-07 MED ORDER — ACETAMINOPHEN 650 MG RE SUPP: 650.0000 mg | Freq: Four times a day (QID) | RECTAL | Status: DC | PRN

## 2018-04-07 MED ORDER — TIOTROPIUM BROMIDE MONOHYDRATE 18 MCG IN CAPS
18.0000 ug | ORAL_CAPSULE | Freq: Every day | RESPIRATORY_TRACT | Status: DC
  Administered 2018-04-07 – 2018-04-08 (×2): 18 ug via RESPIRATORY_TRACT
  Filled 2018-04-07: qty 5

## 2018-04-07 MED ORDER — ONDANSETRON HCL 4 MG/2ML IJ SOLN: 4.0000 mg | Freq: Four times a day (QID) | INTRAMUSCULAR | Status: DC | PRN

## 2018-04-07 MED ORDER — ACETAMINOPHEN 325 MG PO TABS
650.0000 mg | ORAL_TABLET | Freq: Four times a day (QID) | ORAL | Status: DC | PRN
  Administered 2018-04-09 – 2018-04-10 (×2): 650 mg via ORAL
  Filled 2018-04-07 (×3): qty 2

## 2018-04-07 MED ORDER — HYDROCOD POLST-CPM POLST ER 10-8 MG/5ML PO SUER: 5.0000 mL | Freq: Once | ORAL | Status: DC

## 2018-04-07 MED ORDER — HEPARIN SODIUM (PORCINE) 5000 UNIT/ML IJ SOLN
5000.0000 [IU] | Freq: Three times a day (TID) | INTRAMUSCULAR | Status: DC
  Administered 2018-04-07 – 2018-04-11 (×15): 5000 [IU] via SUBCUTANEOUS
  Filled 2018-04-07 (×15): qty 1

## 2018-04-07 MED ORDER — HYDROCOD POLST-CPM POLST ER 10-8 MG/5ML PO SUER
5.0000 mL | Freq: Two times a day (BID) | ORAL | Status: DC
  Administered 2018-04-07 – 2018-04-12 (×11): 5 mL via ORAL
  Filled 2018-04-07 (×11): qty 5

## 2018-04-07 MED ORDER — RIFAXIMIN 550 MG PO TABS
550.0000 mg | ORAL_TABLET | Freq: Two times a day (BID) | ORAL | Status: DC
  Administered 2018-04-07 – 2018-04-12 (×11): 550 mg via ORAL
  Filled 2018-04-07 (×14): qty 1

## 2018-04-07 MED ORDER — SPIRONOLACTONE 25 MG PO TABS
50.0000 mg | ORAL_TABLET | Freq: Every day | ORAL | Status: DC
  Administered 2018-04-07 – 2018-04-08 (×2): 50 mg via ORAL
  Filled 2018-04-07 (×2): qty 2

## 2018-04-07 MED ORDER — METHADONE HCL 10 MG PO TABS
20.0000 mg | ORAL_TABLET | Freq: Three times a day (TID) | ORAL | Status: DC
  Administered 2018-04-07 (×2): 20 mg via ORAL
  Filled 2018-04-07 (×2): qty 2

## 2018-04-07 MED ORDER — INSULIN GLARGINE 100 UNIT/ML ~~LOC~~ SOLN
12.0000 [IU] | Freq: Every day | SUBCUTANEOUS | Status: DC
  Administered 2018-04-07 – 2018-04-09 (×3): 12 [IU] via SUBCUTANEOUS
  Filled 2018-04-07 (×4): qty 0.12

## 2018-04-07 MED ORDER — ENOXAPARIN SODIUM 40 MG/0.4ML ~~LOC~~ SOLN: 40.0000 mg | SUBCUTANEOUS | Status: DC

## 2018-04-07 MED ORDER — BENZONATATE 100 MG PO CAPS
200.0000 mg | ORAL_CAPSULE | Freq: Three times a day (TID) | ORAL | Status: DC | PRN
  Administered 2018-04-10 – 2018-04-12 (×2): 200 mg via ORAL
  Filled 2018-04-07 (×2): qty 2

## 2018-04-07 MED ORDER — METHYLPREDNISOLONE SODIUM SUCC 125 MG IJ SOLR
60.0000 mg | Freq: Four times a day (QID) | INTRAMUSCULAR | Status: DC
  Administered 2018-04-07 (×2): 60 mg via INTRAVENOUS
  Filled 2018-04-07 (×2): qty 2

## 2018-04-07 MED ORDER — GUAIFENESIN-DM 100-10 MG/5ML PO SYRP
5.0000 mL | ORAL_SOLUTION | ORAL | Status: DC | PRN
  Administered 2018-04-07 – 2018-04-10 (×2): 5 mL via ORAL
  Filled 2018-04-07 (×4): qty 5

## 2018-04-07 MED ORDER — INSULIN ASPART 100 UNIT/ML ~~LOC~~ SOLN
0.0000 [IU] | Freq: Three times a day (TID) | SUBCUTANEOUS | Status: DC
  Administered 2018-04-07: 3 [IU] via SUBCUTANEOUS
  Administered 2018-04-07: 7 [IU] via SUBCUTANEOUS
  Administered 2018-04-07: 9 [IU] via SUBCUTANEOUS
  Administered 2018-04-07 – 2018-04-08 (×2): 7 [IU] via SUBCUTANEOUS
  Administered 2018-04-08: 3 [IU] via SUBCUTANEOUS
  Filled 2018-04-07 (×7): qty 1

## 2018-04-07 MED ORDER — IPRATROPIUM-ALBUTEROL 0.5-2.5 (3) MG/3ML IN SOLN
3.0000 mL | RESPIRATORY_TRACT | Status: DC | PRN
  Administered 2018-04-12 (×2): 3 mL via RESPIRATORY_TRACT
  Filled 2018-04-07 (×2): qty 3

## 2018-04-07 NOTE — ED Notes (Signed)
Only one set of blood cultures able to be collected by the IV team d/t pt's being a very difficult stick.
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
## 2018-04-07 NOTE — Progress Notes (Signed)
Inpatient Diabetes Program Recommendations  AACE/ADA: New Consensus Statement on Inpatient Glycemic Control (2015)  Target Ranges:  Prepandial:   less than 140 mg/dL      Peak postprandial:   less than 180 mg/dL (1-2 hours)      Critically ill patients:  140 - 180 mg/dL   Results for NGOC, DAUGHTRIDGE (MRN [id number]) as of 04/07/2018 11:52  Ref. Range 04/07/2018 02:25 04/07/2018 08:14 04/07/2018 11:48  Glucose-Capillary Latest Ref Range: 70 - 99 mg/dL 235 (H)  5 units NOVOLOG  242 (H)  3 units NOVOLOG  358 (H)      Admit Sepsis/ Pneumonia   History: DM, COPD  Home DM Meds: None listed  Current Orders: Novolog Sensitive Correction Scale/ SSI (0-9 units) TID AC + HS       Pt got 125 mg Solumedrol X 1 dose at Midnight and then started on Solumedrol 60 mg Q6H at 4am.  CBG rose sharply to 358 mg/dl at 12pm today.     MD- Please consider the following in-hospital insulin adjustments while patient getting IV Steroids:  1. Start Lantus 12 units QHS (0.2 units/kg dosing based on weight of 59 kg)  2. Start Novolog Meal Coverage: Novolog 3 units TID with meals  (Please add the following Hold Parameters: Hold if pt eats <50% of meal, Hold if pt NPO)    --Will follow patient during hospitalization--  Wyn Quaker RN, MSN, CDE Diabetes Coordinator Inpatient Glycemic Control Team Team Pager: [phone] (8a-5p)

## 2018-04-07 NOTE — Progress Notes (Signed)
Pt requesting his methadone, med. is scheduled for 0000,pt demanding medication in a verbally abuse way since 2200, I explained to pt that the earliest I can bring is at 11. Pt is very angry.

## 2018-04-07 NOTE — Progress Notes (Addendum)
[facility] [place] NAME: [name]    MR#:  [id number]  DATE OF BIRTH:  1955/06/17  SUBJECTIVE:  CHIEF COMPLAINT:   Chief Complaint  Patient presents with  . Shortness of Breath  . Respiratory Distress   -Complains of cough, shortness of breath and wheezing still. -Complains of weakness  REVIEW OF SYSTEMS:  Review of Systems  Constitutional: Positive for malaise/fatigue. Negative for chills and fever.  HENT: Negative for ear discharge, hearing loss and nosebleeds.   Eyes: Negative for blurred vision and double vision.  Respiratory: Positive for cough and shortness of breath. Negative for wheezing.   Cardiovascular: Negative for chest pain and palpitations.  Gastrointestinal: Positive for abdominal pain and nausea. Negative for constipation, diarrhea and vomiting.  Genitourinary: Negative for dysuria.  Musculoskeletal: Negative for myalgias.  Neurological: Negative for dizziness, focal weakness, seizures, weakness and headaches.  Psychiatric/Behavioral: Negative for depression.    DRUG ALLERGIES:  No Known Allergies  VITALS:  Blood pressure (!) 172/77, pulse 88, temperature 98.3 F (36.8 C), temperature source Oral, resp. rate 20, height 5\' 10"  (1.778 m), weight 59 kg, SpO2 95 %.  PHYSICAL EXAMINATION:  Physical Exam  GENERAL:  63 y.o.-year-old patient lying in the bed with no acute distress.  EYES: Pupils equal, round, reactive to light and accommodation. No scleral icterus. Extraocular muscles intact.  HEENT: Head atraumatic, normocephalic. Oropharynx and nasopharynx clear.  NECK:  Supple, no jugular venous distention. No thyroid enlargement, no tenderness.  LUNGS: Normal breath sounds bilaterally, scattered expiratory wheezing all over the lung fields, no rales,rhonchi or crepitation. No use of accessory muscles of respiration.  CARDIOVASCULAR: S1, S2 normal. No  rubs, or gallops. 2/6 systolic murmur  present ABDOMEN: Soft, nontender, nondistended. Bowel sounds present. No organomegaly or mass.  Colostomy bag in place and right lower quadrant. EXTREMITIES: No pedal edema, cyanosis, or clubbing.  NEUROLOGIC: Cranial nerves II through XII are intact. Muscle strength 5/5 in all extremities. Sensation intact. Gait not checked.  PSYCHIATRIC: The patient is alert and oriented x 3.  SKIN: No obvious rash, lesion, or ulcer.    LABORATORY PANEL:   CBC Recent Labs  Lab 04/07/18 0231  WBC 11.0*  HGB 12.5*  HCT 37.2*  PLT 100*   ------------------------------------------------------------------------------------------------------------------  Chemistries  Recent Labs  Lab 04/04/18 1712  04/07/18 0231  NA 132*   < > 133*  K 5.1   < > 4.8  CL 108   < > 108  CO2 16*   < > 20*  GLUCOSE 250*   < > 252*  BUN 32*   < > 22  CREATININE 0.83   < > 0.80  CALCIUM 9.1   < > 8.5*  AST 41  --   --   ALT 47*  --   --   ALKPHOS 206*  --   --   BILITOT 1.0  --   --    < > = values in this interval not displayed.   ------------------------------------------------------------------------------------------------------------------  Cardiac Enzymes Recent Labs  Lab 04/03/2018 2149  TROPONINI <0.03   ------------------------------------------------------------------------------------------------------------------  RADIOLOGY:  Dg Chest Port 1 View  Result Date: 04/03/2018 CLINICAL DATA:  Cough and shortness of breath. EXAM: PORTABLE CHEST 1 VIEW COMPARISON:  03/12/2018 FINDINGS: Emphysema with chronic bronchial thickening. Unchanged heart size and mediastinal contours, bilateral hilar prominence again seen. Aortic atherosclerosis. Vague slightly streaky opacity in the right midlung zone. No confluent airspace disease. Mild interstitial opacities are again  seen. No pneumothorax or large pleural effusion. No acute osseous abnormalities. IMPRESSION: 1. Vague slightly streaky opacities in the right  midlung zone, may reflect atelectasis or early pneumonia. 2. Emphysema and chronic bronchial thickening. 3. Aortic Atherosclerosis (ICD10-I70.0) and Emphysema (ICD10-J43.9). Electronically Signed   By: Keith Rake M.D.   On: 03/15/2018 22:17    EKG:   Orders placed or performed during the hospital encounter of 03/15/2018  . ED EKG  . ED EKG  . EKG 12-Lead  . EKG 12-Lead    ASSESSMENT AND PLAN:   63 year old male with past medical history significant for chronic respiratory failure secondary to COPD on 2 L home oxygen, hypertension, diabetes mellitus, history of ischemic bowel status post colostomy, hepatitis C presents from home secondary to worsening shortness of breath.  1.  Sepsis-thought secondary to right lower lobe pneumonia -Sepsis ruled out.  Procalcitonin is pending -Received vancomycin and cefepime in the ED. -Patient always has chronic right-sided streaky infiltrate on his previous chest x-rays. -Follow-up fevers and WBC  2.  COPD exacerbation-also triggered by anxiety -Continue steroids.  Dose has been adjusted -Continue cough medications and inhalers.  3.  Liver cirrhosis-stable.  Continue Xifaxan -On Aldactone  4.  Anxiety-on Xanax  5.  DVT prophylaxis-on subcutaneous heparin  6. Chronic pain- continue methadone  Encourage ambulation   All the records are reviewed and case discussed with Care Management/Social Workerr. Management plans discussed with the patient, family and they are in agreement.  CODE STATUS: Full Code  TOTAL TIME TAKING CARE OF THIS PATIENT: 38 minutes.   POSSIBLE D/C IN 1-2 DAYS, DEPENDING ON CLINICAL CONDITION.   Gladstone Lighter M.D on 04/07/2018 at 1:44 PM  Between 7am to 6pm - Pager - [phone]  After 6pm go to [email or web address] - password [email or web address]  Office  [phone]  CC: Primary care physician; Dianne Dun, MD
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
## 2018-04-08 LAB — CBC
HCT: 34.8 % — ABNORMAL LOW (ref 39.0–52.0)
HEMOGLOBIN: 11.8 g/dL — AB (ref 13.0–17.0)
MCH: 32.7 pg (ref 26.0–34.0)
MCHC: 33.9 g/dL (ref 30.0–36.0)
MCV: 96.4 fL (ref 80.0–100.0)
Platelets: 92 10*3/uL — ABNORMAL LOW (ref 150–400)
RBC: 3.61 MIL/uL — ABNORMAL LOW (ref 4.22–5.81)
RDW: 14.6 % (ref 11.5–15.5)
WBC: 11.6 10*3/uL — ABNORMAL HIGH (ref 4.0–10.5)
nRBC: 0 % (ref 0.0–0.2)

## 2018-04-08 LAB — PROCALCITONIN: Procalcitonin: <0.1 ng/mL

## 2018-04-08 LAB — BASIC METABOLIC PANEL
Anion gap: 4 — ABNORMAL LOW (ref 5–15)
BUN: 24 mg/dL — ABNORMAL HIGH (ref 8–23)
CO2: 21 mmol/L — ABNORMAL LOW (ref 22–32)
CREATININE: 0.8 mg/dL (ref 0.61–1.24)
Calcium: 8.9 mg/dL (ref 8.9–10.3)
Chloride: 105 mmol/L (ref 98–111)
GFR calc Af Amer: >60 mL/min (ref >60)
GFR calc non Af Amer: >60 mL/min (ref >60)
Glucose, Bld: 232 mg/dL — ABNORMAL HIGH (ref 70–99)
Potassium: 5.1 mmol/L (ref 3.5–5.1)
Sodium: 130 mmol/L — ABNORMAL LOW (ref 135–145)

## 2018-04-08 LAB — GLUCOSE, CAPILLARY
Glucose-Capillary: 223 mg/dL — ABNORMAL HIGH (ref 70–99)
Glucose-Capillary: 350 mg/dL — ABNORMAL HIGH (ref 70–99)
Glucose-Capillary: 323 mg/dL — ABNORMAL HIGH (ref 70–99)
Glucose-Capillary: 333 mg/dL — ABNORMAL HIGH (ref 70–99)

## 2018-04-08 MED ORDER — INSULIN ASPART 100 UNIT/ML ~~LOC~~ SOLN
0.0000 [IU] | Freq: Three times a day (TID) | SUBCUTANEOUS | Status: DC
  Administered 2018-04-08: 11 [IU] via SUBCUTANEOUS
  Administered 2018-04-09: 5 [IU] via SUBCUTANEOUS
  Administered 2018-04-09: 8 [IU] via SUBCUTANEOUS
  Administered 2018-04-09: 5 [IU] via SUBCUTANEOUS
  Administered 2018-04-10: 8 [IU] via SUBCUTANEOUS
  Administered 2018-04-10 (×2): 15 [IU] via SUBCUTANEOUS
  Administered 2018-04-11: 3 [IU] via SUBCUTANEOUS
  Administered 2018-04-11: 5 [IU] via SUBCUTANEOUS
  Administered 2018-04-11 – 2018-04-12 (×3): 8 [IU] via SUBCUTANEOUS
  Administered 2018-04-12: 11 [IU] via SUBCUTANEOUS
  Filled 2018-04-08 (×13): qty 1

## 2018-04-08 MED ORDER — IPRATROPIUM-ALBUTEROL 0.5-2.5 (3) MG/3ML IN SOLN
3.0000 mL | Freq: Four times a day (QID) | RESPIRATORY_TRACT | Status: DC
  Administered 2018-04-09 (×2): 3 mL via RESPIRATORY_TRACT
  Filled 2018-04-08 (×2): qty 3

## 2018-04-08 MED ORDER — ALFUZOSIN HCL ER 10 MG PO TB24
10.0000 mg | ORAL_TABLET | Freq: Every day | ORAL | Status: DC
  Administered 2018-04-09 – 2018-04-12 (×4): 10 mg via ORAL
  Filled 2018-04-08 (×5): qty 1

## 2018-04-08 MED ORDER — ARFORMOTEROL TARTRATE 15 MCG/2ML IN NEBU
15.0000 ug | INHALATION_SOLUTION | Freq: Two times a day (BID) | RESPIRATORY_TRACT | Status: DC
  Administered 2018-04-08 – 2018-04-12 (×8): 15 ug via RESPIRATORY_TRACT
  Filled 2018-04-08 (×11): qty 2

## 2018-04-08 MED ORDER — METHYLPREDNISOLONE SODIUM SUCC 40 MG IJ SOLR
40.0000 mg | Freq: Two times a day (BID) | INTRAMUSCULAR | Status: DC
  Administered 2018-04-08 – 2018-04-11 (×7): 40 mg via INTRAVENOUS
  Filled 2018-04-08 (×7): qty 1

## 2018-04-08 MED ORDER — CARVEDILOL 6.25 MG PO TABS
6.2500 mg | ORAL_TABLET | Freq: Two times a day (BID) | ORAL | Status: DC
  Administered 2018-04-08 – 2018-04-09 (×2): 6.25 mg via ORAL
  Filled 2018-04-08 (×2): qty 1

## 2018-04-08 MED ORDER — FUROSEMIDE 20 MG PO TABS
20.0000 mg | ORAL_TABLET | Freq: Every day | ORAL | Status: DC
  Administered 2018-04-08 – 2018-04-12 (×5): 20 mg via ORAL
  Filled 2018-04-08 (×5): qty 1

## 2018-04-08 MED ORDER — VITAMIN C 500 MG PO TABS
250.0000 mg | ORAL_TABLET | Freq: Every day | ORAL | Status: DC
  Administered 2018-04-08 – 2018-04-12 (×5): 250 mg via ORAL
  Filled 2018-04-08 (×5): qty 1

## 2018-04-08 MED ORDER — IPRATROPIUM-ALBUTEROL 0.5-2.5 (3) MG/3ML IN SOLN
3.0000 mL | Freq: Four times a day (QID) | RESPIRATORY_TRACT | Status: DC
  Administered 2018-04-08 (×2): 3 mL via RESPIRATORY_TRACT
  Filled 2018-04-08 (×2): qty 3

## 2018-04-08 MED ORDER — INSULIN ASPART 100 UNIT/ML ~~LOC~~ SOLN
0.0000 [IU] | Freq: Every day | SUBCUTANEOUS | Status: DC
  Administered 2018-04-08 – 2018-04-09 (×2): 4 [IU] via SUBCUTANEOUS
  Administered 2018-04-10: 3 [IU] via SUBCUTANEOUS
  Administered 2018-04-11: 2 [IU] via SUBCUTANEOUS
  Filled 2018-04-08 (×4): qty 1

## 2018-04-08 MED ORDER — FLUTICASONE PROPIONATE 50 MCG/ACT NA SUSP
2.0000 | Freq: Every day | NASAL | Status: DC
  Administered 2018-04-08 – 2018-04-12 (×3): 2 via NASAL
  Filled 2018-04-08 (×2): qty 16

## 2018-04-08 MED ORDER — BUDESONIDE 0.5 MG/2ML IN SUSP
0.5000 mg | Freq: Two times a day (BID) | RESPIRATORY_TRACT | Status: DC
  Administered 2018-04-08 – 2018-04-12 (×8): 0.5 mg via RESPIRATORY_TRACT
  Filled 2018-04-08 (×9): qty 2

## 2018-04-08 MED ORDER — INSULIN ASPART 100 UNIT/ML ~~LOC~~ SOLN
4.0000 [IU] | Freq: Three times a day (TID) | SUBCUTANEOUS | Status: DC
  Administered 2018-04-08 – 2018-04-10 (×6): 4 [IU] via SUBCUTANEOUS
  Filled 2018-04-08 (×6): qty 1

## 2018-04-08 NOTE — Plan of Care (Signed)
  Problem: Education: Goal: Knowledge of General Education information will improve Description Including pain rating scale, medication(s)/side effects and non-pharmacologic comfort measures Outcome: Progressing   Problem: Health Behavior/Discharge Planning: Goal: Ability to manage health-related needs will improve Outcome: Progressing   Problem: Clinical Measurements: Goal: Ability to maintain clinical measurements within normal limits will improve Outcome: Progressing Goal: Cardiovascular complication will be avoided Outcome: Progressing   Problem: Activity: Goal: Risk for activity intolerance will decrease Outcome: Progressing   Problem: Pain Managment: Goal: General experience of comfort will improve Outcome: Progressing Note:  Pt on Methadone for chronic pain   Problem: Clinical Measurements: Goal: Ability to maintain a body temperature in the normal range will improve Outcome: Progressing   Problem: Clinical Measurements: Goal: Will remain free from infection Outcome: Not Progressing Note:  Remains on IV antibiotics, remains afebrile Goal: Diagnostic test results will improve Outcome: Not Progressing Note:  Sodium 130, Potassium 5.1, WBC 11.1 this morning Goal: Respiratory complications will improve Outcome: Not Progressing Note:  Pt has productive cough, lungs are diminished with scattered Rhonchi   Problem: Activity: Goal: Ability to tolerate increased activity will improve Outcome: Not Progressing   Problem: Respiratory: Goal: Ability to maintain adequate ventilation will improve Outcome: Not Progressing

## 2018-04-08 NOTE — Progress Notes (Signed)
Inpatient Diabetes Program Recommendations  AACE/ADA: New Consensus Statement on Inpatient Glycemic Control  Target Ranges:  Prepandial:   less than 140 mg/dL      Peak postprandial:   less than 180 mg/dL (1-2 hours)      Critically ill patients:  140 - 180 mg/dL   Results for ODELL, CHOUNG (MRN [id number]) as of 04/08/2018 11:48  Ref. Range 04/07/2018 08:14 04/07/2018 11:48 04/07/2018 16:35 04/07/2018 20:59 04/08/2018 08:39  Glucose-Capillary Latest Ref Range: 70 - 99 mg/dL 242 (H) 358 (H) 342 (H) 317 (H) 223 (H)   Review of Glycemic Control  Diabetes history: DM2 Outpatient Diabetes medications: None Current orders for Inpatient glycemic control: Lantus 12 units QHS, Novolog 0-9 units AC&HS; Solumedrol 60 mg Q12H  Inpatient Diabetes Program Recommendations:   Insulin - Basal: If steroids are continued as ordered, please consider increasing Lantus to 14 units QHS.  Insulin - Meal Coverage: If steroids are continued as ordered, please consider ordering Novolog 4 units TID with meals for meal coverage if patient eats at least 50% of meals.  Thanks, Barnie Alderman, RN, MSN, CDE Diabetes Coordinator Inpatient Diabetes Program [phone] (Team Pager from 8am to 5pm)

## 2018-04-08 NOTE — Progress Notes (Signed)
[facility] [place] NAME: [name]    MR#:  [id number]  DATE OF BIRTH:  05-22-1955  SUBJECTIVE:  CHIEF COMPLAINT:   Chief Complaint  Patient presents with  . Shortness of Breath  . Respiratory Distress   -Complains of cough with greenish phlegm, endorses shortness of breath and wheezing are better than yesterday -Complains of weakness  REVIEW OF SYSTEMS:  Review of Systems  Constitutional: Positive for malaise/fatigue. Negative for chills and fever.  HENT: Negative for ear discharge, hearing loss and nosebleeds.   Eyes: Negative for blurred vision and double vision.  Respiratory: Positive for cough and shortness of breath. Negative for wheezing.   Cardiovascular: Negative for chest pain and palpitations.  Gastrointestinal: Positive for abdominal pain and nausea. Negative for constipation, diarrhea and vomiting.  Genitourinary: Negative for dysuria.  Musculoskeletal: Negative for myalgias.  Neurological: Negative for dizziness, focal weakness, seizures, weakness and headaches.  Psychiatric/Behavioral: Negative for depression.    DRUG ALLERGIES:  No Known Allergies  VITALS:  Blood pressure (!) 165/72, pulse 64, temperature 97.9 F (36.6 C), temperature source Oral, resp. rate 16, height 5\' 10"  (1.778 m), weight 62.3 kg, SpO2 96 %.  PHYSICAL EXAMINATION:  Physical Exam  GENERAL:  63 y.o.-year-old patient lying in the bed with no acute distress.  EYES: Pupils equal, round, reactive to light and accommodation. No scleral icterus. Extraocular muscles intact.  HEENT: Head atraumatic, normocephalic. Oropharynx and nasopharynx clear.  NECK:  Supple, no jugular venous distention. No thyroid enlargement, no tenderness.  LUNGS: Normal breath sounds bilaterally, scattered expiratory wheezing all over the lung fields, no rales,rhonchi or crepitation. No use of accessory muscles of respiration.  CARDIOVASCULAR: S1, S2 normal. No   rubs, or gallops. 2/6 systolic murmur present ABDOMEN: Soft, nontender, nondistended. Bowel sounds present. No organomegaly or mass.  Colostomy bag in place and right lower quadrant. EXTREMITIES: No pedal edema, cyanosis, or clubbing.  NEUROLOGIC: Cranial nerves II through XII are intact. Muscle strength 5/5 in all extremities. Sensation intact. Gait not checked.  PSYCHIATRIC: The patient is alert and oriented x 3.  SKIN: No obvious rash, lesion, or ulcer.    LABORATORY PANEL:   CBC Recent Labs  Lab 04/08/18 0328  WBC 11.6*  HGB 11.8*  HCT 34.8*  PLT 92*   ------------------------------------------------------------------------------------------------------------------  Chemistries  Recent Labs  Lab 04/04/18 1712  04/08/18 0328  NA 132*   < > 130*  K 5.1   < > 5.1  CL 108   < > 105  CO2 16*   < > 21*  GLUCOSE 250*   < > 232*  BUN 32*   < > 24*  CREATININE 0.83   < > 0.80  CALCIUM 9.1   < > 8.9  AST 41  --   --   ALT 47*  --   --   ALKPHOS 206*  --   --   BILITOT 1.0  --   --    < > = values in this interval not displayed.   ------------------------------------------------------------------------------------------------------------------  Cardiac Enzymes Recent Labs  Lab 04/05/2018 2149  TROPONINI <0.03   ------------------------------------------------------------------------------------------------------------------  RADIOLOGY:  Dg Chest Port 1 View  Result Date: 03/19/2018 CLINICAL DATA:  Cough and shortness of breath. EXAM: PORTABLE CHEST 1 VIEW COMPARISON:  03/12/2018 FINDINGS: Emphysema with chronic bronchial thickening. Unchanged heart size and mediastinal contours, bilateral hilar prominence again seen. Aortic atherosclerosis. Vague slightly streaky opacity in the right midlung zone. No confluent  airspace disease. Mild interstitial opacities are again seen. No pneumothorax or large pleural effusion. No acute osseous abnormalities. IMPRESSION: 1. Vague  slightly streaky opacities in the right midlung zone, may reflect atelectasis or early pneumonia. 2. Emphysema and chronic bronchial thickening. 3. Aortic Atherosclerosis (ICD10-I70.0) and Emphysema (ICD10-J43.9). Electronically Signed   By: Keith Rake M.D.   On: 04/05/2018 22:17    EKG:   Orders placed or performed during the hospital encounter of 03/19/2018  . ED EKG  . ED EKG  . EKG 12-Lead  . EKG 12-Lead    ASSESSMENT AND PLAN:   63 year old male with past medical history significant for chronic respiratory failure secondary to COPD on 2 L home oxygen, hypertension, diabetes mellitus, history of ischemic bowel status post colostomy, hepatitis C presents from home secondary to worsening shortness of breath.  1.  Sepsis-thought secondary to right lower lobe pneumonia -Sepsis ruled out.  Procalcitonin is less than 0.10 -Received vancomycin and cefepime in the ED. -Patient always has chronic right-sided streaky infiltrate on his previous chest x-rays.  Clinically improving, not considering antibiotics at this time -Follow-up fevers and WBC  2.  COPD exacerbation-also triggered by anxiety -Continue steroids taper depending on clinical improvement dose has been adjusted -Continue cough medications and nebulizer treatments.  3.  Liver cirrhosis-stable.  Continue Xifaxan -On Aldactone  4.  Anxiety-on Xanax  5.  DVT prophylaxis-on subcutaneous heparin  6. Chronic pain- continue methadone  Encourage ambulation   All the records are reviewed and case discussed with Care Management/Social Workerr. Management plans discussed with the patient, family and they are in agreement.  CODE STATUS: Full Code  TOTAL TIME TAKING CARE OF THIS PATIENT: 38 minutes.   POSSIBLE D/C IN 1-2 DAYS, DEPENDING ON CLINICAL CONDITION.   Nicholes Mango M.D on 04/08/2018 at 2:11 PM  Between 7am to 6pm - Pager - [phone]  After 6pm go to [email or web address] - password [email or web address]  [facility]  Office  [phone]  CC: Primary care physician; Dianne Dun, MD

## 2018-04-09 DIAGNOSIS — J441 Chronic obstructive pulmonary disease with (acute) exacerbation: Secondary | ICD-10-CM

## 2018-04-09 DIAGNOSIS — Z515 Encounter for palliative care: Secondary | ICD-10-CM

## 2018-04-09 DIAGNOSIS — A419 Sepsis, unspecified organism: Secondary | ICD-10-CM

## 2018-04-09 DIAGNOSIS — R05 Cough: Secondary | ICD-10-CM

## 2018-04-09 DIAGNOSIS — J189 Pneumonia, unspecified organism: Principal | ICD-10-CM

## 2018-04-09 DIAGNOSIS — Z7189 Other specified counseling: Secondary | ICD-10-CM

## 2018-04-09 LAB — BASIC METABOLIC PANEL
Anion gap: 5 (ref 5–15)
BUN: 27 mg/dL — ABNORMAL HIGH (ref 8–23)
CHLORIDE: 102 mmol/L (ref 98–111)
CO2: 23 mmol/L (ref 22–32)
Calcium: 9 mg/dL (ref 8.9–10.3)
Creatinine, Ser: 0.91 mg/dL (ref 0.61–1.24)
GFR calc Af Amer: >60 mL/min (ref >60)
GFR calc non Af Amer: >60 mL/min (ref >60)
Glucose, Bld: 250 mg/dL — ABNORMAL HIGH (ref 70–99)
Potassium: 5.2 mmol/L — ABNORMAL HIGH (ref 3.5–5.1)
Sodium: 130 mmol/L — ABNORMAL LOW (ref 135–145)

## 2018-04-09 LAB — PROCALCITONIN

## 2018-04-09 LAB — CBC
HCT: 38.2 % — ABNORMAL LOW (ref 39.0–52.0)
HEMOGLOBIN: 12.9 g/dL — AB (ref 13.0–17.0)
MCH: 32.5 pg (ref 26.0–34.0)
MCHC: 33.8 g/dL (ref 30.0–36.0)
MCV: 96.2 fL (ref 80.0–100.0)
Platelets: 100 10*3/uL — ABNORMAL LOW (ref 150–400)
RBC: 3.97 MIL/uL — ABNORMAL LOW (ref 4.22–5.81)
RDW: 14.5 % (ref 11.5–15.5)
WBC: 11.5 10*3/uL — ABNORMAL HIGH (ref 4.0–10.5)
nRBC: 0 % (ref 0.0–0.2)

## 2018-04-09 LAB — GLUCOSE, CAPILLARY
GLUCOSE-CAPILLARY: 216 mg/dL — AB (ref 70–99)
Glucose-Capillary: 202 mg/dL — ABNORMAL HIGH (ref 70–99)
Glucose-Capillary: 256 mg/dL — ABNORMAL HIGH (ref 70–99)
Glucose-Capillary: 327 mg/dL — ABNORMAL HIGH (ref 70–99)

## 2018-04-09 LAB — POTASSIUM: Potassium: 5.1 mmol/L (ref 3.5–5.1)

## 2018-04-09 MED ORDER — SODIUM CHLORIDE 0.9 % IV BOLUS
1000.0000 mL | Freq: Once | INTRAVENOUS | Status: AC
  Administered 2018-04-09: 1000 mL via INTRAVENOUS

## 2018-04-09 MED ORDER — ACETYLCYSTEINE 10 % IN SOLN: 4.0000 mL | Freq: Three times a day (TID) | RESPIRATORY_TRACT | Status: DC

## 2018-04-09 MED ORDER — CARVEDILOL 12.5 MG PO TABS
12.5000 mg | ORAL_TABLET | Freq: Two times a day (BID) | ORAL | Status: DC
  Administered 2018-04-09 – 2018-04-12 (×7): 12.5 mg via ORAL
  Filled 2018-04-09 (×7): qty 1

## 2018-04-09 MED ORDER — SODIUM CHLORIDE 0.9 % IV SOLN
1.0000 g | INTRAVENOUS | Status: DC
  Administered 2018-04-09 – 2018-04-12 (×4): 1 g via INTRAVENOUS
  Filled 2018-04-09 (×4): qty 1

## 2018-04-09 MED ORDER — ACETYLCYSTEINE 20 % IN SOLN
4.0000 mL | Freq: Three times a day (TID) | RESPIRATORY_TRACT | Status: DC
  Administered 2018-04-09: 4 mL via RESPIRATORY_TRACT
  Filled 2018-04-09 (×8): qty 4

## 2018-04-09 MED ORDER — IPRATROPIUM-ALBUTEROL 0.5-2.5 (3) MG/3ML IN SOLN
3.0000 mL | Freq: Three times a day (TID) | RESPIRATORY_TRACT | Status: DC
  Administered 2018-04-09 – 2018-04-12 (×10): 3 mL via RESPIRATORY_TRACT
  Filled 2018-04-09 (×11): qty 3

## 2018-04-09 MED ORDER — SODIUM CHLORIDE 0.9 % IV SOLN
500.0000 mg | INTRAVENOUS | Status: DC
  Administered 2018-04-09: 500 mg via INTRAVENOUS
  Filled 2018-04-09 (×2): qty 500

## 2018-04-09 NOTE — Care Management (Signed)
CM consult ordered for out patient palliative.  Referral made to Santiago Glad with out patient palliative.

## 2018-04-09 NOTE — Progress Notes (Addendum)
On assessment, pt ostomy have a small trace of blood. Notify prime. Will continue to monitor.  2140: Talked to Dr. Marthann Schiller and no new order was place but states to keep an eye on it and notify incoming nurse. Will continue to monitor.  Update 2202: No trace of blood from the ostomy. Notify prime. Will continue to monitor.  Update 0022: Pt was so pushing hard on his stoma while trying to empty the bag. Pt was encouraged to do it slowly so it will not cause trauma to his stoma. Pt got agitated and start yelling at staff. Staff calmly ask the pt to not raise voice, and educated that staff was just concerned of the trauma that it might cause. Will continue to monitor.  Update 0559: Pt BP was at 160/81 HR 69. Talked to Dr. Jerelyn Charles and ordered IV hydralazine 10 mg every 4 hours for SBP > 160. Will continue to monitor.

## 2018-04-09 NOTE — Plan of Care (Signed)
  Problem: Health Behavior/Discharge Planning: Goal: Ability to manage health-related needs will improve Outcome: Progressing   Problem: Clinical Measurements: Goal: Respiratory complications will improve Outcome: Progressing   Problem: Safety: Goal: Ability to remain free from injury will improve Outcome: Progressing   Problem: Clinical Measurements: Goal: Ability to maintain a body temperature in the normal range will improve Outcome: Progressing

## 2018-04-09 NOTE — Progress Notes (Signed)
[place] [facility] NAME: [name]    MR#:  [id number]  DATE OF BIRTH:  Mar 01, 1956  SUBJECTIVE:  CHIEF COMPLAINT:   Chief Complaint  Patient presents with  . Shortness of Breath  . Respiratory Distress   -Complains of cough with greenish phlegm, endorses shortness of breath and wheezing are worse today -Complains of weakness  REVIEW OF SYSTEMS:  Review of Systems  Constitutional: Positive for malaise/fatigue. Negative for chills and fever.  HENT: Negative for ear discharge, hearing loss and nosebleeds.   Eyes: Negative for blurred vision and double vision.  Respiratory: Positive for cough and shortness of breath. Negative for wheezing.   Cardiovascular: Negative for chest pain and palpitations.  Gastrointestinal: Positive for abdominal pain and nausea. Negative for constipation, diarrhea and vomiting.  Genitourinary: Negative for dysuria.  Musculoskeletal: Negative for myalgias.  Neurological: Negative for dizziness, focal weakness, seizures, weakness and headaches.  Psychiatric/Behavioral: Negative for depression.    DRUG ALLERGIES:  No Known Allergies  VITALS:  Blood pressure (!) 175/97, pulse 79, temperature 97.6 F (36.4 C), temperature source Oral, resp. rate 19, height 5\' 10"  (1.778 m), weight 62.3 kg, SpO2 96 %.  PHYSICAL EXAMINATION:  Physical Exam  GENERAL:  63 y.o.-year-old patient lying in the bed with no acute distress.  EYES: Pupils equal, round, reactive to light and accommodation. No scleral icterus. Extraocular muscles intact.  HEENT: Head atraumatic, normocephalic. Oropharynx and nasopharynx clear.  NECK:  Supple, no jugular venous distention. No thyroid enlargement, no tenderness.  LUNGS: Normal breath sounds bilaterally, scattered expiratory wheezing all over the lung fields, no rales,rhonchi or crepitation. No use of accessory muscles of respiration.  CARDIOVASCULAR: S1, S2 normal. No  rubs, or  gallops. 2/6 systolic murmur present ABDOMEN: Soft, nontender, nondistended. Bowel sounds present. No organomegaly or mass.  Colostomy bag in place and right lower quadrant. EXTREMITIES: No pedal edema, cyanosis, or clubbing.  NEUROLOGIC: Cranial nerves II through XII are intact. Muscle strength 5/5 in all extremities. Sensation intact. Gait not checked.  PSYCHIATRIC: The patient is alert and oriented x 3.  SKIN: No obvious rash, lesion, or ulcer.    LABORATORY PANEL:   CBC Recent Labs  Lab 04/09/18 0431  WBC 11.5*  HGB 12.9*  HCT 38.2*  PLT 100*   ------------------------------------------------------------------------------------------------------------------  Chemistries  Recent Labs  Lab 04/04/18 1712  04/09/18 0431  NA 132*   < > 130*  K 5.1   < > 5.2*  CL 108   < > 102  CO2 16*   < > 23  GLUCOSE 250*   < > 250*  BUN 32*   < > 27*  CREATININE 0.83   < > 0.91  CALCIUM 9.1   < > 9.0  AST 41  --   --   ALT 47*  --   --   ALKPHOS 206*  --   --   BILITOT 1.0  --   --    < > = values in this interval not displayed.   ------------------------------------------------------------------------------------------------------------------  Cardiac Enzymes Recent Labs  Lab 03/18/2018 2149  TROPONINI <0.03   ------------------------------------------------------------------------------------------------------------------  RADIOLOGY:  No results found.  EKG:   Orders placed or performed during the hospital encounter of 04/04/2018  . ED EKG  . ED EKG  . EKG 12-Lead  . EKG 12-Lead    ASSESSMENT AND PLAN:   63 year old male with past medical history significant for chronic respiratory failure secondary to COPD on  2 L home oxygen, hypertension, diabetes mellitus, history of ischemic bowel status post colostomy, hepatitis C presents from home secondary to worsening shortness of breath.  1.  Sepsis-thought secondary to right lower lobe pneumonia -Sepsis ruled out.   Procalcitonin is less than 0.10 -Received vancomycin and cefepime in the ED. -Patient always has chronic right-sided streaky infiltrate on his previous chest x-rays.  Initially antibiotics were not considered but pulmonology is recommending IV Rocephin and azithromycin -Follow-up fevers and WBC  2.  COPD exacerbation-also triggered by anxiety -Continue steroids taper depending on clinical improvement dose has been adjusted -Continue cough medications and nebulizer treatments. -Pulmonology consult placed seen by Dr. Ashby Dawes, started on antibiotics IV Rocephin and azithromycin  3.  Liver cirrhosis-stable.  Continue Xifaxan -On Aldactone  4.  Anxiety-on Xanax  5.  Hyperkalemia try IV fluid bolus and repeat potassium if no improvement will try [place] check a.m. labs  6. Chronic pain- continue methadone  7.  Failure to thrive-palliative care consult placed  DVT prophylaxis with the heparin subcu  Encourage ambulation   All the records are reviewed and case discussed with Care Management/Social Workerr. Management plans discussed with the patient, sister over phone and they are in agreement.  CODE STATUS: Full Code  TOTAL TIME TAKING CARE OF THIS PATIENT: 38 minutes.   POSSIBLE D/C IN 1-2 DAYS, DEPENDING ON CLINICAL CONDITION.   Nicholes Mango M.D on 04/09/2018 at 3:00 PM  Between 7am to 6pm - Pager - [phone]  After 6pm go to [email or web address] - password [email or web address] [facility]  Office  [phone]  CC: Primary care physician; Dianne Dun, MD

## 2018-04-09 NOTE — Care Management Note (Addendum)
Case Management Note  Patient Details  Name: [name] MRN: [id number] Date of Birth: [birth year]  Subjective/Objective:    Patient is from home with mother.  His sister checks on him and his mother.  Current with PCP.  Obtains medications from [employer].  Denies difficulties obtaining medications or accessing healthcare.  He has Medicaid.  Palliative consulting.  Offered [place] services at [place].  List of agencies and rating provided.  Patient has used [facility] in the past and would like to use them.  Referral made to [facility] for RN, PT, SW, aide.  Has an ostomy and obtains supplies through [facility] as well.  Will notify [facility] when patient discharges.                 Action/Plan:   Expected Discharge Date:                  Expected Discharge Plan:  [place]  In-House Referral:     Discharge planning Services  CM Consult  Post Acute Care Choice:  Home Health Choice offered to:  Patient  DME Arranged:    DME Agency:     HH Arranged:  RN, PT, OT, Nurse's Aide, Social Work [employer] Agency:  [place]  Status of Service:  Completed, signed off  If discussed at [employer], dates discussed:    Additional Comments:  Elza Rafter, RN 04/09/2018, 12:09 PM

## 2018-04-09 NOTE — Consult Note (Signed)
Consultation Note Date: 04/09/2018   Patient Name: [name]  DOB: 12-26-1955  MRN: [id number]  Age / Sex: 63 y.o., male  PCP: Tommy Dun, MD Referring Physician: Nicholes Mango, MD  Reason for Consultation: Establishing goals of care  HPI/Patient Profile: 63 y.o. male  admitted on 03/14/2018 from home with complaints of shortness of breath and cough. He has a past medical history significant for respiratory failure, COPD (home oxygen 2L/[place]), asthma, anxiety, chronic back pain (methadone), depression, diabetes, BPH, hepatitis C, alcoholic cirrhosis, former tobacco use (quite 2018), and hypertension. Patient recently discharged from hospital after having some bleeding from his ostomy site. He presented to ED after worsening shortness of breath and congested cough with thick sputum. NA 132, Glucose 303, troponin 0.03, lactic acid 1.6, WBC 9.8, Chest x-ray showed vague slightly streaky opacities in the right midlung zone, questionable atelectasis or Cameron pneumonia. Emphysema and chronic bronchial thickening. Since admission he has ruled out for sepsis. He was seen by Pulmonology and recommended to start on Rocephin and azithromycin. He continues on home medication and steroids for COPD exacerbation. He has chronic pain which is manage on methadone. Palliative Medicine team consulted for goals of care.   Clinical Assessment and Goals of Care: I have reviewed medical records including lab results, imaging, Epic notes, and MAR, received report from the bedside RN, and assessed the patient. The patient and I spent time discussing his diagnosis prognosis, GOC, EOL wishes, disposition and options. He is A&O x3. Denies pain or shortness of breath at this time. I offered to involve other family, however, patient declined and stated he could update his sister and mother.   I introduced Palliative Medicine as specialized  medical care for people living with serious illness. It focuses on providing relief from the symptoms and stress of a serious illness. The goal is to improve quality of life for both the patient and the family.  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 He states he fell off a high up scapel and after breaking several bones he was forced to quit. He reports he has no children and not married. He currently lives with his mother. He states his mother, sister, and his niece are his main support system.   As far as functional and nutritional status prior to admission he reports he was able to provide care independently. He did not require any assistive devices with ambulating. He states he did require some assistance when he was feeling bad because he would be tired, fatigued, and short of breath. He reports his appetite was up and down depending on how he was feeling and how his ileostomy would be doing. He states he has nausea at times. He states he quit smoking in 2018. Continues to drive. Denies home oxygen use.   We discussed his current illness and what it means in the larger context of his on-going co-morbidities. Natural disease trajectory and expectations at EOL were discussed. [name] expressed his understanding of his condition.  He states he knows his breathing is getting worst. He is concerned he may have to use oxygen at home which he doesn't want, but will use if required.   I attempted to elicit values and goals of care important to the patient.    The difference between aggressive medical intervention and comfort care was considered in light of the patient's goals of care. Patient express he remains hopeful for improvement and that he can go home with home health PT and get better. He verbalized "I am not ready to check out of here just yet, I have things to do and people to see!"   [name] states he has an advance directive which indicates his sister,  [name] is his Scientist, research (medical). I discuss in details his current full code status with consideration to his current illness and co-morbidities. [name] expresses he wants everything done including CPR and life-support if needed, unless he is in a vegetative state, then which his sister can and will make the decision to withdraw care at that time. We discussed risk of weaning from ventilator given his respiratory condition. [name] expresses at this time he has to fight until the very last minute and will endure whatever care needs to happen to at least have that fighting chance. Support of his wishes provided.   Hospice and Palliative Care services outpatient were explained and offered. Given [name] request full aggressive measures recommendations for outpatient palliative services were discussed.  He verbalized understanding and awareness of palliative's  goals and philosophy of care. He is requesting outpatient palliative support.   Questions and concerns were addressed.  Hard Choices booklet left for review. Patient was encouraged to call with questions or concerns.  PMT will continue to support holistically.  Primary Decision Maker: PATIENT or Sister [name])    SUMMARY OF RECOMMENDATIONS    Full Code   Continue to treat treatable, including aggressive measures   Patient and family remains hopeful for some improvement. Request outpatient home health and palliative services.   Case Management referral for outpatient palliative.   PMT will continue to support and follow.   Code Status/Advance Care Planning:  Full code   Symptom Management:   Per attending   Palliative Prophylaxis:   Bowel Regimen, Delirium Protocol, Frequent Pain Assessment and Turn Reposition  Additional Recommendations (Limitations, Scope, Preferences):  Full Scope Treatment  Psycho-social/Spiritual:   Desire for further Chaplaincy support:NO  Prognosis:   Guarded to  Poor- in the setting of sepsis, COPD, GERD, hypertension, pneumonia, poor po intake, alcoholic cirrhosis, anxiety, chronic pain, deconditioning, ileostomy, and hyperkalemia.   Discharge Planning: Home with Home Health and outpatient palliative.       Primary Diagnoses: Present on Admission: . Sepsis ([place]) . COPD with acute exacerbation ([place]) . GERD (gastroesophageal reflux disease) . HCAP (healthcare-associated pneumonia) . HTN (hypertension)   I have reviewed the medical record, interviewed the patient and family, and examined the patient. The following aspects are pertinent.  Past Medical History:  Diagnosis Date  . Acute respiratory failure ([place])   . Anxiety unk  . Anxiety   . Arthritis   . Asthma   . BPH (benign prostatic hyperplasia)   . Chronic back pain unk  . Cirrhosis ([place])   . COPD (chronic obstructive pulmonary disease) ([place])    now on 2L home o2  . COPD (chronic obstructive pulmonary disease) ([place])   . Depression   . Diabetes mellitus without complication ([place])   . Dyspnea  DOE  . Encephalopathy   . Encephalopathy   . GERD (gastroesophageal reflux disease)   . Hep C w/o coma, chronic ([place])   . Hepatitis C   . Hepatitis C, chronic ([place])   . Hepatitis C, chronic ([name])    TO START MEDICATION AFTER ENDOSCOPY  . History of hiatal hernia   . HTN (hypertension)   . Hypertension    NO MEDS NOW  . Screening for malignant neoplasm of colon    Social History   Socioeconomic History  . Marital status: Single    Spouse name: Not on file  . Number of children: Not on file  . Years of education: Not on file  . Highest education level: Not on file  Occupational History  . Not on file  Social Needs  . Financial resource strain: Not very hard  . Food insecurity:    Worry: Patient refused    Inability: Patient refused  . Transportation needs:    Medical: Patient refused    Non-medical: Patient refused  Tobacco Use  . Smoking status: Former Smoker     Packs/day: 1.00    Years: 30.00    Pack years: 30.00    Types: Cigarettes    Last attempt to quit: 05/26/2016    Years since quitting: 1.8  . Smokeless tobacco: Never Used  . Tobacco comment: quit recently  Substance and Sexual Activity  . Alcohol use: Not Currently  . Drug use: Not Currently    Types: Cocaine  . Sexual activity: Not Currently  Lifestyle  . Physical activity:    Days per week: Patient refused    Minutes per session: Patient refused  . Stress: Only a little  Relationships  . Social connections:    Talks on phone: Patient refused    Gets together: Patient refused    Attends religious service: Patient refused    Active member of club or organization: Patient refused    Attends meetings of clubs or organizations: Patient refused    Relationship status: Patient refused  Other Topics Concern  . Not on file  Social History Narrative   ** Merged History Encounter **       Lives at home with his mother. Ambulates with the help of a walker/cane   Family History  Problem Relation Age of Onset  . Prostate cancer Father   . Kidney disease Father   . Cancer Father   . Dementia Father   . Bladder Cancer Neg Hx    Scheduled Meds: . acetylcysteine  4 mL Nebulization TID  . alfuzosin  10 mg Oral Q breakfast  . ALPRAZolam  1 mg Oral TID  . arformoterol  15 mcg Nebulization BID  . budesonide (PULMICORT) nebulizer solution  0.5 mg Nebulization BID  . carvedilol  12.5 mg Oral BID WC  . chlorpheniramine-HYDROcodone  5 mL Oral Q12H  . fluticasone  2 spray Each Nare Daily  . furosemide  20 mg Oral Daily  . heparin injection (subcutaneous)  5,000 Units Subcutaneous Q8H  . insulin aspart  0-15 Units Subcutaneous TID WC  . insulin aspart  0-5 Units Subcutaneous QHS  . insulin aspart  4 Units Subcutaneous TID WC  . insulin glargine  12 Units Subcutaneous QHS  . ipratropium-albuterol  3 mL Nebulization TID  . methadone  20 mg Oral Q6H  . methylPREDNISolone (SOLU-MEDROL)  injection  40 mg Intravenous Q12H  . rifaximin  550 mg Oral BID  . vitamin C  250 mg Oral Daily  Continuous Infusions: . azithromycin    . cefTRIAXone (ROCEPHIN)  IV    . sodium chloride 1,000 mL (04/09/18 1431)   PRN Meds:.acetaminophen **OR** acetaminophen, benzonatate, guaiFENesin-dextromethorphan, ipratropium-albuterol, ondansetron **OR** ondansetron (ZOFRAN) IV Medications Prior to Admission:  Prior to Admission medications   Medication Sig Start Date End Date Taking? Authorizing Provider  albuterol (PROVENTIL HFA;VENTOLIN HFA) 108 (90 Base) MCG/ACT inhaler Inhale 2-4 puffs by mouth every 4 hours as needed for wheezing, cough, and/or shortness of breath 07/02/17  Yes Hinda Kehr, MD  albuterol (PROVENTIL) (2.5 MG/3ML) 0.083% nebulizer solution Take 2.5 mg by nebulization every 6 (six) hours as needed for wheezing or shortness of breath.   Yes [provider]  alfuzosin (UROXATRAL) 10 MG 24 hr tablet Take 10 mg by mouth 2 (two) times daily.    Yes [provider]  ALPRAZolam Duanne Moron) 1 MG tablet Take 1 mg by mouth 3 (three) times daily.    Yes [provider]  azithromycin (ZITHROMAX) 250 MG tablet One tab po daily for two more days 03/17/18  Yes Wieting, Richard, MD  budesonide-formoterol [facility]) 160-4.5 MCG/ACT inhaler Inhale 2 puffs into the lungs 2 (two) times daily. 09/05/16  Yes Mody, Ulice Bold, MD  carvedilol (COREG) 6.25 MG tablet Take 6.25 mg by mouth 2 (two) times daily with a meal.   Yes [provider]  fluticasone (FLONASE) 50 MCG/ACT nasal spray Place 2 sprays into both nostrils daily.   Yes [provider]  furosemide (LASIX) 20 MG tablet Take 1 tablet (20 mg total) by mouth daily. 07/27/17  Yes Epifanio Lesches, MD  ipratropium-albuterol (DUONEB) 0.5-2.5 (3) MG/3ML SOLN Take 3 mLs by nebulization every 6 (six) hours as needed (wheezing, shortness of breath). 07/15/17  Yes Gladstone Lighter, MD  methadone (DOLOPHINE) 10 MG tablet  Take 20 mg by mouth 4 (four) times daily.    Yes [provider]  predniSONE (DELTASONE) 10 MG tablet Take 5-50 mg by mouth daily with breakfast. 50mg  x3days, then 40mg  x3days, then 30mg  x3days, then 20mg  x3day, then 10mg  x3days, then 5mg  x4days 04/01/18  Yes [provider]  rifaximin (XIFAXAN) 550 MG TABS tablet Take 1 tablet (550 mg total) by mouth 2 (two) times daily. 08/12/14  Yes Gladstone Lighter, MD  spironolactone (ALDACTONE) 25 MG tablet Take 2 tablets (50 mg total) by mouth daily. 03/16/18  Yes Wieting, Richard, MD  tiotropium (SPIRIVA) 18 MCG inhalation capsule Place 18 mcg into inhaler and inhale daily.   Yes [provider]  vitamin C (VITAMIN C) 250 MG tablet Take 1 tablet (250 mg total) by mouth 2 (two) times daily. 03/16/18  Yes Wieting, Richard, MD   No Known Allergies Review of Systems  Constitutional: Positive for activity change and fatigue.  Respiratory: Positive for cough and shortness of breath.   Gastrointestinal: Positive for abdominal pain.  Neurological: Positive for weakness.  All other systems reviewed and are negative.   Physical Exam Vitals signs and nursing note reviewed.  Constitutional:      General: He is awake.     Appearance: He is ill-appearing.     Comments: Chronically ill appearing   Cardiovascular:     Rate and Rhythm: Normal rate and regular rhythm.     Pulses: Normal pulses.     Heart sounds: Normal heart sounds.  Pulmonary:     Effort: Pulmonary effort is normal.     Breath sounds: Decreased breath sounds present.     Comments: Congested cough Abdominal:  General: Bowel sounds are normal.     Palpations: Abdomen is soft.     Tenderness: There is abdominal tenderness.     Comments: Ileostomy   Musculoskeletal:     Comments: Generalized weakness   Skin:    General: Skin is warm and dry.  Neurological:     Mental Status: He is alert and oriented to person, place, and time.  Psychiatric:        Attention and  Perception: Attention normal.        Mood and Affect: Mood normal.        Speech: Speech normal.        Behavior: Behavior is cooperative.        Thought Content: Thought content normal.        Cognition and Memory: Cognition normal.        Judgment: Judgment normal.     Vital Signs: BP (!) 175/97 (BP Location: Left Arm)   Pulse 79   Temp 97.6 F (36.4 C) (Oral)   Resp 19   Ht 5\' 10"  (1.778 m)   Wt 62.3 kg   SpO2 96%   BMI 19.70 kg/m  Pain Scale: 0-10   Pain Score: 9    SpO2: SpO2: 96 % O2 Device:SpO2: 96 % O2 Flow Rate: .   IO: Intake/output summary:   Intake/Output Summary (Last 24 hours) at 04/09/2018 1517 Last data filed at 04/09/2018 1409 Gross per 24 hour  Intake -  Output 4170 ml  Net -4170 ml    LBM: Last BM Date: 04/09/18 Baseline Weight: Weight: 59 kg Most recent weight: Weight: 62.3 kg     Palliative Assessment/Data: PPS 50%     Time In: 1230 Time Out: 1345 Time Total: 75 min  Greater than 50%  of this time was spent counseling and coordinating care related to the above assessment and plan.  Signed by:  Alda Lea, AGPCNP-BC Palliative Medicine Team  Phone: [phone] Fax: [phone] Pager: [phone] Amion: Bjorn Pippin    Please contact Palliative Medicine Team phone at [phone] for questions and concerns.  For individual provider: See Shea Evans

## 2018-04-09 NOTE — Progress Notes (Signed)
New referral for outpatient Palliative to follow at home received from [facility] Jennifer Miller. Patient information faxed to referral.  °Karen Robertson RN, BSN, CHPN °[facility] [place], hospital Liaison °[phone] °
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
## 2018-04-10 ENCOUNTER — Inpatient Hospital Stay: Payer: Medicaid Other

## 2018-04-10 LAB — GLUCOSE, CAPILLARY
Glucose-Capillary: 293 mg/dL — ABNORMAL HIGH (ref 70–99)
Glucose-Capillary: 385 mg/dL — ABNORMAL HIGH (ref 70–99)
Glucose-Capillary: 374 mg/dL — ABNORMAL HIGH (ref 70–99)
Glucose-Capillary: 280 mg/dL — ABNORMAL HIGH (ref 70–99)

## 2018-04-10 LAB — BASIC METABOLIC PANEL
Anion gap: 3 — ABNORMAL LOW (ref 5–15)
BUN: 35 mg/dL — ABNORMAL HIGH (ref 8–23)
CO2: 23 mmol/L (ref 22–32)
Calcium: 9 mg/dL (ref 8.9–10.3)
Chloride: 104 mmol/L (ref 98–111)
Creatinine, Ser: 0.94 mg/dL (ref 0.61–1.24)
Glucose, Bld: 327 mg/dL — ABNORMAL HIGH (ref 70–99)
Potassium: 5.4 mmol/L — ABNORMAL HIGH (ref 3.5–5.1)
Sodium: 130 mmol/L — ABNORMAL LOW (ref 135–145)

## 2018-04-10 LAB — CBC
HCT: 39.6 % (ref 39.0–52.0)
Hemoglobin: 13.3 g/dL (ref 13.0–17.0)
MCH: 33 pg (ref 26.0–34.0)
MCHC: 33.6 g/dL (ref 30.0–36.0)
MCV: 98.3 fL (ref 80.0–100.0)
NRBC: 0 % (ref 0.0–0.2)
Platelets: 89 10*3/uL — ABNORMAL LOW (ref 150–400)
RBC: 4.03 MIL/uL — AB (ref 4.22–5.81)
RDW: 14.6 % (ref 11.5–15.5)
WBC: 9.7 10*3/uL (ref 4.0–10.5)

## 2018-04-10 MED ORDER — AZITHROMYCIN 250 MG PO TABS
250.0000 mg | ORAL_TABLET | Freq: Every day | ORAL | Status: DC
  Administered 2018-04-10 – 2018-04-12 (×3): 250 mg via ORAL
  Filled 2018-04-10 (×3): qty 1

## 2018-04-10 MED ORDER — TRAMADOL HCL 50 MG PO TABS
50.0000 mg | ORAL_TABLET | Freq: Four times a day (QID) | ORAL | Status: DC | PRN
  Administered 2018-04-10 – 2018-04-11 (×2): 50 mg via ORAL
  Filled 2018-04-10 (×2): qty 1

## 2018-04-10 MED ORDER — HYDRALAZINE HCL 20 MG/ML IJ SOLN
10.0000 mg | INTRAMUSCULAR | Status: DC | PRN
  Administered 2018-04-10: 10 mg via INTRAVENOUS
  Filled 2018-04-10: qty 1

## 2018-04-10 MED ORDER — INSULIN GLARGINE 100 UNIT/ML ~~LOC~~ SOLN
16.0000 [IU] | Freq: Every day | SUBCUTANEOUS | Status: DC
  Administered 2018-04-10: 16 [IU] via SUBCUTANEOUS
  Filled 2018-04-10 (×2): qty 0.16

## 2018-04-10 MED ORDER — PATIROMER SORBITEX CALCIUM 8.4 G PO PACK
8.4000 g | PACK | Freq: Every day | ORAL | Status: DC
  Administered 2018-04-10 – 2018-04-11 (×2): 8.4 g via ORAL
  Filled 2018-04-10 (×3): qty 1

## 2018-04-10 MED ORDER — INSULIN ASPART 100 UNIT/ML ~~LOC~~ SOLN
8.0000 [IU] | Freq: Three times a day (TID) | SUBCUTANEOUS | Status: DC
  Administered 2018-04-10 – 2018-04-12 (×7): 8 [IU] via SUBCUTANEOUS
  Filled 2018-04-10 (×7): qty 1

## 2018-04-10 NOTE — Progress Notes (Signed)
Inpatient Diabetes Program Recommendations  AACE/ADA: New Consensus Statement on Inpatient Glycemic Control   Target Ranges:  Prepandial:   less than 140 mg/dL      Peak postprandial:   less than 180 mg/dL (1-2 hours)      Critically ill patients:  140 - 180 mg/dL   Results for [name], [name] (MRN [id number]) as of 04/10/2018 12:14  Ref. Range 04/09/2018 07:45 04/09/2018 11:37 04/09/2018 16:27 04/09/2018 21:09 04/10/2018 07:43 04/10/2018 11:43  Glucose-Capillary Latest Ref Range: 70 - 99 mg/dL 202 (H) 216 (H) 256 (H) 327 (H) 293 (H) 385 (H)  Results for [name], [name] (MRN [id number]) as of 04/10/2018 12:14  Ref. Range 07/14/2017 05:42  Hemoglobin A1C Latest Ref Range: 4.8 - 5.6 % 6.7 (H)   Review of Glycemic Control  Diabetes history: DM2 Outpatient Diabetes medications: None Current orders for Inpatient glycemic control: Lantus 12 units QHS, Novolog 0-15 units TID with meals, Novolog 0-5 units QHS, Novolog 4 units TID with meals; Solumedrol 40 mg Q12H  Inpatient Diabetes Program Recommendations:   Insulin - Basal: If steroids are continued as ordered, please consider increasing Lantus to 16 units QHS.  Insulin - Meal Coverage: If steroids are continued as ordered, please consider increasing meal coverage to Novolog 8 units TID with meals if patient eats at least 50% of meals.  HbgA1C: Please consider ordering an A1C to evaluate glycemic control over the past 2-3 months.  Thanks Barnie Alderman, RN, MSN, CDE Diabetes Coordinator Inpatient Diabetes Program [phone] (Team Pager from 8am to 5pm)

## 2018-04-10 NOTE — Progress Notes (Signed)
[facility] [place] NAME: [name]    MR#:  [id number]  DATE OF BIRTH:  1956/02/27  SUBJECTIVE:  CHIEF COMPLAINT:   Chief Complaint  Patient presents with  . Shortness of Breath  . Respiratory Distress   -Complains of neck pain, requesting pain meds , cough with greenish phlegm, endorses shortness of breath and wheezing are worse today -Complains of weakness  REVIEW OF SYSTEMS:  Review of Systems  Constitutional: Positive for malaise/fatigue. Negative for chills and fever.  HENT: Negative for ear discharge, hearing loss and nosebleeds.   Eyes: Negative for blurred vision and double vision.  Respiratory: Positive for cough and shortness of breath. Negative for wheezing.   Cardiovascular: Negative for chest pain and palpitations.  Gastrointestinal: Positive for abdominal pain and nausea. Negative for constipation, diarrhea and vomiting.  Genitourinary: Negative for dysuria.  Musculoskeletal: Negative for myalgias.  Neurological: Negative for dizziness, focal weakness, seizures, weakness and headaches.  Psychiatric/Behavioral: Negative for depression.    DRUG ALLERGIES:  No Known Allergies  VITALS:  Blood pressure (!) 143/64, pulse 91, temperature 97.6 F (36.4 C), temperature source Oral, resp. rate 18, height 5\' 10"  (1.778 m), weight 62.3 kg, SpO2 90 %.  PHYSICAL EXAMINATION:  Physical Exam  GENERAL:  63 y.o.-year-old patient lying in the bed with no acute distress.  EYES: Pupils equal, round, reactive to light and accommodation. No scleral icterus. Extraocular muscles intact.  HEENT: Head atraumatic, normocephalic. Oropharynx and nasopharynx clear.  NECK:  Supple, no jugular venous distention. No thyroid enlargement, no tenderness.  LUNGS: Normal breath sounds bilaterally, scattered expiratory wheezing all over the lung fields, no rales,rhonchi or crepitation. No use of accessory muscles of respiration.    CARDIOVASCULAR: S1, S2 normal. No  rubs, or gallops. 2/6 systolic murmur present ABDOMEN: Soft, nontender, nondistended. Bowel sounds present.  Colostomy bag in place and right lower quadrant with brown stool EXTREMITIES: No pedal edema, cyanosis, or clubbing.  NEUROLOGIC: awake , alert , oriented times 3  .Sensation intact. Gait not checked.  PSYCHIATRIC: The patient is alert and oriented x 3.  SKIN: No obvious rash, lesion, or ulcer.    LABORATORY PANEL:   CBC Recent Labs  Lab 04/10/18 0521  WBC 9.7  HGB 13.3  HCT 39.6  PLT 89*   ------------------------------------------------------------------------------------------------------------------  Chemistries  Recent Labs  Lab 04/04/18 1712  04/10/18 0521  NA 132*   < > 130*  K 5.1   < > 5.4*  CL 108   < > 104  CO2 16*   < > 23  GLUCOSE 250*   < > 327*  BUN 32*   < > 35*  CREATININE 0.83   < > 0.94  CALCIUM 9.1   < > 9.0  AST 41  --   --   ALT 47*  --   --   ALKPHOS 206*  --   --   BILITOT 1.0  --   --    < > = values in this interval not displayed.   ------------------------------------------------------------------------------------------------------------------  Cardiac Enzymes Recent Labs  Lab 03/18/2018 2149  TROPONINI <0.03   ------------------------------------------------------------------------------------------------------------------  RADIOLOGY:  No results found.  EKG:   Orders placed or performed during the hospital encounter of 03/19/2018  . ED EKG  . ED EKG  . EKG 12-Lead  . EKG 12-Lead    ASSESSMENT AND PLAN:   63 year old male with past medical history significant for chronic respiratory failure secondary to COPD  on 2 L home oxygen, hypertension, diabetes mellitus, history of ischemic bowel status post colostomy, hepatitis C presents from home secondary to worsening shortness of breath.  1.  Sepsis-acute bronchitis and acute pneumonia -Sepsis ruled out.  Procalcitonin is less than  0.10 -Received vancomycin and cefepime in the ED. -Patient always has chronic right-sided streaky infiltrate on his previous chest x-rays.  Initially antibiotics were not considered but pulmonology is recommending IV Rocephin and azithromycin for bronchitis and pneumonia -Follow-up fevers and WBC  2.  COPD exacerbation-with past history of severe COPD -Continue steroids taper depending on clinical improvement dose has been adjusted -Continue cough medications and nebulizer treatments. -Pulmonology consult placed seen by Dr. Ashby Dawes, started on antibiotics IV Rocephin and azithromycin  3.  Liver cirrhosis-stable.  Continue Xifaxan -On Aldactone  4.  Acute neck pain-cervical x-ray, pain management as needed  5.  Hyperkalemia s/p  IV fluid bolus and repeat potassium at 5.4 patient is started on [place] i check a.m. labs  6. Chronic pain- continue methadone  7.  Failure to thrive-palliative care consult placed-recommending outpatient palliative care  8.  Anxiety anxiolytics as needed  DVT prophylaxis with the heparin subcu  Encourage ambulation   All the records are reviewed and case discussed with Care Management/Social Workerr. Management plans discussed with the patient, sister conny-[phone] over phone and they are in agreement.  CODE STATUS: Full Code  TOTAL TIME TAKING CARE OF THIS PATIENT: 38 minutes.   POSSIBLE D/C IN 1-2 DAYS, DEPENDING ON CLINICAL CONDITION.   Nicholes Mango M.D on 04/10/2018 at 3:14 PM  Between 7am to 6pm - Pager - [phone]  After 6pm go to [email or web address] - password [email or web address] [facility]  Office  [phone]  CC: Primary care physician; Dianne Dun, MD

## 2018-04-11 LAB — BASIC METABOLIC PANEL
Anion gap: 3 — ABNORMAL LOW (ref 5–15)
BUN: 30 mg/dL — ABNORMAL HIGH (ref 8–23)
CO2: 25 mmol/L (ref 22–32)
Calcium: 8.9 mg/dL (ref 8.9–10.3)
Chloride: 103 mmol/L (ref 98–111)
Creatinine, Ser: 0.82 mg/dL (ref 0.61–1.24)
GFR calc Af Amer: >60 mL/min (ref >60)
GFR calc non Af Amer: >60 mL/min (ref >60)
Glucose, Bld: 238 mg/dL — ABNORMAL HIGH (ref 70–99)
Potassium: 5.3 mmol/L — ABNORMAL HIGH (ref 3.5–5.1)
Sodium: 131 mmol/L — ABNORMAL LOW (ref 135–145)

## 2018-04-11 LAB — GLUCOSE, CAPILLARY
Glucose-Capillary: 217 mg/dL — ABNORMAL HIGH (ref 70–99)
Glucose-Capillary: 293 mg/dL — ABNORMAL HIGH (ref 70–99)
Glucose-Capillary: 195 mg/dL — ABNORMAL HIGH (ref 70–99)
Glucose-Capillary: 237 mg/dL — ABNORMAL HIGH (ref 70–99)

## 2018-04-11 LAB — CBC
HCT: 39.6 % (ref 39.0–52.0)
Hemoglobin: 13.2 g/dL (ref 13.0–17.0)
MCH: 32.3 pg (ref 26.0–34.0)
MCHC: 33.3 g/dL (ref 30.0–36.0)
MCV: 96.8 fL (ref 80.0–100.0)
Platelets: 76 10*3/uL — ABNORMAL LOW (ref 150–400)
RBC: 4.09 MIL/uL — ABNORMAL LOW (ref 4.22–5.81)
RDW: 14.6 % (ref 11.5–15.5)
WBC: 8.7 10*3/uL (ref 4.0–10.5)
nRBC: 0 % (ref 0.0–0.2)

## 2018-04-11 MED ORDER — INSULIN GLARGINE 100 UNIT/ML ~~LOC~~ SOLN
20.0000 [IU] | Freq: Every day | SUBCUTANEOUS | Status: DC
  Administered 2018-04-11: 20 [IU] via SUBCUTANEOUS
  Filled 2018-04-11 (×2): qty 0.2

## 2018-04-11 MED ORDER — FLUCONAZOLE 100 MG PO TABS
100.0000 mg | ORAL_TABLET | Freq: Every day | ORAL | Status: DC
  Administered 2018-04-11 – 2018-04-12 (×2): 100 mg via ORAL
  Filled 2018-04-11 (×2): qty 1

## 2018-04-11 MED ORDER — SODIUM CHLORIDE 0.9% FLUSH
3.0000 mL | Freq: Two times a day (BID) | INTRAVENOUS | Status: DC
  Administered 2018-04-11: 10 mL via INTRAVENOUS

## 2018-04-11 MED ORDER — SODIUM CHLORIDE 0.9% FLUSH: 3.0000 mL | INTRAVENOUS | Status: DC | PRN

## 2018-04-11 MED ORDER — ALUM & MAG HYDROXIDE-SIMETH 200-200-20 MG/5ML PO SUSP
30.0000 mL | Freq: Four times a day (QID) | ORAL | Status: DC | PRN
  Administered 2018-04-11: 30 mL via ORAL
  Filled 2018-04-11 (×2): qty 30

## 2018-04-11 MED ORDER — PANTOPRAZOLE SODIUM 40 MG IV SOLR
40.0000 mg | Freq: Two times a day (BID) | INTRAVENOUS | Status: DC
  Administered 2018-04-12 (×2): 40 mg via INTRAVENOUS
  Filled 2018-04-11 (×2): qty 40

## 2018-04-11 MED ORDER — SODIUM CHLORIDE 0.9 % IV SOLN
INTRAVENOUS | Status: DC | PRN
  Administered 2018-04-11: 500 mL via INTRAVENOUS

## 2018-04-11 MED ORDER — SODIUM CHLORIDE 0.9% FLUSH: 10.0000 mL | INTRAVENOUS | Status: DC | PRN

## 2018-04-11 MED ORDER — SODIUM CHLORIDE 0.9% FLUSH
10.0000 mL | Freq: Two times a day (BID) | INTRAVENOUS | Status: DC
  Administered 2018-04-11 – 2018-04-12 (×2): 10 mL via INTRAVENOUS

## 2018-04-11 NOTE — Progress Notes (Signed)
Nursing reports new, sudden onset of 10/10 diffuse abdominal pain associated with new frank blood in colostomy bag and golfball sized mass palpable at mid-epigastric area.  Stat noncontrast CT abdomen/pelvis and CBC ordered.  IV Protonix ordered.

## 2018-04-11 NOTE — Progress Notes (Addendum)
Pt states " I did not get my 0000 methadone to staff. Staff made the pt aware that he took his methadone. Pt argued that he did not received it. Charge nurse made aware and talked to the pt. Pt talked to the charge nurse and states he wants to take his xanax. Notify prime. Will continue to monitor.  Update 0518: Dr. Posey Pronto made aware of the situation and states to give the 1000 xanax now. Will continue to monitor.

## 2018-04-11 NOTE — Plan of Care (Signed)
  Problem: Health Behavior/Discharge Planning: Goal: Ability to manage health-related needs will improve Outcome: Progressing   Problem: Pain Managment: Goal: General experience of comfort will improve Outcome: Progressing   Problem: Safety: Goal: Ability to remain free from injury will improve Outcome: Progressing   

## 2018-04-11 NOTE — Progress Notes (Signed)
PT Cancellation Note  Patient Details Name: [name] MRN: [id number] DOB: Sep 30, 1955   Cancelled Treatment:    Reason Eval/Treat Not Completed: Medical issues which prohibited therapy;Other (comment)(Pt chart reviewed, current potassium level 5.3. PT will hold and assess pt appropriateness for PT tomorrow AM. )   Lieutenant Diego PT, DPT 3:00 PM,04/11/18 [phone]

## 2018-04-11 NOTE — Progress Notes (Signed)
Patient ID: [name], male   DOB: [birth year], 63 y.o.   MRN: [id number]  [employer] PROGRESS NOTE  Korver Graybeal QQP:[id number] DOB: 02-07-1956 DOA: 03/24/2018 PCP: Dianne Dun, MD  HPI/Subjective: Patient still coughing a lot.  Brought up a big green hunk of phlegm today.  States he needs his methadone 20 mg 4 times a day.  Still not feeling great.  Still short of breath.  Objective: Vitals:   04/11/18 0459 04/11/18 0803  BP: (!) 170/86 (!) 156/74  Pulse: 82 77  Resp: 17   Temp: 97.7 F (36.5 C) 97.8 F (36.6 C)  SpO2: 92% 91%    Filed Weights   03/13/2018 2204 04/08/18 0345 04/11/18 0459  Weight: 59 kg 62.3 kg 60.9 kg    ROS: Review of Systems  Constitutional: Negative for chills and fever.  HENT: Positive for tinnitus.   Eyes: Negative for blurred vision.  Respiratory: Positive for cough, sputum production and shortness of breath.   Cardiovascular: Negative for chest pain.  Gastrointestinal: Negative for abdominal pain, constipation, diarrhea, nausea and vomiting.  Genitourinary: Negative for dysuria.  Musculoskeletal: Positive for neck pain. Negative for joint pain.  Neurological: Negative for dizziness and headaches.   Exam: Physical Exam  Constitutional: He is oriented to person, place, and time.  HENT:  Nose: No mucosal edema.  Mouth/Throat: No oropharyngeal exudate or posterior oropharyngeal edema.  Eyes: Pupils are equal, round, and reactive to light. Conjunctivae, EOM and lids are normal.  Neck: No JVD present. Carotid bruit is not present. No edema present. No thyroid mass and no thyromegaly present.  Cardiovascular: S1 normal and S2 normal. Exam reveals no gallop.  No murmur heard. Pulses:      Dorsalis pedis pulses are 2+ on the right side and 2+ on the left side.  Respiratory: No respiratory distress. He has decreased breath sounds in the right lower field and the left lower field. He has no wheezes. He has no rhonchi. He has no rales.   GI: Soft. Bowel sounds are normal. There is no abdominal tenderness.  Musculoskeletal:     Left ankle: He exhibits no swelling.  Lymphadenopathy:    He has no cervical adenopathy.  Neurological: He is alert and oriented to person, place, and time. No cranial nerve deficit.  Skin: Skin is warm. No rash noted. Nails show no clubbing.  Psychiatric: He has a normal mood and affect.      Data Reviewed: Basic Metabolic Panel: Recent Labs  Lab 04/07/18 0231 04/08/18 0328 04/09/18 0431 04/09/18 1954 04/10/18 0521 04/11/18 0423  NA 133* 130* 130*  --  130* 131*  K 4.8 5.1 5.2* 5.1 5.4* 5.3*  CL 108 105 102  --  104 103  CO2 20* 21* 23  --  23 25  GLUCOSE 252* 232* 250*  --  327* 238*  BUN 22 24* 27*  --  35* 30*  CREATININE 0.80 0.80 0.91  --  0.94 0.82  CALCIUM 8.5* 8.9 9.0  --  9.0 8.9   Liver Function Tests: Recent Labs  Lab 04/04/18 1712  AST 41  ALT 47*  ALKPHOS 206*  BILITOT 1.0  PROT 6.4*  ALBUMIN 3.3*    Recent Labs  Lab 04/04/18 1712  AMMONIA 71*   CBC: Recent Labs  Lab 04/04/18 1712  03/14/2018 2149 04/07/18 0231 04/08/18 0328 04/09/18 0431 04/10/18 0521 04/11/18 0423  WBC 9.8  --  9.8 11.0* 11.6* 11.5* 9.7 8.7  NEUTROABS 7.6  --  8.4*  --   --   --   --   --  HGB 13.3   < > 12.9* 12.5* 11.8* 12.9* 13.3 13.2  HCT 42.1   < > 38.5* 37.2* 34.8* 38.2* 39.6 39.6  MCV 103.2*  --  98.0 99.2 96.4 96.2 98.3 96.8  PLT 115*  --  98* 100* 92* 100* 89* 76*   < > = values in this interval not displayed.   Cardiac Enzymes: Recent Labs  Lab 04/08/2018 2149  TROPONINI <0.03   BNP (last 3 results) Recent Labs    07/11/17 2016 07/21/17 1332 08/06/17 0402  BNP 179.0* 40.0 46.0     CBG: Recent Labs  Lab 04/10/18 1143 04/10/18 1638 04/10/18 2106 04/11/18 0805 04/11/18 1221  GLUCAP 385* 374* 280* 217* 293*    Recent Results (from the past 240 hour(s))  Blood culture (routine x 2)     Status: None (Preliminary result)   Collection Time: 04/07/18  12:22 AM  Result Value Ref Range Status   Specimen Description BLOOD RIGHT ARM  Final   Special Requests   Final    BOTTLES DRAWN AEROBIC AND ANAEROBIC Blood Culture adequate volume   Culture   Final    NO GROWTH 4 DAYS Performed at [facility], [address] [place], [place] [address]    Report Status PENDING  Incomplete  Blood culture (routine x 2)     Status: None (Preliminary result)   Collection Time: 04/07/18  2:31 AM  Result Value Ref Range Status   Specimen Description BLOOD BLOOD LEFT HAND  Final   Special Requests   Final    BOTTLES DRAWN AEROBIC AND ANAEROBIC Blood Culture adequate volume   Culture   Final    NO GROWTH 4 DAYS Performed at [facility], [address], [place], [place] [address]    Report Status PENDING  Incomplete     Studies: Dg Cervical Spine 2 Or 3 Views  Result Date: 04/10/2018 CLINICAL DATA:  Neck pain. EXAM: CERVICAL SPINE - 2-3 VIEW COMPARISON:  12/21/2016. FINDINGS: Soft tissue structures are unremarkable. Diffuse osteopenia. Degenerative change C6-C7. No acute abnormality identified. Bilateral carotid vascular calcification. IMPRESSION: 1.  Diffuse osteopenia and degenerative change. 2.  Bilateral carotid vascular disease. Electronically Signed   By: Marcello Moores  Register   On: 04/10/2018 15:20    Scheduled Meds: . acetylcysteine  4 mL Nebulization TID  . alfuzosin  10 mg Oral Q breakfast  . ALPRAZolam  1 mg Oral TID  . arformoterol  15 mcg Nebulization BID  . azithromycin  250 mg Oral Daily  . budesonide (PULMICORT) nebulizer solution  0.5 mg Nebulization BID  . carvedilol  12.5 mg Oral BID WC  . chlorpheniramine-HYDROcodone  5 mL Oral Q12H  . fluconazole  100 mg Oral Daily  . fluticasone  2 spray Each Nare Daily  . furosemide  20 mg Oral Daily  . heparin injection (subcutaneous)  5,000 Units Subcutaneous Q8H  . insulin aspart  0-15 Units Subcutaneous TID WC  . insulin aspart  0-5 Units Subcutaneous QHS  . insulin  aspart  8 Units Subcutaneous TID WC  . insulin glargine  16 Units Subcutaneous QHS  . ipratropium-albuterol  3 mL Nebulization TID  . methadone  20 mg Oral Q6H  . methylPREDNISolone (SOLU-MEDROL) injection  40 mg Intravenous Q12H  . patiromer  8.4 g Oral Daily  . rifaximin  550 mg Oral BID  . sodium chloride flush  10 mL Intravenous Q12H  . vitamin C  250 mg Oral Daily   Continuous Infusions: . cefTRIAXone (  ROCEPHIN)  IV 1 g (04/10/18 1539)    Assessment/Plan:  1. Pneumonia and COPD exacerbation.  Sepsis ruled out.  Continue Rocephin and Zithromax.  Continue steroids. 2. Thrush.  Start Diflucan. 3. Hyperkalemia on [place] 4. Liver cirrhosis on right for [place] 5. Chronic pain on methadone 6. Numerous hospitalizations.  Appreciate palliative care consultation.  Patient will continue to be high risk for readmission for the rest of his life. 7. Type 2 diabetes mellitus on Lantus and aspart insulin  Code Status:     Code Status Orders  (From admission, onward)         Start     Ordered   04/07/18 0224  Full code  Continuous     04/07/18 0223        Code Status History    Date Active Date Inactive Code Status Order ID Comments User Context   04/04/2018 2043 04/05/2018 1613 Full Code [id number]  Quintella Baton, MD Inpatient   03/13/2018 0134 03/16/2018 1723 Full Code [id number]  Gorden Harms, MD ED   08/04/2017 0055 08/09/2017 1712 Full Code [id number]  Amelia Jo, MD Inpatient   07/21/2017 2303 07/26/2017 1802 Full Code [id number]  Bettey Costa, MD Inpatient   07/12/2017 0139 07/15/2017 1916 Full Code [id number]  Lance Coon, MD Inpatient   12/21/2016 2159 12/23/2016 1715 Full Code [id number]  Idelle Crouch, MD Inpatient   09/09/2016 2250 09/16/2016 1727 Full Code [id number]  Gladstone Lighter, MD Inpatient   09/09/2016 1934 09/09/2016 2250 Full Code [id number]  Nena Polio, MD ED   09/03/2016 2152 09/05/2016 1844 Full Code [id number]  Henreitta Leber, MD Inpatient   11/06/2014 0551  11/07/2014 1818 Full Code [id number]  Juluis Mire, MD Inpatient   08/10/2014 2023 08/12/2014 1623 Full Code [id number]  Theodoro Grist, MD Inpatient     Disposition Plan: Needs to breathe better prior to disposition  Antibiotics:  Rocephin  Zithromax  Time spent: 28 minutes  [place]

## 2018-04-11 DEATH — deceased

## 2018-04-12 ENCOUNTER — Inpatient Hospital Stay: Payer: Medicaid Other

## 2018-04-12 DIAGNOSIS — K922 Gastrointestinal hemorrhage, unspecified: Secondary | ICD-10-CM

## 2018-04-12 LAB — CBC
HCT: 40 % (ref 39.0–52.0)
Hemoglobin: 13.3 g/dL (ref 13.0–17.0)
MCH: 32.9 pg (ref 26.0–34.0)
MCHC: 33.3 g/dL (ref 30.0–36.0)
MCV: 99 fL (ref 80.0–100.0)
Platelets: 68 10*3/uL — ABNORMAL LOW (ref 150–400)
RBC: 4.04 MIL/uL — ABNORMAL LOW (ref 4.22–5.81)
RDW: 14.8 % (ref 11.5–15.5)
WBC: 10.5 10*3/uL (ref 4.0–10.5)
nRBC: 0 % (ref 0.0–0.2)

## 2018-04-12 LAB — CBC WITH DIFFERENTIAL/PLATELET
Abs Immature Granulocytes: 0.17 10*3/uL — ABNORMAL HIGH (ref 0.00–0.07)
Basophils Absolute: 0 10*3/uL (ref 0.0–0.1)
Basophils Relative: 0 %
EOS ABS: 0 10*3/uL (ref 0.0–0.5)
EOS PCT: 0 %
HCT: 37.5 % — ABNORMAL LOW (ref 39.0–52.0)
Hemoglobin: 12.5 g/dL — ABNORMAL LOW (ref 13.0–17.0)
Immature Granulocytes: 2 %
Lymphocytes Relative: 4 %
Lymphs Abs: 0.4 10*3/uL — ABNORMAL LOW (ref 0.7–4.0)
MCH: 32.7 pg (ref 26.0–34.0)
MCHC: 33.3 g/dL (ref 30.0–36.0)
MCV: 98.2 fL (ref 80.0–100.0)
Monocytes Absolute: 0.9 10*3/uL (ref 0.1–1.0)
Monocytes Relative: 9 %
Neutro Abs: 8.1 10*3/uL — ABNORMAL HIGH (ref 1.7–7.7)
Neutrophils Relative %: 85 %
Platelets: 68 10*3/uL — ABNORMAL LOW (ref 150–400)
RBC: 3.82 MIL/uL — ABNORMAL LOW (ref 4.22–5.81)
RDW: 14.8 % (ref 11.5–15.5)
WBC: 9.5 10*3/uL (ref 4.0–10.5)
nRBC: 0 % (ref 0.0–0.2)

## 2018-04-12 LAB — HEMOGLOBIN: Hemoglobin: 13.2 g/dL (ref 13.0–17.0)

## 2018-04-12 LAB — COMPREHENSIVE METABOLIC PANEL
ALT: 50 U/L — ABNORMAL HIGH (ref 0–44)
ANION GAP: 4 — AB (ref 5–15)
AST: 43 U/L — ABNORMAL HIGH (ref 15–41)
Albumin: 2.8 g/dL — ABNORMAL LOW (ref 3.5–5.0)
Alkaline Phosphatase: 148 U/L — ABNORMAL HIGH (ref 38–126)
BUN: 31 mg/dL — ABNORMAL HIGH (ref 8–23)
CO2: 23 mmol/L (ref 22–32)
Calcium: 9.1 mg/dL (ref 8.9–10.3)
Chloride: 99 mmol/L (ref 98–111)
Creatinine, Ser: 0.83 mg/dL (ref 0.61–1.24)
GFR calc Af Amer: >60 mL/min (ref >60)
GFR calc non Af Amer: >60 mL/min (ref >60)
Glucose, Bld: 338 mg/dL — ABNORMAL HIGH (ref 70–99)
Potassium: 5.6 mmol/L — ABNORMAL HIGH (ref 3.5–5.1)
Sodium: 126 mmol/L — ABNORMAL LOW (ref 135–145)
TOTAL PROTEIN: 5.5 g/dL — AB (ref 6.5–8.1)
Total Bilirubin: 1 mg/dL (ref 0.3–1.2)

## 2018-04-12 LAB — PROTIME-INR
INR: 1.21
PROTHROMBIN TIME: 15.2 s (ref 11.4–15.2)

## 2018-04-12 LAB — CULTURE, BLOOD (ROUTINE X 2)
Culture: NO GROWTH
Culture: NO GROWTH
Special Requests: ADEQUATE
Special Requests: ADEQUATE

## 2018-04-12 LAB — GLUCOSE, CAPILLARY
Glucose-Capillary: 264 mg/dL — ABNORMAL HIGH (ref 70–99)
Glucose-Capillary: 318 mg/dL — ABNORMAL HIGH (ref 70–99)
Glucose-Capillary: 288 mg/dL — ABNORMAL HIGH (ref 70–99)

## 2018-04-12 LAB — POTASSIUM: Potassium: 4.8 mmol/L (ref 3.5–5.1)

## 2018-04-12 MED ORDER — SODIUM CHLORIDE 0.9 % IV SOLN
50.0000 ug/h | INTRAVENOUS | Status: DC
  Administered 2018-04-12: 50 ug/h via INTRAVENOUS
  Filled 2018-04-12 (×5): qty 1

## 2018-04-12 MED ORDER — SODIUM CHLORIDE 0.9 % IV SOLN
50.0000 ug/h | INTRAVENOUS | Status: DC
  Filled 2018-04-12 (×2): qty 1

## 2018-04-12 MED ORDER — OCTREOTIDE LOAD VIA INFUSION
50.0000 ug | Freq: Once | INTRAVENOUS | Status: DC
  Filled 2018-04-12: qty 25

## 2018-04-12 MED ORDER — SODIUM POLYSTYRENE SULFONATE 15 GM/60ML PO SUSP
30.0000 g | Freq: Once | ORAL | Status: AC
  Administered 2018-04-12: 30 g via ORAL
  Filled 2018-04-12: qty 120

## 2018-04-12 MED ORDER — METHYLPREDNISOLONE SODIUM SUCC 40 MG IJ SOLR
40.0000 mg | Freq: Every day | INTRAMUSCULAR | Status: DC
  Administered 2018-04-12: 40 mg via INTRAVENOUS
  Filled 2018-04-12: qty 1

## 2018-04-13 LAB — GLUCOSE, CAPILLARY: Glucose-Capillary: 157 mg/dL — ABNORMAL HIGH (ref 70–99)

## 2018-04-13 SURGERY — ESOPHAGOGASTRODUODENOSCOPY (EGD) WITH PROPOFOL
Anesthesia: General

## 2018-04-16 LAB — VITAMIN C: Vitamin C: 2.3 mg/dL — ABNORMAL HIGH (ref 0.2–2.0)

## 2018-05-10 NOTE — Progress Notes (Addendum)
Notified Dr. Ara Kussmaul overnight of pnt complaint of RUQ epigastric pain which he believes is indigestion. Maalox given with some relief however upon assessment, colostomy bag drained approximately 75-100 ml's of frank red blood.  Gave protonix per order, brought for abdominal CT, notified Dr. Ara Kussmaul of CT impression, reading abdominal and splenic varices. Hgb collected which was  13.3.   Another 300 ml's of red blood into colostomy early this am but epigastric pain has mostly resolved. Repeat Hgb at 0500 resulted which was 12.5. 0600 Heparin not given due to bleeding and low platelets.   Reported to day nurse of increase bleeding who will continue to monitor throughout day.
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
## 2018-05-10 NOTE — Progress Notes (Signed)
Spoke to pnts sister Marlowe Kays who said she will be coming in shortly along with mother to see pnt.

## 2018-05-10 NOTE — Death Summary Note (Signed)
DEATH SUMMARY   Patient Details  Name: [name] MRN: [id number] DOB: Feb 05, 1956  Admission/Discharge Information   Admit Date:  04/09/18  Date of Death:    Time of Death:    Length of Stay: 5  Referring Physician: Dianne Dun, MD   Reason(s) for Hospitalization  Shortness of breath  Diagnoses  Preliminary cause of death:  Secondary Diagnoses (including complications and co-morbidities):  Principal Problem:   Sepsis ([place]) Active Problems:   DM (diabetes mellitus) ([place])   HCAP (healthcare-associated pneumonia)   COPD with acute exacerbation ([place])   GERD (gastroesophageal reflux disease)   HTN (hypertension)   Brief Hospital Course (including significant findings, care, treatment, and services provided and events leading to death)  [name] is a 63 y.o. year old male who has numerous hospitalizations.  He was admitted this time with pneumonia and COPD exacerbation.  Sepsis was ruled out.  The patient was on Rocephin and Zithromax and steroids.  His breathing remained tight during the hospital course but gradually was moving better air during the hospital course.  The patient started having dark red blood in his ostomy.  The patient did have a history of cirrhosis so we started him on octreotide drip and IV Protonix.  Serial hemoglobins remained stable.  Last hemoglobin was 13.2.  GI was planning on procedures for 04/13/2018.  For thrush she was started on Diflucan.  For his hyperkalemia he was treated for this and repeat potassium did come down in the afternoon on Apr 15, 2018 two 4.8.Marland Kitchen  The patient has liver cirrhosis and was on Xifaxan.  The patient also had chronic pain on methadone.  For his type 2 diabetes mellitus he was on Lantus and aspart insulin.  Last sugar was 157.  In reviewing nursing notes around 8 PM on 04-15-2018, it looks like he was having some difficulty breathing at that time.  Nurse noted the monitor and where his heart rate went down into the 20s and  then to 0.  And a CODE BLUE was called.  Code team responded to the code.  The patient was in asystole the entire time.  The patient was pronounced dead by the code team.    Pertinent Labs and Studies  Significant Diagnostic Studies Ct Abdomen Pelvis Wo Contrast  Result Date: 2018/04/15 CLINICAL DATA:  Non pulsatile palpable abdominal mass. EXAM: CT ABDOMEN AND PELVIS WITHOUT CONTRAST TECHNIQUE: Multidetector CT imaging of the abdomen and pelvis was performed following the standard protocol without IV contrast. COMPARISON:  08/03/2017 FINDINGS: Lower chest: Prominent emphysematous changes and scarring in the lung bases. Coronary artery calcifications. Hepatobiliary: Changes of hepatic cirrhosis with nodular contour, heterogeneous parenchymal pattern, and enlarged lateral segment and caudate lobes. Cholelithiasis with gallbladder wall thickening. Wall thickening is likely due to cirrhosis. No inflammatory stranding. No bile duct dilatation. Pancreas: Unremarkable. No pancreatic ductal dilatation or surrounding inflammatory changes. Spleen: Spleen is mildly enlarged. 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 Adrenals/Urinary Tract: Adrenal glands are unremarkable. Kidneys are normal, without renal calculi, focal lesion, or hydronephrosis. Bladder is unremarkable. Stomach/Bowel: Stomach, small bowel, and colon are not abnormally distended. Right lower quadrant ileostomy. Small peristomal hernia containing small bowel. Vascular/Lymphatic: Prominent diffuse calcification of the aorta, iliac, and external iliac arteries. Probable areas of high-grade stenosis in the iliac and external iliac arteries. No aneurysm. No retroperitoneal lymphadenopathy. Reproductive: Prostate is unremarkable. Other: No free air or free fluid in the abdomen. Upper abdominal varices are present. Edema in the subcutaneous fat over the low abdomen and pelvis.  Musculoskeletal: Degenerative changes in the spine. No destructive bone lesions.  IMPRESSION: 1. Changes of hepatic cirrhosis with portal venous hypertension, upper abdominal varices, splenic vein varices, and splenic. 2. Cholelithiasis with gallbladder wall thickening likely due to cirrhosis. No inflammatory changes. 3. Right lower quadrant ileostomy with small peristomal hernia containing small bowel. No evidence of bowel obstruction or inflammation. 4. Emphysematous changes and scarring in the lung bases. 5. Extensive aorta iliac atherosclerosis with probable focal calcific stenosis of iliac vessels bilaterally. No aneurysm. Aortic Atherosclerosis (ICD10-I70.0) and Emphysema (ICD10-J43.9). Electronically Signed   By: Lucienne Capers M.D.   On: May 05, 2018 00:47   Dg Cervical Spine 2 Or 3 Views  Result Date: 04/10/2018 CLINICAL DATA:  Neck pain. EXAM: CERVICAL SPINE - 2-3 VIEW COMPARISON:  12/21/2016. FINDINGS: Soft tissue structures are unremarkable. Diffuse osteopenia. Degenerative change C6-C7. No acute abnormality identified. Bilateral carotid vascular calcification. IMPRESSION: 1.  Diffuse osteopenia and degenerative change. 2.  Bilateral carotid vascular disease. Electronically Signed   By: Marcello Moores  Register   On: 04/10/2018 15:20   Dg Chest Port 1 View  Result Date: 04/05/2018 CLINICAL DATA:  Cough and shortness of breath. EXAM: PORTABLE CHEST 1 VIEW COMPARISON:  03/12/2018 FINDINGS: Emphysema with chronic bronchial thickening. Unchanged heart size and mediastinal contours, bilateral hilar prominence again seen. Aortic atherosclerosis. Vague slightly streaky opacity in the right midlung zone. No confluent airspace disease. Mild interstitial opacities are again seen. No pneumothorax or large pleural effusion. No acute osseous abnormalities. IMPRESSION: 1. Vague slightly streaky opacities in the right midlung zone, may reflect atelectasis or early pneumonia. 2. Emphysema and chronic bronchial thickening. 3. Aortic Atherosclerosis (ICD10-I70.0) and Emphysema (ICD10-J43.9).  Electronically Signed   By: Keith Rake M.D.   On: 03/12/2018 22:17    Microbiology Recent Results (from the past 240 hour(s))  Blood culture (routine x 2)     Status: None   Collection Time: 04/07/18 12:22 AM  Result Value Ref Range Status   Specimen Description BLOOD RIGHT ARM  Final   Special Requests   Final    BOTTLES DRAWN AEROBIC AND ANAEROBIC Blood Culture adequate volume   Culture   Final    NO GROWTH 5 DAYS Performed at [facility], [address] [place], [place] [address]    Report Status 2018-05-05 FINAL  Final  Blood culture (routine x 2)     Status: None   Collection Time: 04/07/18  2:31 AM  Result Value Ref Range Status   Specimen Description BLOOD BLOOD LEFT HAND  Final   Special Requests   Final    BOTTLES DRAWN AEROBIC AND ANAEROBIC Blood Culture adequate volume   Culture   Final    NO GROWTH 5 DAYS Performed at [facility], [address] [place], [place] [address]    Report Status May 05, 2018 FINAL  Final    Lab Basic Metabolic Panel: Recent Labs  Lab 04/08/18 0328 04/09/18 0431 04/09/18 1954 04/10/18 0521 04/11/18 0423 May 05, 2018 0452 05/05/18 1517  NA 130* 130*  --  130* 131* 126*  --   K 5.1 5.2* 5.1 5.4* 5.3* 5.6* 4.8  CL 105 102  --  104 103 99  --   CO2 21* 23  --  23 25 23   --   GLUCOSE 232* 250*  --  327* 238* 338*  --   BUN 24* 27*  --  35* 30* 31*  --   CREATININE 0.80 0.91  --  0.94 0.82 0.83  --   CALCIUM  8.9 9.0  --  9.0 8.9 9.1  --    Liver Function Tests: Recent Labs  Lab 26-Apr-2018 0452  AST 43*  ALT 50*  ALKPHOS 148*  BILITOT 1.0  PROT 5.5*  ALBUMIN 2.8*   CBC: Recent Labs  Lab 03/11/2018 2149  04/09/18 0431 04/10/18 0521 04/11/18 0423 04-26-2018 0028 2018-04-26 0452 26-Apr-2018 1517  WBC 9.8   < > 11.5* 9.7 8.7 10.5 9.5  --   NEUTROABS 8.4*  --   --   --   --   --  8.1*  --   HGB 12.9*   < > 12.9* 13.3 13.2 13.3 12.5* 13.2  HCT 38.5*   < > 38.2* 39.6 39.6 40.0 37.5*  --   MCV 98.0   < >  96.2 98.3 96.8 99.0 98.2  --   PLT 98*   < > 100* 89* 76* 68* 68*  --    < > = values in this interval not displayed.   Cardiac Enzymes: Recent Labs  Lab 03/12/2018 2149  TROPONINI <0.03   Sepsis Labs: Recent Labs  Lab 03/13/2018 2149  04/07/18 1414 04/08/18 0328 04/09/18 0431 04/10/18 0521 04/11/18 0423 04/26/18 0028 04/26/2018 0452  PROCALCITON  --   --  <0.10 <0.10 <0.10  --   --   --   --   WBC 9.8   < >  --  11.6* 11.5* 9.7 8.7 10.5 9.5  LATICACIDVEN 1.6  --   --   --   --   --   --   --   --    < > = values in this interval not displayed.       Yaniah Thiemann [employer] 04/13/2018, 1:57 PM
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
## 2018-05-10 NOTE — Progress Notes (Signed)
Patient had a panic attack just before shift change.  RN gave him a breathing treatment (he reported difficulty breathing). RN helped patient do relaxation and deep breathing.  Patient stated he believed in God; RN prayed with him per his request.  Patient calmed down.  RN got a nasal canula because patient reported not liking the mask.  He wanted to remove it after the medicine finished.  RN was in the hall looking for the nasal canula when RN and another RN noticed on the monitor in the hall that he was at 20 heart rate.  Then it went to zero as they were running to the room trying to check him.  Code was called.  Nasal canula applied with O2 and many staff members came to help with the code.  Phillis Knack, RN

## 2018-05-10 NOTE — Progress Notes (Signed)
Patient ID: [name], male   DOB: 11-28-1955, 63 y.o.   MRN: [id number]   [facility] PROGRESS NOTE  [name] VQQ:[id number] DOB: 1956/01/01 DOA: 04/09/2018 PCP: Dianne Dun, MD  HPI/Subjective: Patient had a rough night.  States he developed abdominal pain and started bleeding in his ostomy.  Still coughing a lot.  Still coughing up sputum.  Not feeling well at all.  Objective: Vitals:   05-10-2018 0316 May 10, 2018 0753  BP: (!) 157/79 (!) 178/86  Pulse: 80 80  Resp: 18   Temp: 98 F (36.7 C) 97.9 F (36.6 C)  SpO2: 91% 92%    Filed Weights   04/03/2018 2204 04/08/18 0345 04/11/18 0459  Weight: 59 kg 62.3 kg 60.9 kg    ROS: Review of Systems  Constitutional: Negative for chills and fever.  HENT: Positive for tinnitus.   Eyes: Negative for blurred vision.  Respiratory: Positive for cough, sputum production and shortness of breath.   Cardiovascular: Negative for chest pain.  Gastrointestinal: Positive for abdominal pain and blood in stool. Negative for constipation, diarrhea, nausea and vomiting.  Genitourinary: Negative for dysuria.  Musculoskeletal: Positive for neck pain. Negative for joint pain.  Neurological: Negative for dizziness and headaches.   Exam: Physical Exam  Constitutional: He is oriented to person, place, and time.  HENT:  Nose: No mucosal edema.  Mouth/Throat: No oropharyngeal exudate or posterior oropharyngeal edema.  Eyes: Pupils are equal, round, and reactive to light. Conjunctivae, EOM and lids are normal.  Neck: No JVD present. Carotid bruit is not present. No edema present. No thyroid mass and no thyromegaly present.  Cardiovascular: S1 normal and S2 normal. Exam reveals no gallop.  No murmur heard. Pulses:      Dorsalis pedis pulses are 2+ on the right side and 2+ on the left side.  Respiratory: No respiratory distress. He has decreased breath sounds in the right lower field and the left lower field. He has no wheezes. He has no  rhonchi. He has no rales.  GI: Soft. Bowel sounds are normal. There is abdominal tenderness.  Musculoskeletal:     Right ankle: He exhibits no swelling.     Left ankle: He exhibits no swelling.  Lymphadenopathy:    He has no cervical adenopathy.  Neurological: He is alert and oriented to person, place, and time. No cranial nerve deficit.  Skin: Skin is warm. No rash noted. Nails show no clubbing.  Psychiatric: He has a normal mood and affect.      Data Reviewed: Basic Metabolic Panel: Recent Labs  Lab 04/08/18 0328 04/09/18 0431 04/09/18 1954 04/10/18 0521 04/11/18 0423 May 10, 2018 0452  NA 130* 130*  --  130* 131* 126*  K 5.1 5.2* 5.1 5.4* 5.3* 5.6*  CL 105 102  --  104 103 99  CO2 21* 23  --  23 25 23   GLUCOSE 232* 250*  --  327* 238* 338*  BUN 24* 27*  --  35* 30* 31*  CREATININE 0.80 0.91  --  0.94 0.82 0.83  CALCIUM 8.9 9.0  --  9.0 8.9 9.1   Liver Function Tests: Recent Labs  Lab 05/10/18 0452  AST 43*  ALT 50*  ALKPHOS 148*  BILITOT 1.0  PROT 5.5*  ALBUMIN 2.8*    CBC: Recent Labs  Lab 04/05/2018 2149  04/09/18 0431 04/10/18 0521 04/11/18 0423 05-10-18 0028 10-May-2018 0452  WBC 9.8   < > 11.5* 9.7 8.7 10.5 9.5  NEUTROABS 8.4*  --   --   --   --   --  8.1*  HGB 12.9*   < > 12.9* 13.3 13.2 13.3 12.5*  HCT 38.5*   < > 38.2* 39.6 39.6 40.0 37.5*  MCV 98.0   < > 96.2 98.3 96.8 99.0 98.2  PLT 98*   < > 100* 89* 76* 68* 68*   < > = values in this interval not displayed.   Cardiac Enzymes: Recent Labs  Lab 03/18/2018 2149  TROPONINI <0.03   BNP (last 3 results) Recent Labs    07/11/17 2016 07/21/17 1332 08/06/17 0402  BNP 179.0* 40.0 46.0     CBG: Recent Labs  Lab 04/11/18 1221 04/11/18 1730 04/11/18 2112 04/22/2018 0755 2018/04/22 1136  GLUCAP 293* 195* 237* 264* 318*    Recent Results (from the past 240 hour(s))  Blood culture (routine x 2)     Status: None   Collection Time: 04/07/18 12:22 AM  Result Value Ref Range Status   Specimen  Description BLOOD RIGHT ARM  Final   Special Requests   Final    BOTTLES DRAWN AEROBIC AND ANAEROBIC Blood Culture adequate volume   Culture   Final    NO GROWTH 5 DAYS Performed at [facility], [address] [place], [place] [address]    Report Status 04/22/2018 FINAL  Final  Blood culture (routine x 2)     Status: None   Collection Time: 04/07/18  2:31 AM  Result Value Ref Range Status   Specimen Description BLOOD BLOOD LEFT HAND  Final   Special Requests   Final    BOTTLES DRAWN AEROBIC AND ANAEROBIC Blood Culture adequate volume   Culture   Final    NO GROWTH 5 DAYS Performed at [facility], [address], [place], [place] [address]    Report Status 04/22/18 FINAL  Final     Studies: Ct Abdomen Pelvis Wo Contrast  Result Date: 04-22-18 CLINICAL DATA:  Non pulsatile palpable abdominal mass. EXAM: CT ABDOMEN AND PELVIS WITHOUT CONTRAST TECHNIQUE: Multidetector CT imaging of the abdomen and pelvis was performed following the standard protocol without IV contrast. COMPARISON:  08/03/2017 FINDINGS: Lower chest: Prominent emphysematous changes and scarring in the lung bases. Coronary artery calcifications. Hepatobiliary: Changes of hepatic cirrhosis with nodular contour, heterogeneous parenchymal pattern, and enlarged lateral segment and caudate lobes. Cholelithiasis with gallbladder wall thickening. Wall thickening is likely due to cirrhosis. No inflammatory stranding. No bile duct dilatation. Pancreas: Unremarkable. No pancreatic ductal dilatation or surrounding inflammatory changes. Spleen: Spleen is mildly enlarged. 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 Adrenals/Urinary Tract: Adrenal glands are unremarkable. Kidneys are normal, without renal calculi, focal lesion, or hydronephrosis. Bladder is unremarkable. Stomach/Bowel: Stomach, small bowel, and colon are not abnormally distended. Right lower quadrant ileostomy. Small peristomal hernia containing small bowel.  Vascular/Lymphatic: Prominent diffuse calcification of the aorta, iliac, and external iliac arteries. Probable areas of high-grade stenosis in the iliac and external iliac arteries. No aneurysm. No retroperitoneal lymphadenopathy. Reproductive: Prostate is unremarkable. Other: No free air or free fluid in the abdomen. Upper abdominal varices are present. Edema in the subcutaneous fat over the low abdomen and pelvis. Musculoskeletal: Degenerative changes in the spine. No destructive bone lesions. IMPRESSION: 1. Changes of hepatic cirrhosis with portal venous hypertension, upper abdominal varices, splenic vein varices, and splenic. 2. Cholelithiasis with gallbladder wall thickening likely due to cirrhosis. No inflammatory changes. 3. Right lower quadrant ileostomy with small peristomal hernia containing small bowel. No evidence of bowel obstruction or inflammation. 4. Emphysematous changes and scarring in the lung bases. 5. Extensive  aorta iliac atherosclerosis with probable focal calcific stenosis of iliac vessels bilaterally. No aneurysm. Aortic Atherosclerosis (ICD10-I70.0) and Emphysema (ICD10-J43.9). Electronically Signed   By: Lucienne Capers M.D.   On: 13-Apr-2018 00:47   Dg Cervical Spine 2 Or 3 Views  Result Date: 04/10/2018 CLINICAL DATA:  Neck pain. EXAM: CERVICAL SPINE - 2-3 VIEW COMPARISON:  12/21/2016. FINDINGS: Soft tissue structures are unremarkable. Diffuse osteopenia. Degenerative change C6-C7. No acute abnormality identified. Bilateral carotid vascular calcification. IMPRESSION: 1.  Diffuse osteopenia and degenerative change. 2.  Bilateral carotid vascular disease. Electronically Signed   By: Marcello Moores  Register   On: 04/10/2018 15:20    Scheduled Meds: . alfuzosin  10 mg Oral Q breakfast  . ALPRAZolam  1 mg Oral TID  . arformoterol  15 mcg Nebulization BID  . azithromycin  250 mg Oral Daily  . budesonide (PULMICORT) nebulizer solution  0.5 mg Nebulization BID  . carvedilol  12.5 mg Oral  BID WC  . chlorpheniramine-HYDROcodone  5 mL Oral Q12H  . fluconazole  100 mg Oral Daily  . fluticasone  2 spray Each Nare Daily  . furosemide  20 mg Oral Daily  . insulin aspart  0-15 Units Subcutaneous TID WC  . insulin aspart  0-5 Units Subcutaneous QHS  . insulin aspart  8 Units Subcutaneous TID WC  . insulin glargine  20 Units Subcutaneous QHS  . ipratropium-albuterol  3 mL Nebulization TID  . methadone  20 mg Oral Q6H  . methylPREDNISolone (SOLU-MEDROL) injection  40 mg Intravenous Daily  . octreotide  50 mcg Intravenous Once  . pantoprazole (PROTONIX) IV  40 mg Intravenous Q12H  . rifaximin  550 mg Oral BID  . sodium chloride flush  10 mL Intravenous Q12H  . vitamin C  250 mg Oral Daily   Continuous Infusions: . sodium chloride Stopped (04/11/18 1635)  . cefTRIAXone (ROCEPHIN)  IV Stopped (04/11/18 1533)  . octreotide  (SANDOSTATIN)    IV infusion 50 mcg/hr (April 13, 2018 1125)    Assessment/Plan:  1. Pneumonia and COPD exacerbation.  Sepsis ruled out.  Continue Rocephin and Zithromax.  Continue steroids but change to once a day dosing 2. GI bleed with dark red blood in his ostomy.  Since patient has a history of cirrhosis so we will start on octreotide drip and continue IV Protonix that was started last night.  Serial hemoglobins.  Appreciate GI consultation.  Will put n.p.o. after midnight tonight for procedure tomorrow.  Repeat hemoglobin this afternoon. 3. Thrush.  Start Diflucan. 4. Hyperkalemia.  Will give dose of Kayexalate today.  Repeat potassium this afternoon 5. Liver cirrhosis on Xifaxan 6. Chronic pain on methadone 7. Numerous hospitalizations.  Appreciate palliative care consultation.  Patient will continue to be high risk for readmission for the rest of his life. 8. Type 2 diabetes mellitus on Lantus and aspart insulin  Code Status:     Code Status Orders  (From admission, onward)         Start     Ordered   04/07/18 0224  Full code  Continuous      04/07/18 0223        Code Status History    Date Active Date Inactive Code Status Order ID Comments User Context   04/04/2018 2043 04/05/2018 1613 Full Code [id number]  Quintella Baton, MD Inpatient   03/13/2018 0134 03/16/2018 1723 Full Code [id number]  Gorden Harms, MD ED   08/04/2017 0055 08/09/2017 1712 Full Code [id number]  Amelia Jo, MD Inpatient  07/21/2017 2303 07/26/2017 1802 Full Code [id number]  Bettey Costa, MD Inpatient   07/12/2017 0139 07/15/2017 1916 Full Code [id number]  Lance Coon, MD Inpatient   12/21/2016 2159 12/23/2016 1715 Full Code [id number]  Idelle Crouch, MD Inpatient   09/09/2016 2250 09/16/2016 1727 Full Code [id number]  Gladstone Lighter, MD Inpatient   09/09/2016 1934 09/09/2016 2250 Full Code [id number]  Nena Polio, MD ED   09/03/2016 2152 09/05/2016 1844 Full Code [id number]  Henreitta Leber, MD Inpatient   11/06/2014 0551 11/07/2014 1818 Full Code [id number]  Juluis Mire, MD Inpatient   08/10/2014 2023 08/12/2014 1623 Full Code [id number]  Theodoro Grist, MD Inpatient     Disposition Plan: Needs to breathe better prior to disposition  Antibiotics:  Rocephin  Zithromax  Time spent: 30 minutes, case discussed with gastroenterology and Aundra Dubin on the phone at [facility]

## 2018-05-10 NOTE — Progress Notes (Signed)
PT Cancellation Note  Patient Details Name: Taz Vanness MRN: [id number] DOB: 1955-11-18   Cancelled Treatment:    Reason Eval/Treat Not Completed: Medical issues which prohibited therapy;Other (comment)(Pt chart reviewed, K+ levels slightly higher than yesterday (5.6), elevated glucose and Na+ 126. PT to follow pt, and will see patient when more medically appropriate. )  Lieutenant Diego PT, DPT 8:17 AM,2018/04/17 [phone]

## 2018-05-10 NOTE — Progress Notes (Signed)
50 mL of blood emptied this morning from his ostomy bag.  Patient and his family are nervous about the blood.  MD was notified.  He checked the ostomy and put in a GI consult.  GI saw him and put him on a liquid diet.  Possible testing will be done tomorrow.  Phillis Knack, RN

## 2018-05-10 DEATH — deceased

## 2018-10-12 IMAGING — CR DG CHEST 2V
2 series · 2 of 2 positions shown · non-contrast
Comparison: 07/15/2016, 03/08/2015 and earlier.

CLINICAL DATA: COPD exacerbation with shortness of breath and
productive cough. Former long-time smoker who recently quit.

EXAM:
CHEST  2 VIEW

[chest pa]
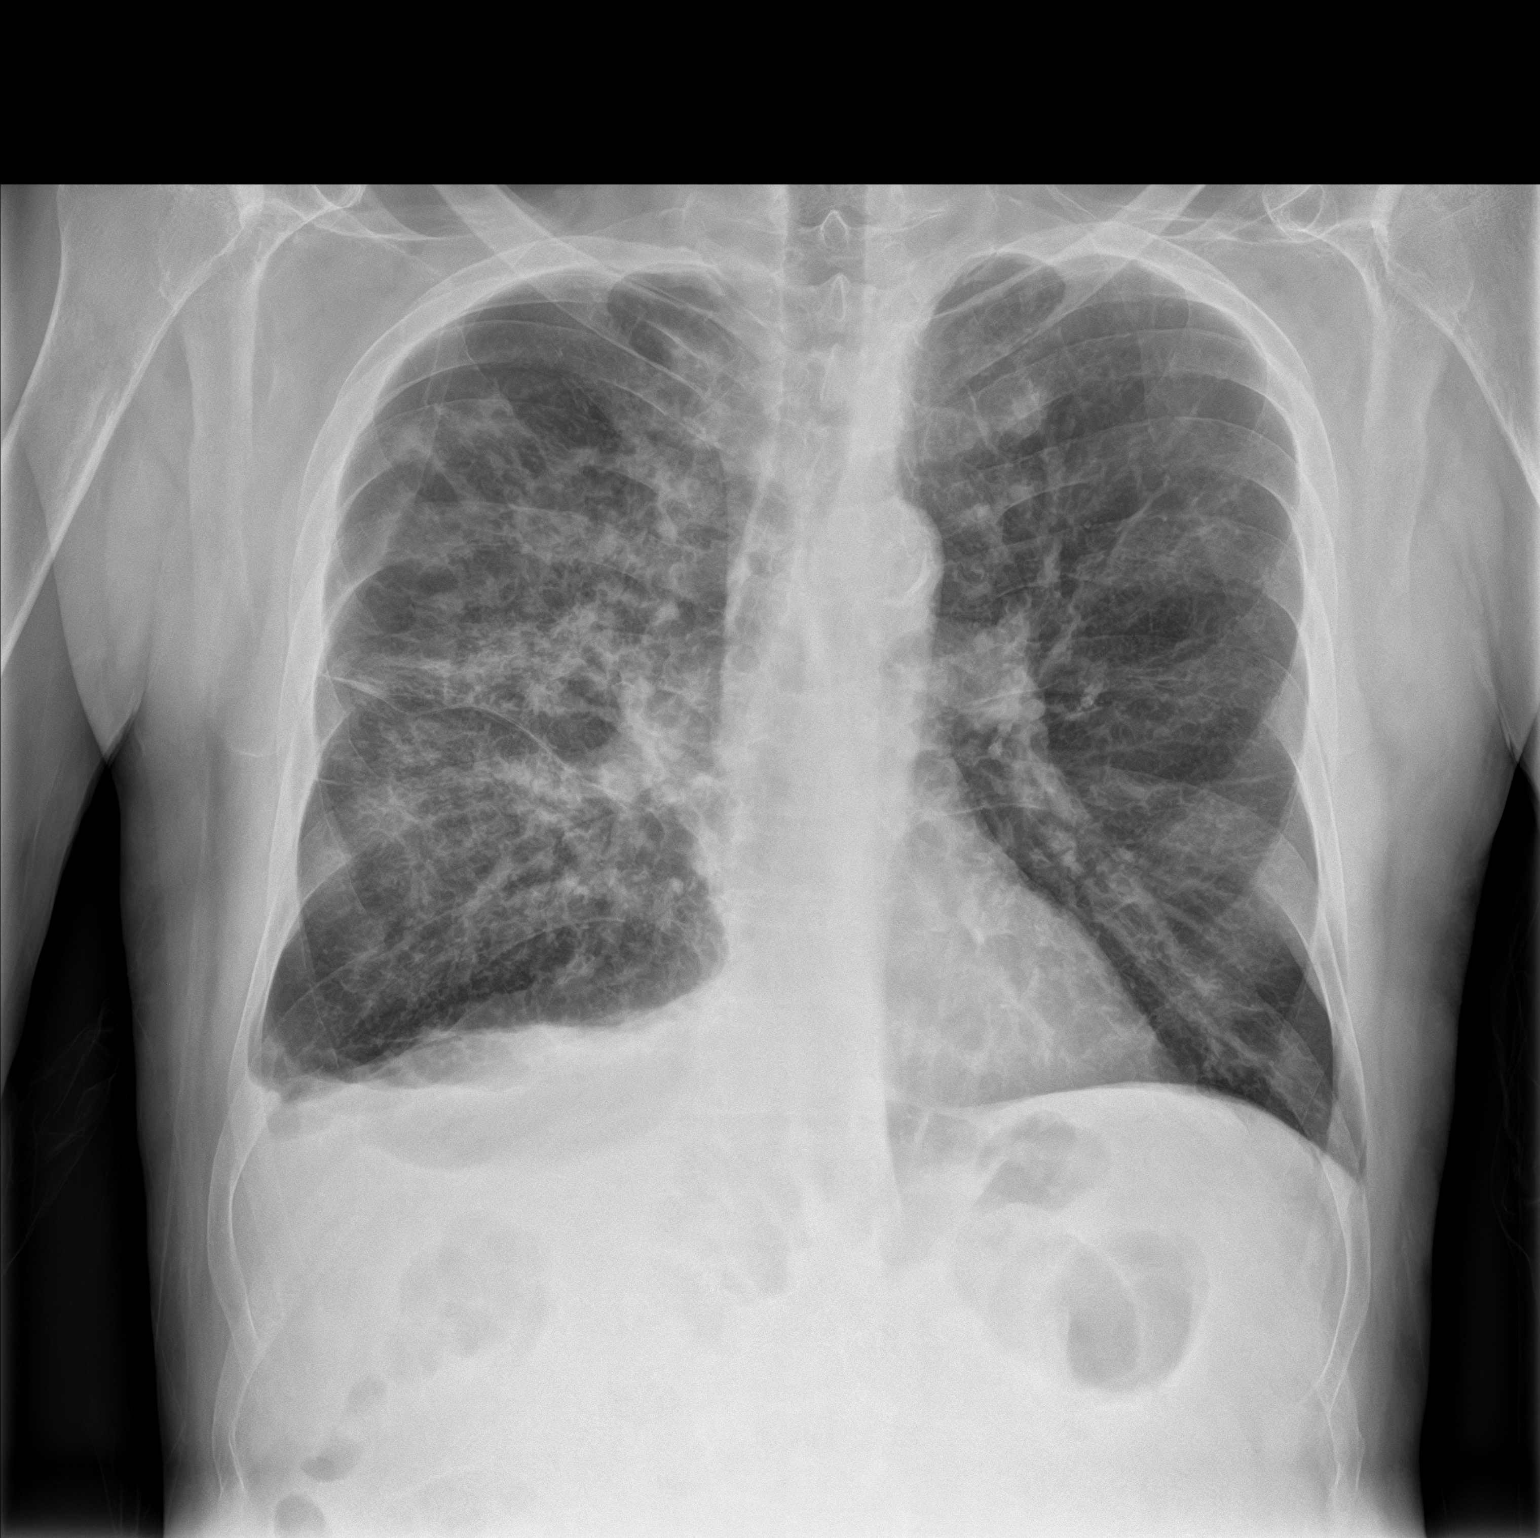

[chest lat]
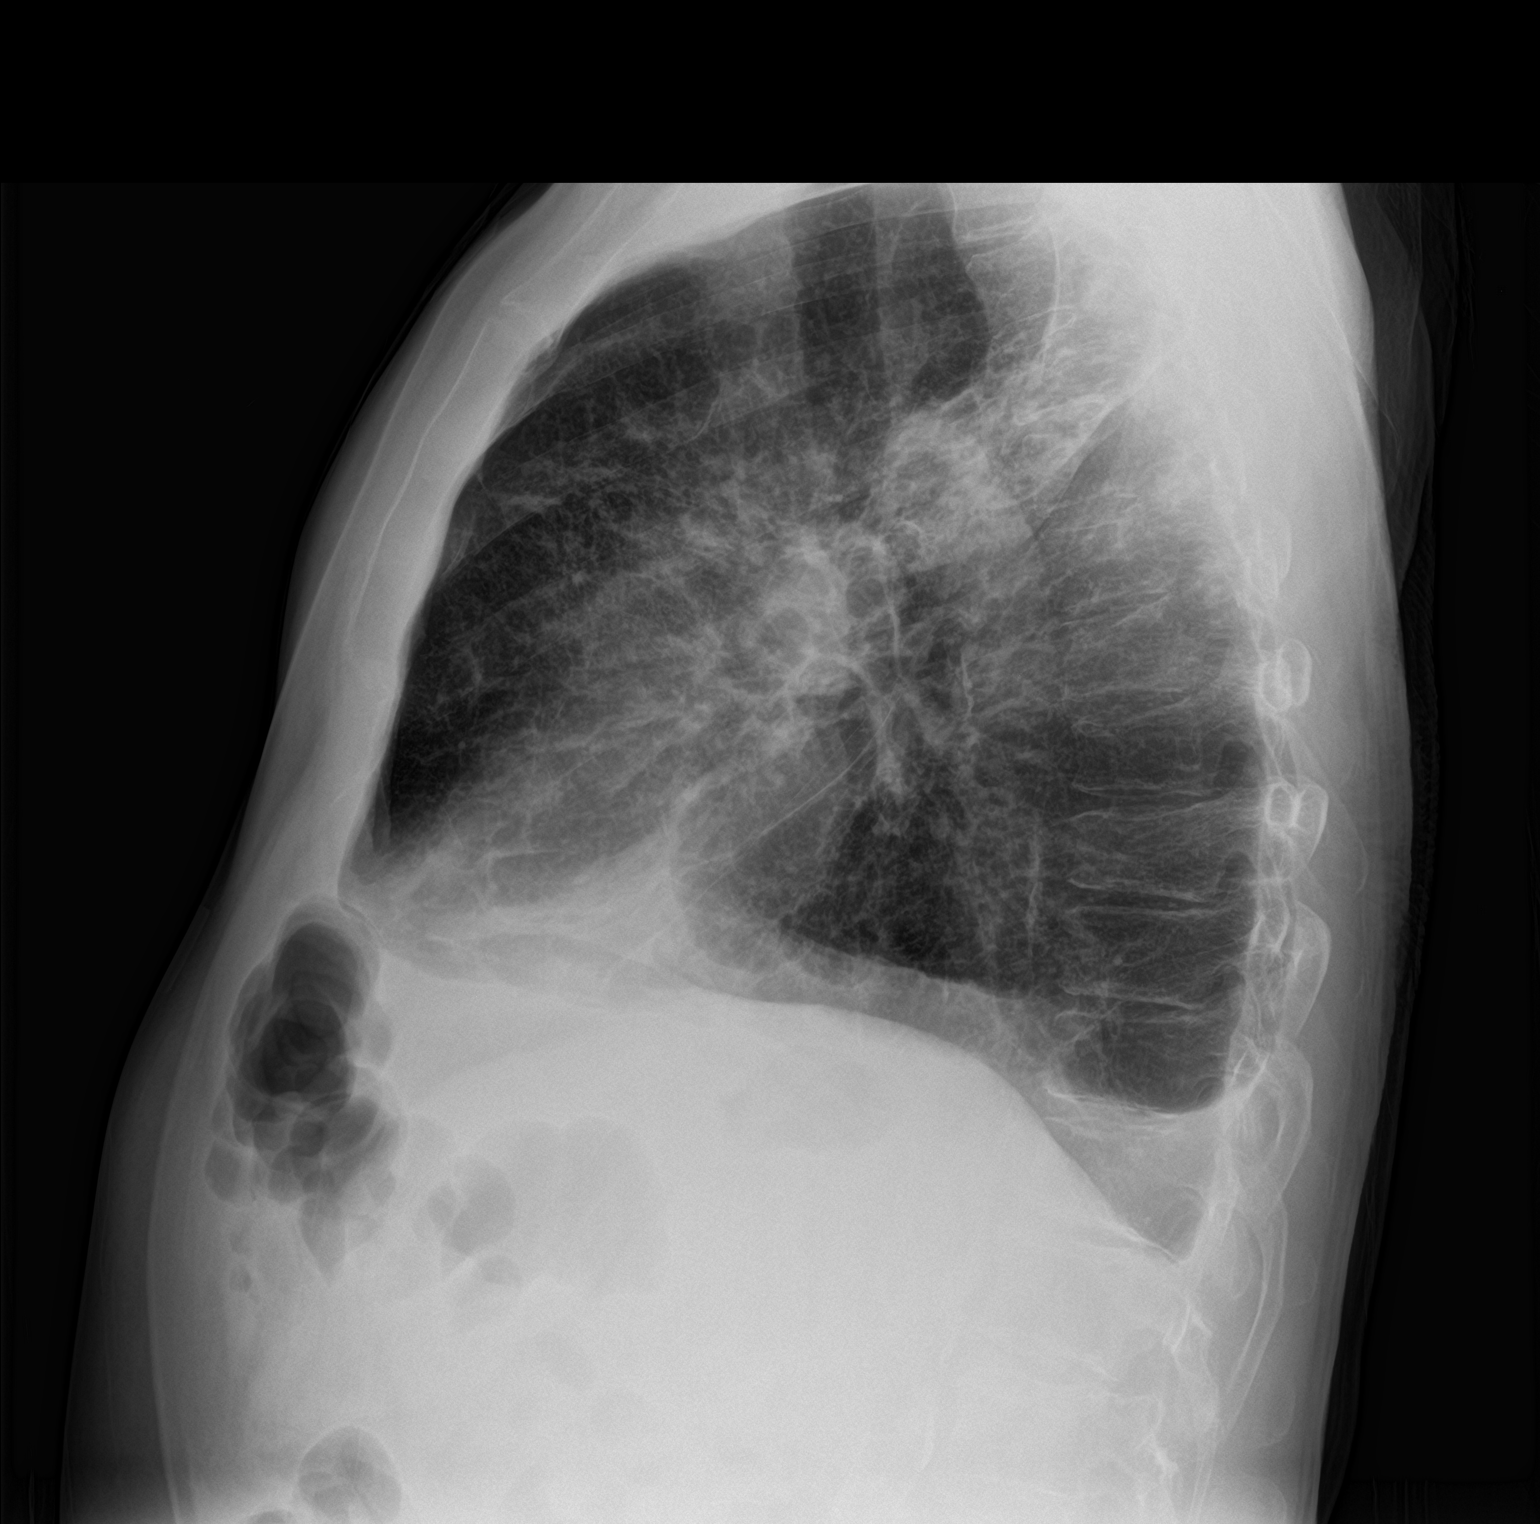

[2 of 2 positions shown; findings below may reference images not displayed]

FINDINGS: Cardiac silhouette normal in size, unchanged. Thoracic aorta
atherosclerotic, unchanged. Hilar and mediastinal contours otherwise
unremarkable. Patchy airspace opacities throughout the right lung
and an associated small to moderate-sized right pleural effusion,
new since the July 2016 exam. Emphysematous changes and parenchymal
scarring involving both lungs. Left lung otherwise clear. No left
pleural effusion. Visualized bony thorax intact.
IMPRESSION: Pneumonia involving the right upper lobe, right middle lobe and to a
lesser degree right lower lobe, superimposed upon COPD/emphysema.
Associated small to moderate-sized right pleural effusion.

## 2019-06-04 IMAGING — CT CT ABD-PELV W/O CM
2 of 4 series · 16 of 46 positions shown, 18 images · non-contrast
Comparison: CTA chest, abdomen, and pelvis from yesterday.

CLINICAL DATA: Hyperdense foci within the rectosigmoid colon on CTA
yesterday.

EXAM:
CT ABDOMEN AND PELVIS WITHOUT CONTRAST
TECHNIQUE: Multidetector CT imaging of the abdomen and pelvis was performed
following the standard protocol without IV contrast.

[Series 2: routine abd/pel wo · axial · 0.71mm/px · z∈[-1285,-875]mm · 13 of 90 slices shown, 15 images]
[im 4/90  soft-tissue]
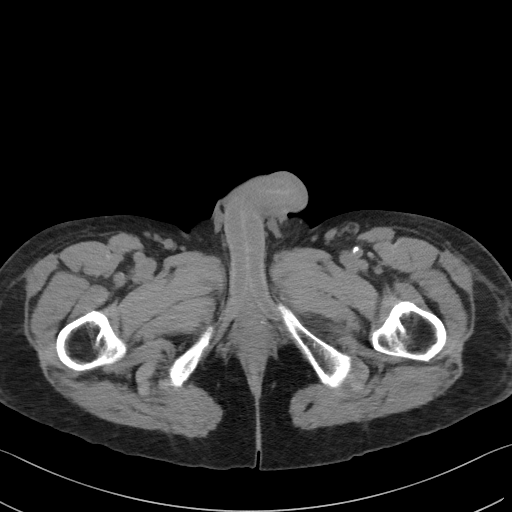
[im 4/90  bone]
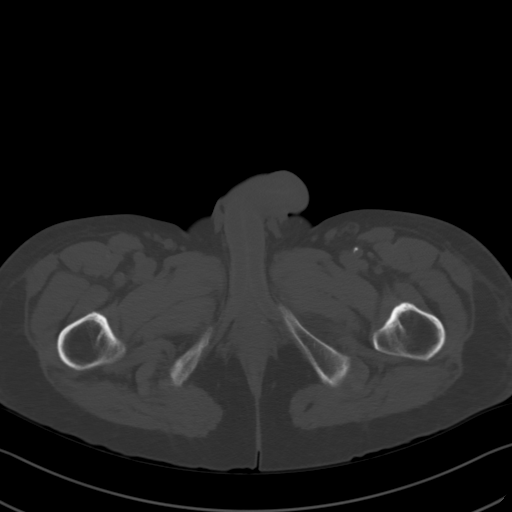
[im 12/90  soft-tissue]
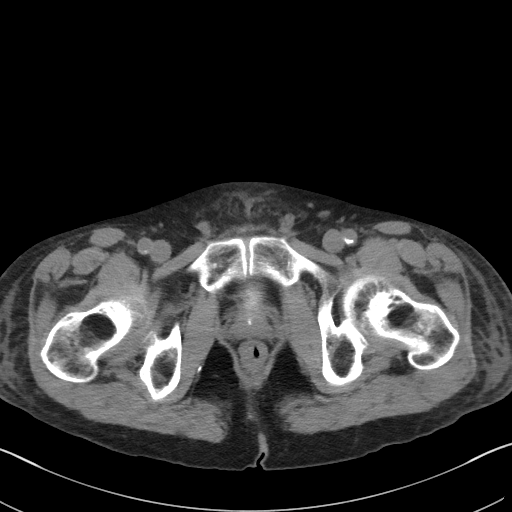
[im 19/90  soft-tissue]
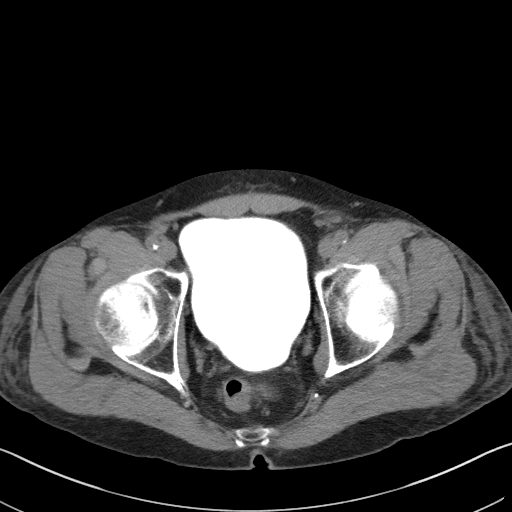
[im 26/90  soft-tissue]
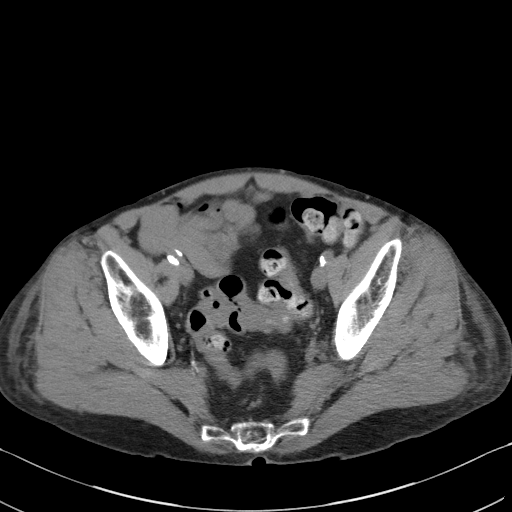
[im 30/90  soft-tissue]
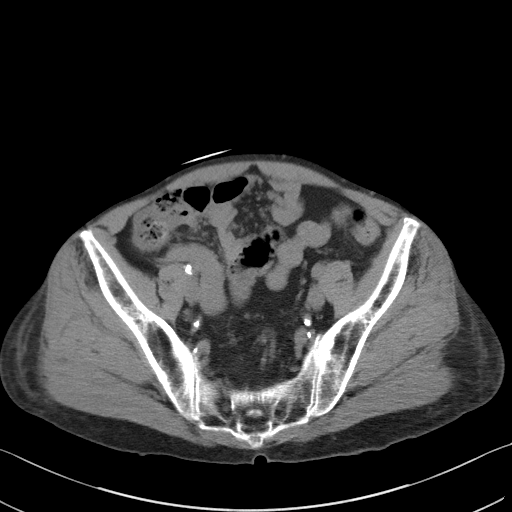
[im 38/90  soft-tissue]
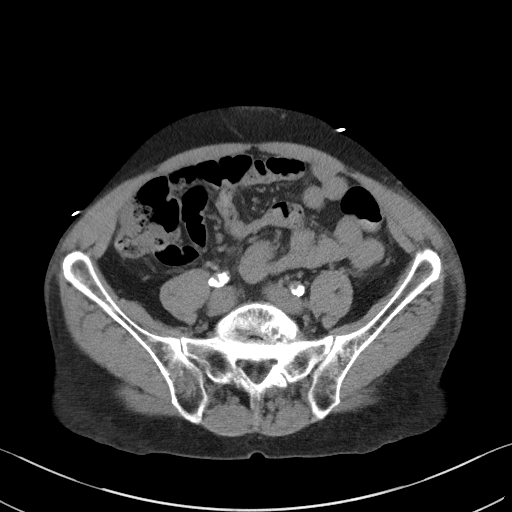
[im 45/90  soft-tissue]
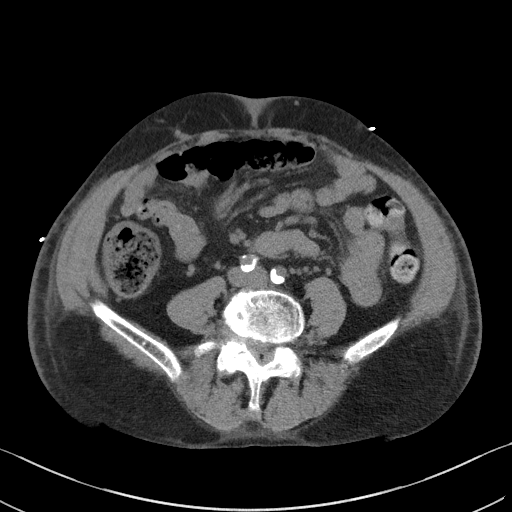
[im 52/90  soft-tissue]
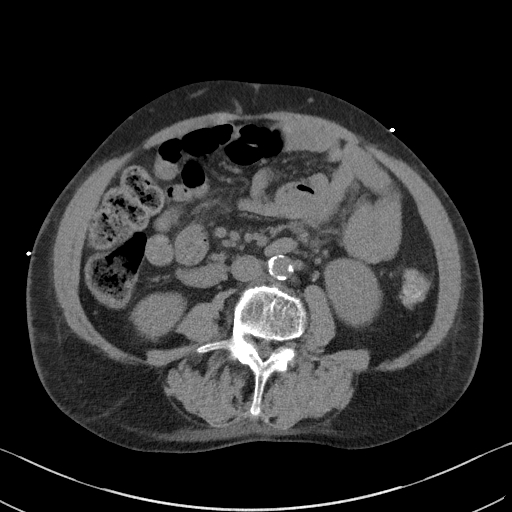
[im 60/90  soft-tissue]
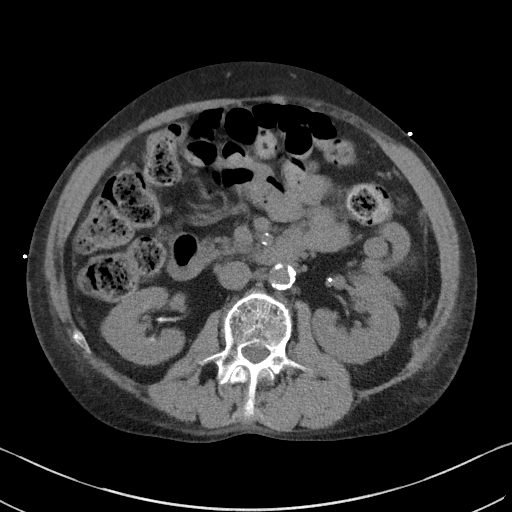
[im 60/90  bone]
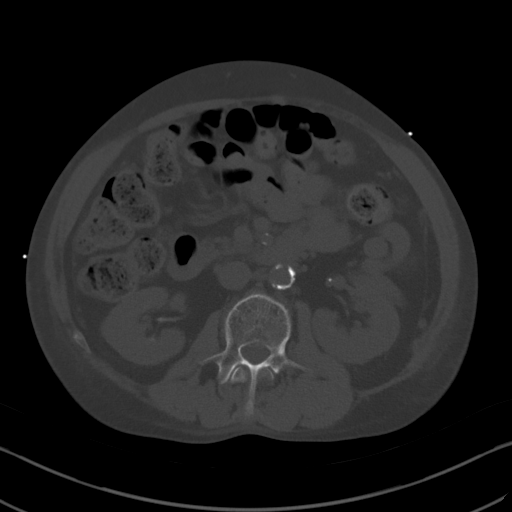
[im 64/90  soft-tissue]
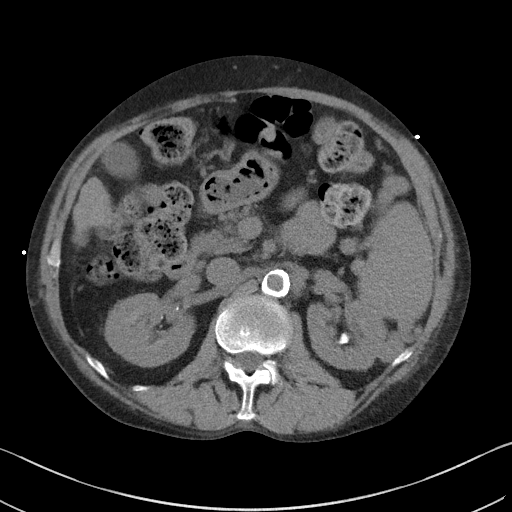
[im 71/90  soft-tissue]
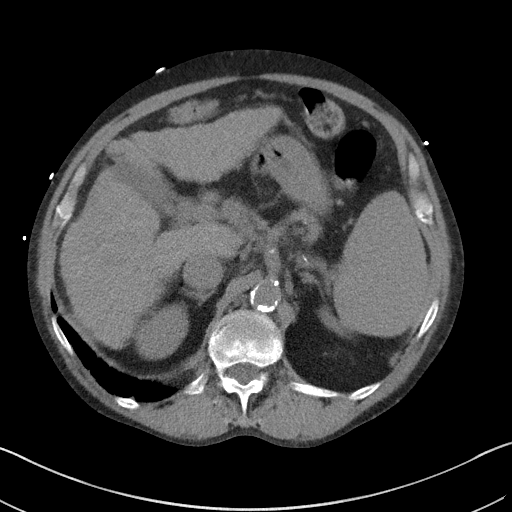
[im 78/90  soft-tissue]
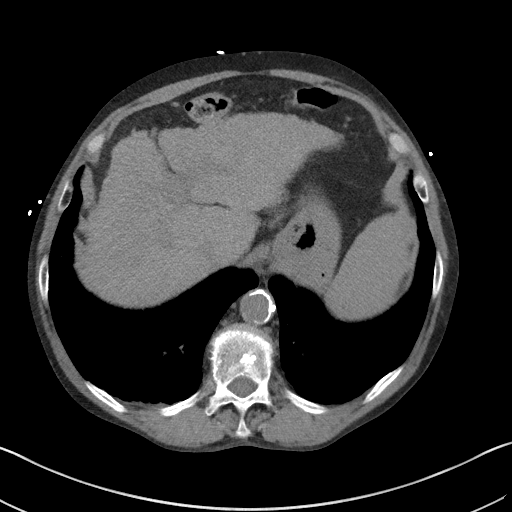
[im 86/90  soft-tissue]
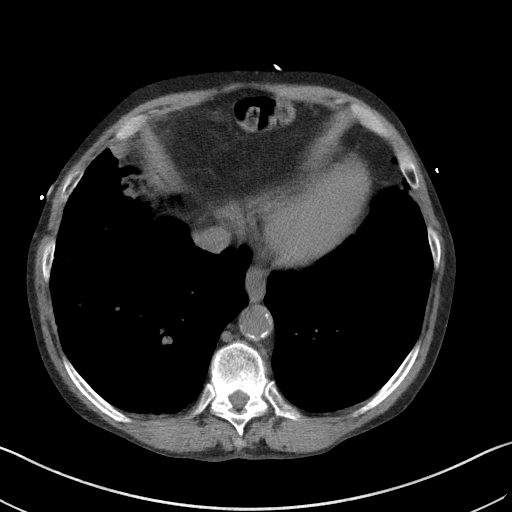

[Series 5: coronal st · coronal · 0.69mm/px · 3 of 94 slices shown]
[im 32/94  soft-tissue]
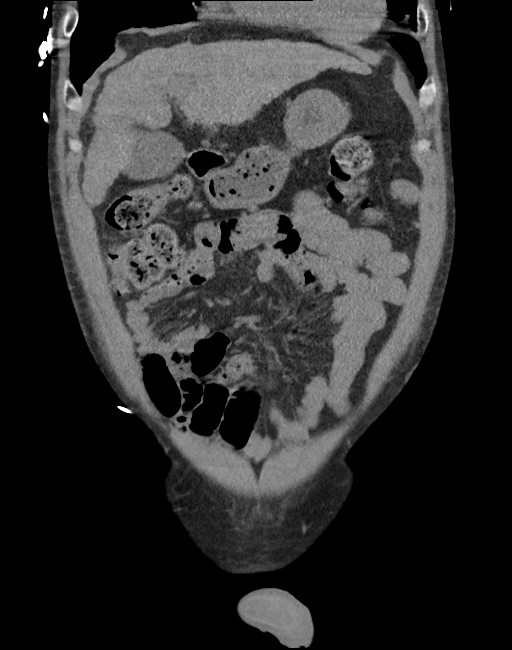
[im 42/94  soft-tissue]
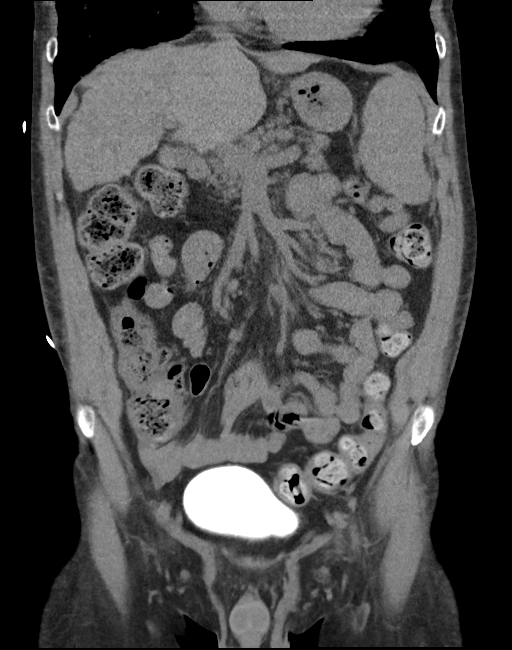
[im 52/94  soft-tissue]
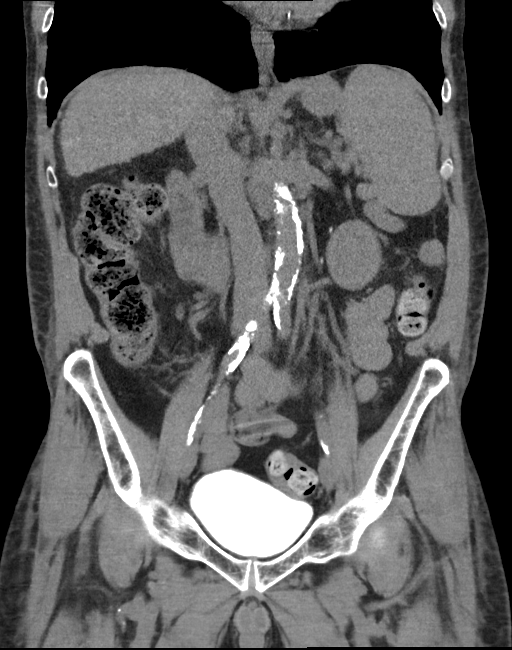

[16 of 46 positions shown; findings below may reference images not displayed]

FINDINGS: Lower chest: Emphysema. Unchanged ground-glass densities within the
right middle and lower lobes.

Hepatobiliary: Cirrhosis. No focal liver abnormality. Small
gallstones again noted. No gallbladder wall thickening or biliary
dilatation.

Pancreas: Mild atrophy. No ductal dilatation or surrounding
inflammatory changes.

Spleen: Normal in size without focal abnormality.

Adrenals/Urinary Tract: The adrenal glands are unremarkable. No
focal renal lesion. Small amount of residual contrast within the
bilateral renal collecting systems. No hydronephrosis. Contrast
within the mildly distended bladder.

Stomach/Bowel: The stomach is within normal limits. No bowel
obstruction. Scattered small hyperdense foci and stool throughout
the rectosigmoid colon, consistent with radiopaque ingested
material. No bowel wall thickening or surrounding inflammatory
changes. Normal appendix.

Vascular/Lymphatic: Extensive aortic atherosclerosis. Perisplenic
varices again noted. No lymphadenopathy.

Reproductive: Prostate is unremarkable.

Other: No free fluid or pneumoperitoneum.

Musculoskeletal: No acute or significant osseous findings. Stable
degenerative changes of the lower lumbar spine.
IMPRESSION: 1. Scattered small hyperdense foci and stool throughout the
rectosigmoid colon on this noncontrast study, most consistent with
radiopaque ingested material.
2. Multifocal pneumonia at the right lung base, unchanged.
3. Cirrhosis with sequelae of portal hypertension.
4. Cholelithiasis.
5.  Aortic atherosclerosis (YN3D8-1P6.6).
6.  Emphysema (YN3D8-T9F.N).
